# Patient Record
Sex: Female | Born: 2000
Health system: Southern US, Community
[De-identification: ages and names within clinical notes are randomized; demographics above are authoritative.]

## PROBLEM LIST (undated history)

## (undated) ENCOUNTER — Ambulatory Visit

## (undated) ENCOUNTER — Encounter

## (undated) ENCOUNTER — Encounter: Attending: Gastroenterology | Primary: Gastroenterology

## (undated) ENCOUNTER — Telehealth

## (undated) ENCOUNTER — Encounter: Attending: Registered" | Primary: Registered"

## (undated) ENCOUNTER — Ambulatory Visit: Payer: BLUE CROSS/BLUE SHIELD | Attending: Registered" | Primary: Registered"

## (undated) ENCOUNTER — Ambulatory Visit: Payer: MEDICAID

## (undated) ENCOUNTER — Ambulatory Visit: Attending: Surgery | Primary: Surgery

## (undated) ENCOUNTER — Ambulatory Visit: Payer: BLUE CROSS/BLUE SHIELD | Attending: Gastroenterology | Primary: Gastroenterology

## (undated) ENCOUNTER — Ambulatory Visit: Attending: Pharmacist | Primary: Pharmacist

## (undated) DIAGNOSIS — R109 Unspecified abdominal pain: Secondary | ICD-10-CM

## (undated) DIAGNOSIS — K5090 Crohn's disease, unspecified, without complications: Secondary | ICD-10-CM

## (undated) DIAGNOSIS — N83209 Unspecified ovarian cyst, unspecified side: Secondary | ICD-10-CM

## (undated) HISTORY — PX: TIBIA FRACTURE SURGERY: SHX806

---

## 2018-12-18 HISTORY — PX: COLONOSCOPY: SHX174

## 2020-10-13 ENCOUNTER — Other Ambulatory Visit: Payer: Self-pay

## 2020-10-13 ENCOUNTER — Emergency Department (HOSPITAL_BASED_OUTPATIENT_CLINIC_OR_DEPARTMENT_OTHER)
Admission: EM | Admit: 2020-10-13 | Discharge: 2020-10-13 | Disposition: A | Discharge status: Home / Self Care | Payer: Medicaid Other | Attending: Emergency Medicine | Admitting: Emergency Medicine

## 2020-10-13 ENCOUNTER — Encounter (HOSPITAL_BASED_OUTPATIENT_CLINIC_OR_DEPARTMENT_OTHER): Payer: Self-pay | Admitting: Emergency Medicine

## 2020-10-13 DIAGNOSIS — D509 Iron deficiency anemia, unspecified: Secondary | ICD-10-CM | POA: Insufficient documentation

## 2020-10-13 DIAGNOSIS — N7093 Salpingitis and oophoritis, unspecified: Secondary | ICD-10-CM | POA: Diagnosis not present

## 2020-10-13 DIAGNOSIS — R102 Pelvic and perineal pain: Secondary | ICD-10-CM | POA: Diagnosis not present

## 2020-10-13 DIAGNOSIS — R1030 Lower abdominal pain, unspecified: Secondary | ICD-10-CM | POA: Diagnosis present

## 2020-10-13 HISTORY — DX: Unspecified abdominal pain: R10.9

## 2020-10-13 LAB — CBC WITH DIFFERENTIAL/PLATELET
Abs Immature Granulocytes: 0.07 10*3/uL (ref 0.00–0.07)
Basophils Absolute: 0.1 10*3/uL (ref 0.0–0.1)
Basophils Relative: 1 %
Eosinophils Absolute: 0.4 10*3/uL (ref 0.0–0.5)
Eosinophils Relative: 3 %
HCT: 31.6 % — ABNORMAL LOW (ref 36.0–46.0)
Hemoglobin: 9 g/dL — ABNORMAL LOW (ref 12.0–15.0)
Immature Granulocytes: 1 %
Lymphocytes Relative: 15 %
Lymphs Abs: 2 10*3/uL (ref 0.7–4.0)
MCH: 17.1 pg — ABNORMAL LOW (ref 26.0–34.0)
MCHC: 28.5 g/dL — ABNORMAL LOW (ref 30.0–36.0)
MCV: 60 fL — ABNORMAL LOW (ref 80.0–100.0)
Monocytes Absolute: 0.7 10*3/uL (ref 0.1–1.0)
Monocytes Relative: 5 %
Neutro Abs: 9.9 10*3/uL — ABNORMAL HIGH (ref 1.7–7.7)
Neutrophils Relative %: 75 %
Platelets: 864 10*3/uL — ABNORMAL HIGH (ref 150–400)
RBC: 5.27 MIL/uL — ABNORMAL HIGH (ref 3.87–5.11)
RDW: 20.4 % — ABNORMAL HIGH (ref 11.5–15.5)
Smear Review: INCREASED
WBC: 13.2 10*3/uL — ABNORMAL HIGH (ref 4.0–10.5)
nRBC: 0 % (ref 0.0–0.2)

## 2020-10-13 LAB — WET PREP, GENITAL
Sperm: NONE SEEN
Trich, Wet Prep: NONE SEEN
Yeast Wet Prep HPF POC: NONE SEEN

## 2020-10-13 LAB — COMPREHENSIVE METABOLIC PANEL
ALT: 11 U/L (ref 0–44)
AST: 16 U/L (ref 15–41)
Albumin: 3.4 g/dL — ABNORMAL LOW (ref 3.5–5.0)
Alkaline Phosphatase: 65 U/L (ref 38–126)
Anion gap: 11 (ref 5–15)
BUN: 16 mg/dL (ref 6–20)
CO2: 25 mmol/L (ref 22–32)
Calcium: 9.1 mg/dL (ref 8.9–10.3)
Chloride: 100 mmol/L (ref 98–111)
Creatinine, Ser: 0.86 mg/dL (ref 0.44–1.00)
GFR, Estimated: >60 mL/min (ref >60)
Glucose, Bld: 102 mg/dL — ABNORMAL HIGH (ref 70–99)
Potassium: 3.3 mmol/L — ABNORMAL LOW (ref 3.5–5.1)
Sodium: 136 mmol/L (ref 135–145)
Total Bilirubin: 0.2 mg/dL — ABNORMAL LOW (ref 0.3–1.2)
Total Protein: 8.8 g/dL — ABNORMAL HIGH (ref 6.5–8.1)

## 2020-10-13 LAB — URINALYSIS, ROUTINE W REFLEX MICROSCOPIC
Bilirubin Urine: NEGATIVE
Glucose, UA: NEGATIVE mg/dL
Hgb urine dipstick: NEGATIVE
Ketones, ur: NEGATIVE mg/dL
Leukocytes,Ua: NEGATIVE
Nitrite: NEGATIVE
Protein, ur: NEGATIVE mg/dL
Specific Gravity, Urine: >1.03 — ABNORMAL HIGH (ref 1.005–1.030)
pH: 6 (ref 5.0–8.0)

## 2020-10-13 LAB — PREGNANCY, URINE: Preg Test, Ur: NEGATIVE

## 2020-10-13 LAB — LIPASE, BLOOD: Lipase: 31 U/L (ref 11–51)

## 2020-10-13 MED ORDER — ONDANSETRON HCL 4 MG PO TABS: 4.0000 mg | ORAL_TABLET | Freq: Three times a day (TID) | ORAL | 0 refills | Status: DC | PRN

## 2020-10-13 MED ORDER — DOXYCYCLINE HYCLATE 100 MG PO CAPS: 100.0000 mg | ORAL_CAPSULE | Freq: Two times a day (BID) | ORAL | 0 refills | Status: DC

## 2020-10-13 MED ORDER — SODIUM CHLORIDE 0.9 % IV SOLN
1.0000 g | Freq: Once | INTRAVENOUS | Status: AC
  Administered 2020-10-13: 1 g via INTRAVENOUS
  Filled 2020-10-13: qty 10

## 2020-10-13 MED ORDER — FENTANYL CITRATE (PF) 100 MCG/2ML IJ SOLN
50.0000 ug | Freq: Once | INTRAMUSCULAR | Status: AC
  Administered 2020-10-13: 50 ug via INTRAVENOUS
  Filled 2020-10-13: qty 2

## 2020-10-13 MED ORDER — CELECOXIB 200 MG PO CAPS: 200.0000 mg | ORAL_CAPSULE | Freq: Two times a day (BID) | ORAL | 0 refills | Status: DC

## 2020-10-13 MED ORDER — DOXYCYCLINE HYCLATE 100 MG PO TABS
100.0000 mg | ORAL_TABLET | Freq: Once | ORAL | Status: AC
  Administered 2020-10-13: 100 mg via ORAL
  Filled 2020-10-13: qty 1

## 2020-10-13 MED ORDER — ONDANSETRON HCL 4 MG/2ML IJ SOLN
4.0000 mg | Freq: Once | INTRAMUSCULAR | Status: AC
  Administered 2020-10-13: 4 mg via INTRAVENOUS
  Filled 2020-10-13: qty 2

## 2020-10-13 MED ORDER — KETOROLAC TROMETHAMINE 15 MG/ML IJ SOLN
15.0000 mg | Freq: Once | INTRAMUSCULAR | Status: AC
  Administered 2020-10-13: 15 mg via INTRAVENOUS
  Filled 2020-10-13: qty 1

## 2020-10-13 MED FILL — ONDANSETRON HCL 4 MG TABLET: 4 | 3 days supply | Qty: 10 | Fill #0

## 2020-10-13 MED FILL — CELECOXIB 200 MG CAP: 200 | 10 days supply | Qty: 20 | Fill #0

## 2020-10-13 MED FILL — DOXYCYCLINE HYCLATE 100 MG: 100 | 6 days supply | Qty: 13 | Fill #0

## 2020-10-13 NOTE — Discharge Instructions (Signed)
Contact a health care provider if: Medicine does not help your pain. Your pain comes back. You have new symptoms. You have abnormal vaginal discharge or bleeding, including bleeding after menopause. You have a fever or chills. You are constipated. You have blood in your urine or stool. You have foul-smelling urine. You feel weak or light-headed. Get help right away if: You have sudden severe pain. Your pain gets steadily worse. You have severe pain along with fever, nausea, vomiting, or excessive sweating. You lose consciousness.

## 2020-10-13 NOTE — ED Provider Notes (Signed)
[facility] EMERGENCY DEPARTMENT Provider Note   CSN: [id number] Arrival date & time: 10/13/20  3646     History Chief Complaint  Patient presents with  . Abdominal Pain    Rebecca Bell is a 19 y.o. female who presents emergency department with a chief complaint of abdominal pain.  She is new to the area recently moved from [place].  She states that she has about a 2-year history of severe intermittent abdominal pain predominantly in the lower abdomen abdomen.  She has had multiple work-ups with OB/GYN.  She states she has had about 5 CT scans that have been negative.  The patient has had about 1 week of abdominal pain which is worse on the left side but across the entire lower abdomen.  She denies any urinary symptoms but does have pain with defecation.  She states that her stools are soft however that she does not feel that she is completely emptying her bowel.  She states that she does not drink a lot of water and does not have a high-fiber diet or exercise.  She has not been sexually active in multiple months.  She denies a history of pelvic trauma, STI, surgical abortion.  She denies a history of surgeries to her abdomen.  She has had some nausea but denies fevers or chills.  She has no vaginal symptoms.  She has been taking Aleve which does help her pain.  Her sister who is at bedside states that she awoke this morning in tears.  Sister also states that their mother died of renal failure and so they're always a little bit concerned that she may have an issue with her kidneys or urinary system.  HPI     Past Medical History:  Diagnosis Date  . Abdominal pain     There are no problems to display for this patient.   Past Surgical History:  Procedure Laterality Date  . TIBIA FRACTURE SURGERY       OB History   No obstetric history on file.     No family history on file.  Social History   Tobacco Use  . Smoking status: Never Smoker  . Smokeless tobacco: Never  Used  Substance Use Topics  . Alcohol use: Yes    Comment: occ  . Drug use: Not on file    Comment: last time 1 month ago    Home Medications Prior to Admission medications   Not on File    Allergies    Patient has no known allergies.  Review of Systems   Review of Systems Ten systems reviewed and are negative for acute change, except as noted in the HPI.   Physical Exam Updated Vital Signs BP 116/79 (BP Location: Right Arm)   Pulse 89   Temp 98.4 F (36.9 C) (Oral)   Resp 16   Ht 5' 5"  (1.651 m)   Wt 50.8 kg   LMP 09/23/2020   SpO2 100%   BMI 18.64 kg/m   Physical Exam Vitals and nursing note reviewed.  Constitutional:      General: She is not in acute distress.    Appearance: She is well-developed. She is not diaphoretic.  HENT:     Head: Normocephalic and atraumatic.  Eyes:     General: No scleral icterus.    Conjunctiva/sclera: Conjunctivae normal.  Cardiovascular:     Rate and Rhythm: Normal rate and regular rhythm.     Heart sounds: Normal heart sounds. No murmur heard.  No  friction rub. No gallop.   Pulmonary:     Effort: Pulmonary effort is normal. No respiratory distress.     Breath sounds: Normal breath sounds.  Abdominal:     General: Bowel sounds are normal. There is no distension.     Palpations: Abdomen is soft. There is no mass.     Tenderness: There is abdominal tenderness in the right lower quadrant, suprapubic area and left lower quadrant. There is guarding.  Musculoskeletal:     Cervical back: Normal range of motion.     Comments: Pelvic exam: VULVA: normal appearing vulva with no masses, tenderness or lesions, VAGINA: vaginal tenderness, vaginal discharge - white and creamy, CERVIX: normal appearing cervix without discharge or lesions, cervical motion tenderness present, UTERUS: uterus is tender and full, ADNEXA: tenderness bilateral and fullness, exam limited by pain.   Skin:    General: Skin is warm and dry.  Neurological:     Mental  Status: She is alert and oriented to person, place, and time.  Psychiatric:        Behavior: Behavior normal.     ED Results / Procedures / Treatments   Labs (all labs ordered are listed, but only abnormal results are displayed) Labs Reviewed  COMPREHENSIVE METABOLIC PANEL - Abnormal; Notable for the following components:      Result Value   Potassium 3.3 (*)    Glucose, Bld 102 (*)    Total Protein 8.8 (*)    Albumin 3.4 (*)    Total Bilirubin 0.2 (*)    All other components within normal limits  LIPASE, BLOOD  CBC WITH DIFFERENTIAL/PLATELET  URINALYSIS, ROUTINE W REFLEX MICROSCOPIC  PREGNANCY, URINE    EKG None  Radiology No results found.  Procedures Procedures (including critical care time)  Medications Ordered in ED Medications  ketorolac (TORADOL) 15 MG/ML injection 15 mg (15 mg Intravenous Given 10/13/20 0949)    ED Course  I have reviewed the triage vital signs and the nursing notes.  Pertinent labs & imaging results that were available during my care of the patient were reviewed by me and considered in my medical decision making (see chart for details).    MDM Rules/Calculators/A&P                          CC: Pelvic/abdominal pain VS:  Vitals:   10/13/20 0919 10/13/20 0923  BP:  116/79  Pulse:  89  Resp:  16  Temp:  98.4 F (36.9 C)  TempSrc:  Oral  SpO2:  100%  Weight: 50.8 kg   Height: 5' 5"  (1.651 m)     YB:OFBPZWC is gathered by patient and sister at bedside. Previous records obtained and reviewed. DDX:The patient's complaint of pelvic pain involves an extensive number of diagnostic and treatment options, and is a complaint that carries with it a high risk of complications, morbidity, and potential mortality. Given the large differential diagnosis, medical decision making is of high complexity. Differential diagnosis of her lower abdominal considerations include pelvic inflammatory disease, ectopic pregnancy, appendicitis, urinary calculi,  primary dysmenorrhea, septic abortion, ruptured ovarian cyst or tumor, ovarian torsion, tubo-ovarian abscess, degeneration of fibroid, endometriosis, diverticulitis, cystitis.  Labs: I ordered reviewed and interpreted labs which include CBC which is elevated white blood cell count, microcytic anemia with hemoglobin of 9.0, markedly elevated platelet count, abnormal red cell morphology including acanthocytes target cells spherocytes and ovalocytes. Urinalysis without evidence of infection.  CMP shows mild hypokalemia of insignificant  value.  Wet prep shows clue cells and white blood cells present. Imaging: I ordered and reviewed images which included outpatient pelvic ultrasound ordered. I independently visualized and interpreted all imaging. EKG: Consults:Dr. Alvy Bimler I discussed the case with Dr. Simeon Craft such regarding patient's abnormal labs.  She feels that in the setting of her severe pelvic pain and cervical motion tenderness she likely has elevated platelet count as acute phase reactant secondly this may be elevated due to her iron deficiency anemia.  She is offered to see the patient in clinic this coming Friday at 12 PM for follow-up on her lab values.  Patient agrees. MDM: Patient here with severe pelvic pain, given her clinical findings we will treat her for pelvic inflammatory disease.  She was given a gram of IV Rocephin and will be discharged on a week of doxycycline.  Pain improved here in the emergency department.  The patient also has abnormal blood cell findings will have close follow-up with hematology.  Unfortunately our ultrasound tech had an doctor's appointment between the hours of 11 and 3 today here in the emergency department.  I discussed this with the patient and offered her 1 to wait here in the emergency department, to to be transferred to another ED for emergent ultrasound, 3 to return this afternoon between 3 and 5 to have her ultrasound done.  Patient has chosen the third choice  does not wish to stay any longer.  Do not feel that she has high risk for ovarian torsion given her global pain.  She appears otherwise appropriate for discharge at this time. Patient disposition:The patient appears reasonably screened and/or stabilized for discharge and I doubt any other medical condition or other [facility] requiring further screening, evaluation, or treatment in the ED at this time prior to discharge. I have discussed lab and/or imaging findings with the patient and answered all questions/concerns to the best of my ability.I have discussed return precautions and OP follow up.    Final Clinical Impression(s) / ED Diagnoses Final diagnoses:  Pelvic pain  Iron deficiency anemia, unspecified iron deficiency anemia type    Rx / DC Orders ED Discharge Orders    None       Margarita Mail, PA-C 10/13/20 1918    Margette Fast, MD 10/14/20 [phone]

## 2020-10-13 NOTE — ED Notes (Signed)
ED Provider at bedside. 

## 2020-10-13 NOTE — ED Triage Notes (Signed)
LLQ pain and tenderness for a week.  Getting worse.  No vomiting or diarrhea.  Denies vaginal issues.

## 2020-10-14 ENCOUNTER — Encounter (HOSPITAL_BASED_OUTPATIENT_CLINIC_OR_DEPARTMENT_OTHER): Payer: Self-pay | Admitting: *Deleted

## 2020-10-14 ENCOUNTER — Other Ambulatory Visit: Payer: Self-pay

## 2020-10-14 ENCOUNTER — Emergency Department (HOSPITAL_BASED_OUTPATIENT_CLINIC_OR_DEPARTMENT_OTHER)
Admission: RE | Admit: 2020-10-14 | Discharge: 2020-10-14 | Disposition: A | Discharge status: Home / Self Care | Payer: Medicaid Other | Source: Ambulatory Visit | Attending: Emergency Medicine | Admitting: Emergency Medicine

## 2020-10-14 ENCOUNTER — Telehealth: Payer: Self-pay | Admitting: Hematology and Oncology

## 2020-10-14 ENCOUNTER — Inpatient Hospital Stay (HOSPITAL_BASED_OUTPATIENT_CLINIC_OR_DEPARTMENT_OTHER)
Admission: EM | Admit: 2020-10-14 | Discharge: 2020-10-20 | DRG: 759 | Disposition: A | Discharge status: Home / Self Care | Payer: Medicaid Other | Attending: Obstetrics and Gynecology | Admitting: Obstetrics and Gynecology

## 2020-10-14 DIAGNOSIS — Z79899 Other long term (current) drug therapy: Secondary | ICD-10-CM

## 2020-10-14 DIAGNOSIS — D75839 Thrombocytosis, unspecified: Secondary | ICD-10-CM | POA: Diagnosis present

## 2020-10-14 DIAGNOSIS — D509 Iron deficiency anemia, unspecified: Secondary | ICD-10-CM | POA: Diagnosis not present

## 2020-10-14 DIAGNOSIS — Z20822 Contact with and (suspected) exposure to covid-19: Secondary | ICD-10-CM | POA: Diagnosis present

## 2020-10-14 DIAGNOSIS — N7093 Salpingitis and oophoritis, unspecified: Secondary | ICD-10-CM | POA: Diagnosis present

## 2020-10-14 DIAGNOSIS — D649 Anemia, unspecified: Secondary | ICD-10-CM | POA: Diagnosis present

## 2020-10-14 LAB — CBC WITH DIFFERENTIAL/PLATELET
Abs Immature Granulocytes: 0.03 10*3/uL (ref 0.00–0.07)
Basophils Absolute: 0.1 10*3/uL (ref 0.0–0.1)
Basophils Relative: 1 %
Eosinophils Absolute: 0.3 10*3/uL (ref 0.0–0.5)
Eosinophils Relative: 3 %
HCT: 27.5 % — ABNORMAL LOW (ref 36.0–46.0)
Hemoglobin: 7.8 g/dL — ABNORMAL LOW (ref 12.0–15.0)
Immature Granulocytes: 0 %
Lymphocytes Relative: 16 %
Lymphs Abs: 1.4 10*3/uL (ref 0.7–4.0)
MCH: 17.2 pg — ABNORMAL LOW (ref 26.0–34.0)
MCHC: 28.4 g/dL — ABNORMAL LOW (ref 30.0–36.0)
MCV: 60.6 fL — ABNORMAL LOW (ref 80.0–100.0)
Monocytes Absolute: 0.7 10*3/uL (ref 0.1–1.0)
Monocytes Relative: 8 %
Neutro Abs: 6.3 10*3/uL (ref 1.7–7.7)
Neutrophils Relative %: 72 %
Platelets: 814 10*3/uL — ABNORMAL HIGH (ref 150–400)
RBC: 4.54 MIL/uL (ref 3.87–5.11)
RDW: 19.7 % — ABNORMAL HIGH (ref 11.5–15.5)
Smear Review: INCREASED
WBC: 8.7 10*3/uL (ref 4.0–10.5)
nRBC: 0 % (ref 0.0–0.2)

## 2020-10-14 LAB — BASIC METABOLIC PANEL
Anion gap: 10 (ref 5–15)
BUN: 10 mg/dL (ref 6–20)
CO2: 26 mmol/L (ref 22–32)
Calcium: 8.6 mg/dL — ABNORMAL LOW (ref 8.9–10.3)
Chloride: 98 mmol/L (ref 98–111)
Creatinine, Ser: 0.72 mg/dL (ref 0.44–1.00)
GFR, Estimated: >60 mL/min (ref >60)
Glucose, Bld: 92 mg/dL (ref 70–99)
Potassium: 3.3 mmol/L — ABNORMAL LOW (ref 3.5–5.1)
Sodium: 134 mmol/L — ABNORMAL LOW (ref 135–145)

## 2020-10-14 LAB — RESP PANEL BY RT PCR (RSV, FLU A&B, COVID)
Influenza A by PCR: NEGATIVE
Influenza B by PCR: NEGATIVE
Respiratory Syncytial Virus by PCR: NEGATIVE
SARS Coronavirus 2 by RT PCR: NEGATIVE

## 2020-10-14 LAB — GC/CHLAMYDIA PROBE AMP (~~LOC~~) NOT AT ARMC
Chlamydia: NEGATIVE
Comment: NEGATIVE
Comment: NORMAL
Neisseria Gonorrhea: NEGATIVE

## 2020-10-14 MED ORDER — SODIUM CHLORIDE 0.9 % IV SOLN
1.0000 g | Freq: Once | INTRAVENOUS | Status: AC
  Administered 2020-10-14: 1 g via INTRAVENOUS
  Filled 2020-10-14: qty 10

## 2020-10-14 MED ORDER — DOXYCYCLINE HYCLATE 100 MG PO TABS
100.0000 mg | ORAL_TABLET | Freq: Two times a day (BID) | ORAL | Status: DC
  Administered 2020-10-14 – 2020-10-20 (×12): 100 mg via ORAL
  Filled 2020-10-14 (×12): qty 1

## 2020-10-14 MED ORDER — SODIUM CHLORIDE 0.9 % IV SOLN
2.0000 g | Freq: Four times a day (QID) | INTRAVENOUS | Status: DC
  Administered 2020-10-14 – 2020-10-17 (×12): 2 g via INTRAVENOUS
  Filled 2020-10-14 (×16): qty 2

## 2020-10-14 MED ORDER — SODIUM CHLORIDE 0.9 % IV SOLN
100.0000 mg | Freq: Once | INTRAVENOUS | Status: AC
  Administered 2020-10-14: 100 mg via INTRAVENOUS
  Filled 2020-10-14 (×2): qty 100

## 2020-10-14 MED ORDER — OXYCODONE-ACETAMINOPHEN 5-325 MG PO TABS
1.0000 | ORAL_TABLET | ORAL | Status: DC | PRN
  Administered 2020-10-15: 2 via ORAL
  Administered 2020-10-15 – 2020-10-16 (×3): 1 via ORAL
  Administered 2020-10-16 – 2020-10-17 (×2): 2 via ORAL
  Administered 2020-10-17 (×2): 1 via ORAL
  Administered 2020-10-18 – 2020-10-19 (×5): 2 via ORAL
  Administered 2020-10-20: 1 via ORAL
  Administered 2020-10-20: 2 via ORAL
  Filled 2020-10-14 (×2): qty 1
  Filled 2020-10-14: qty 2
  Filled 2020-10-14: qty 1
  Filled 2020-10-14 (×2): qty 2
  Filled 2020-10-14 (×3): qty 1
  Filled 2020-10-14: qty 2
  Filled 2020-10-14: qty 1
  Filled 2020-10-14 (×3): qty 2
  Filled 2020-10-14 (×3): qty 1
  Filled 2020-10-14: qty 2
  Filled 2020-10-14: qty 1

## 2020-10-14 MED ORDER — PRENATAL MULTIVITAMIN CH
1.0000 | ORAL_TABLET | Freq: Every day | ORAL | Status: DC
  Administered 2020-10-15 – 2020-10-17 (×3): 1 via ORAL
  Filled 2020-10-14 (×4): qty 1

## 2020-10-14 MED ORDER — OXYCODONE-ACETAMINOPHEN 5-325 MG PO TABS
1.0000 | ORAL_TABLET | Freq: Once | ORAL | Status: AC
  Administered 2020-10-14: 1 via ORAL
  Filled 2020-10-14: qty 1

## 2020-10-14 MED ORDER — IBUPROFEN 600 MG PO TABS
600.0000 mg | ORAL_TABLET | Freq: Four times a day (QID) | ORAL | Status: DC
  Administered 2020-10-14 – 2020-10-17 (×10): 600 mg via ORAL
  Filled 2020-10-14 (×11): qty 1

## 2020-10-14 NOTE — ED Notes (Signed)
Pt's sister brought meal to visitor, ok per provider

## 2020-10-14 NOTE — ED Notes (Signed)
Ambulated to bathroom with steady gait

## 2020-10-14 NOTE — H&P (Signed)
Rebecca Bell is an 19 y.o. female admitted for the inpatient management of a TOA. Patient reports a 1-week history of left lower quadrant pain. Patient describes the pain as a sharp pain that waxes and wanes. She denies any nausea, emesis, fever or chills. Patient is without any other complaints. Patient reports a long standing history of abdominal pain with work up by [employer] in [place]. Patient reports normal CT scans in the past.     Past Medical History:  Diagnosis Date  . Abdominal pain     Past Surgical History:  Procedure Laterality Date  . TIBIA FRACTURE SURGERY      No family history on file.  Social History:  reports that she has never smoked. She has never used smokeless tobacco. She reports current alcohol use.  Drug: Marijuana.  Allergies: No Known Allergies  Medications Prior to Admission  Medication Sig Dispense Refill Last Dose  . celecoxib (CELEBREX) 200 MG capsule Take 1 capsule (200 mg total) by mouth 2 (two) times daily. 20 capsule 0   . doxycycline (VIBRAMYCIN) 100 MG capsule Take 1 capsule (100 mg total) by mouth 2 (two) times daily. For the next 6 days. Your first dose was given in the ER. Please take your second dose tonight at bedtime with food. 13 capsule 0   . ondansetron (ZOFRAN) 4 MG tablet Take 1 tablet (4 mg total) by mouth every 8 (eight) hours as needed for nausea or vomiting. 10 tablet 0     Review of Systems See pertinent in HPI. All other systems reviewed and negative Blood pressure 113/71, pulse 81, temperature 98 F (36.7 C), temperature source Oral, resp. rate 17, height 5' 5"  (1.651 m), weight 50.8 kg, last menstrual period 09/23/2020, SpO2 100 %. Physical Exam GENERAL: Well-developed, well-nourished female in no acute distress.  HEENT: Normocephalic, atraumatic. Sclerae anicteric.  NECK: Supple. Normal thyroid.  LUNGS: Clear to auscultation bilaterally.  HEART: Regular rate and rhythm. BREASTS: Symmetric in size. No palpable masses or  lymphadenopathy, skin changes, or nipple drainage. ABDOMEN: Soft, diffuse lower abdominal tenderness, nondistended. No rebound, no guarding.  EXTREMITIES: No cyanosis, clubbing, or edema, 2+ distal pulses.  Results for orders placed or performed during the hospital encounter of 10/14/20 (from the past 24 hour(s))  CBC with Differential     Status: Abnormal   Collection Time: 10/14/20  3:01 PM  Result Value Ref Range   WBC 8.7 4.0 - 10.5 K/uL   RBC 4.54 3.87 - 5.11 MIL/uL   Hemoglobin 7.8 (L) 12.0 - 15.0 g/dL   HCT 27.5 (L) 36 - 46 %   MCV 60.6 (L) 80.0 - 100.0 fL   MCH 17.2 (L) 26.0 - 34.0 pg   MCHC 28.4 (L) 30.0 - 36.0 g/dL   RDW 19.7 (H) 11.5 - 15.5 %   Platelets 814 (H) 150 - 400 K/uL   nRBC 0.0 0.0 - 0.2 %   Neutrophils Relative % 72 %   Neutro Abs 6.3 1.7 - 7.7 K/uL   Lymphocytes Relative 16 %   Lymphs Abs 1.4 0.7 - 4.0 K/uL   Monocytes Relative 8 %   Monocytes Absolute 0.7 0.1 - 1.0 K/uL   Eosinophils Relative 3 %   Eosinophils Absolute 0.3 0.0 - 0.5 K/uL   Basophils Relative 1 %   Basophils Absolute 0.1 0.0 - 0.1 K/uL   WBC Morphology MORPHOLOGY UNREMARKABLE    Smear Review PLATELETS APPEAR INCREASED    Immature Granulocytes 0 %   Abs Immature Granulocytes  0.03 0.00 - 0.07 K/uL   Acanthocytes PRESENT    Target Cells PRESENT    Ovalocytes PRESENT   Basic metabolic panel     Status: Abnormal   Collection Time: 10/14/20  3:01 PM  Result Value Ref Range   Sodium 134 (L) 135 - 145 mmol/L   Potassium 3.3 (L) 3.5 - 5.1 mmol/L   Chloride 98 98 - 111 mmol/L   CO2 26 22 - 32 mmol/L   Glucose, Bld 92 70 - 99 mg/dL   BUN 10 6 - 20 mg/dL   Creatinine, Ser 0.72 0.44 - 1.00 mg/dL   Calcium 8.6 (L) 8.9 - 10.3 mg/dL   GFR, Estimated >60 >60 mL/min   Anion gap 10 5 - 15  Resp Panel by RT PCR (RSV, Flu A&B, Covid) - Nasopharyngeal Swab     Status: None   Collection Time: 10/14/20  3:01 PM   Specimen: Nasopharyngeal Swab  Result Value Ref Range   SARS Coronavirus 2 by RT PCR  NEGATIVE NEGATIVE   Influenza A by PCR NEGATIVE NEGATIVE   Influenza B by PCR NEGATIVE NEGATIVE   Respiratory Syncytial Virus by PCR NEGATIVE NEGATIVE    US PELVIC DOPPLER (TORSION R/O OR MASS ARTERIAL FLOW)  Result Date: 10/14/2020 CLINICAL DATA:  Severe pelvic pain for 1 week. Last menstrual period 09/23/2020. EXAM: TRANSABDOMINAL ULTRASOUND OF PELVIS DOPPLER ULTRASOUND OF OVARIES TECHNIQUE: Transabdominal ultrasound examination of the pelvis was performed including evaluation of the uterus, ovaries, adnexal regions, and pelvic cul-de-sac. Color and duplex Doppler ultrasound was utilized to evaluate blood flow to the ovaries. COMPARISON:  None. FINDINGS: Measurements: 6.3 x 2.4 x 3.4 cm = volume: 27 mL. No fibroids or other mass visualized. Endometrium Thickness: 3 mm.  No focal abnormality visualized. Right ovary Measurements: 5.9 x 4.1 x 4.3 cm = volume: 54 mL. A right adnexal mass has multiple thin septations and measures 10.0 x 6.1 x 7.2 cm. Left ovary Measurements: 5.0 x 4.7 x 5.3 cm = volume: 65 mL. The left ovary has a heterogeneous appearance. Pulsed Doppler evaluation demonstrates normal low-resistance arterial and venous waveforms in both ovaries. Other findings: A heterogeneous soft tissue mass superior to the uterus measures 8.0 x 4.8 x 8.6 cm and demonstrates internal blood flow. Small volume free fluid is seen in the pelvis. IMPRESSION: 1. Enlarged and heterogeneous appearing left ovary may reflect infection or torsion. 2. Large cystic mass in the vicinity of the right adnexa may reflect hydrosalpinx, pyosalpinx, or an adnexal cyst with multiple thin septations. 3. Heterogeneous soft tissue mass superior to the uterus is nonspecific and may reflect a tubo-ovarian abscess. These results were called by telephone at the time of interpretation on 10/14/2020 at 2:12 pm to the patient's provider, who verbally acknowledged these results. Electronically Signed   By: Zerita Boers M.D.   On:  10/14/2020 14:12    Assessment/Plan: 19 yo with right TOA - Admit for IV antibiotics - pain management as needed - Consider IR drainage if no improvement in her symptoms  [name] 10/14/2020, 6:53 PM

## 2020-10-14 NOTE — ED Provider Notes (Signed)
[facility] EMERGENCY DEPARTMENT Provider Note   CSN: [id number] Arrival date & time: 10/14/20  1421     History Chief Complaint  Patient presents with  . Abdominal Pain    Rebecca Bell is a 19 y.o. female.  HPI    19 year old female presenting to the emergency department today for evaluation of abdominal pain and abnormal ultrasound.  Patient was seen in the emergency department yesterday for evaluation of abdominal pain.  Has history of chronic diffuse abdominal pain but for the last week pain has been new and different and located to the bilateral lower abdomen.  Pain is waxing and waning in nature.  She currently rates pain 4/10.  She is been taking over-the-counter medications with some mild relief.  She denies any associated nausea, vomiting, diarrhea, urinary or vaginal complaints.  She has had some pain with defecation.  Denies any fevers.  She has had an extensive GI and OB/GYN work-up for her chronic abdominal pain in the past including EGD, colonoscopy and pelvic ultrasound which she states were all normal.   Patient was evaluated yesterday for her symptoms in the emergency department and had an ultrasound ordered that resulted today and was abnormal so she was sent to the ED for further evaluation.  Past Medical History:  Diagnosis Date  . Abdominal pain     Patient Active Problem List   Diagnosis Date Noted  . Tubo-ovarian abscess 10/14/2020    Past Surgical History:  Procedure Laterality Date  . TIBIA FRACTURE SURGERY       OB History   No obstetric history on file.     No family history on file.  Social History   Tobacco Use  . Smoking status: Never Smoker  . Smokeless tobacco: Never Used  Substance Use Topics  . Alcohol use: Yes    Comment: occ  . Drug use: Not on file    Comment: last time 1 month ago    Home Medications Prior to Admission medications   Medication Sig Start Date End Date Taking? Authorizing Provider  celecoxib  (CELEBREX) 200 MG capsule Take 1 capsule (200 mg total) by mouth 2 (two) times daily. 10/13/20   Margarita Mail, PA-C  doxycycline (VIBRAMYCIN) 100 MG capsule Take 1 capsule (100 mg total) by mouth 2 (two) times daily. For the next 6 days. Your first dose was given in the ER. Please take your second dose tonight at bedtime with food. 10/13/20   Harris, Abigail, PA-C  ondansetron (ZOFRAN) 4 MG tablet Take 1 tablet (4 mg total) by mouth every 8 (eight) hours as needed for nausea or vomiting. 10/13/20   Margarita Mail, PA-C    Allergies    Patient has no known allergies.  Review of Systems   Review of Systems  Constitutional: Negative for chills and fever.  HENT: Negative for ear pain and sore throat.   Eyes: Negative for visual disturbance.  Respiratory: Negative for cough and shortness of breath.   Cardiovascular: Negative for chest pain.  Gastrointestinal: Positive for abdominal pain. Negative for constipation, diarrhea, nausea and vomiting.  Genitourinary: Positive for pelvic pain. Negative for dysuria and hematuria.  Musculoskeletal: Negative for back pain.  Skin: Negative for rash.  Neurological: Negative for headaches.  All other systems reviewed and are negative.   Physical Exam Updated Vital Signs BP 125/69   Pulse 98   Temp 98.2 F (36.8 C) (Oral)   Resp 20   Ht 5' 5"  (1.651 m)   Wt 50.8  kg   LMP 09/23/2020   SpO2 100%   BMI 18.64 kg/m   Physical Exam Vitals and nursing note reviewed.  Constitutional:      General: She is not in acute distress.    Appearance: She is well-developed.  HENT:     Head: Normocephalic and atraumatic.  Eyes:     Conjunctiva/sclera: Conjunctivae normal.  Cardiovascular:     Rate and Rhythm: Normal rate and regular rhythm.     Heart sounds: Normal heart sounds. No murmur heard.   Pulmonary:     Effort: Pulmonary effort is normal. No respiratory distress.     Breath sounds: Normal breath sounds. No wheezing, rhonchi or rales.    Abdominal:     General: Bowel sounds are normal.     Palpations: Abdomen is soft.     Tenderness: There is abdominal tenderness in the right lower quadrant, suprapubic area and left lower quadrant. There is guarding. There is no rebound.  Genitourinary:    Comments: Deferred. Pt had pelvic examination yesterday. Musculoskeletal:     Cervical back: Neck supple.  Skin:    General: Skin is warm and dry.  Neurological:     Mental Status: She is alert.     ED Results / Procedures / Treatments   Labs (all labs ordered are listed, but only abnormal results are displayed) Labs Reviewed  RESP PANEL BY RT PCR (RSV, FLU A&B, COVID)  CBC WITH DIFFERENTIAL/PLATELET  BASIC METABOLIC PANEL    EKG None  Radiology US PELVIC DOPPLER (TORSION R/O OR MASS ARTERIAL FLOW)  Result Date: 10/14/2020 CLINICAL DATA:  Severe pelvic pain for 1 week. Last menstrual period 09/23/2020. EXAM: TRANSABDOMINAL ULTRASOUND OF PELVIS DOPPLER ULTRASOUND OF OVARIES TECHNIQUE: Transabdominal ultrasound examination of the pelvis was performed including evaluation of the uterus, ovaries, adnexal regions, and pelvic cul-de-sac. Color and duplex Doppler ultrasound was utilized to evaluate blood flow to the ovaries. COMPARISON:  None. FINDINGS: Measurements: 6.3 x 2.4 x 3.4 cm = volume: 27 mL. No fibroids or other mass visualized. Endometrium Thickness: 3 mm.  No focal abnormality visualized. Right ovary Measurements: 5.9 x 4.1 x 4.3 cm = volume: 54 mL. A right adnexal mass has multiple thin septations and measures 10.0 x 6.1 x 7.2 cm. Left ovary Measurements: 5.0 x 4.7 x 5.3 cm = volume: 65 mL. The left ovary has a heterogeneous appearance. Pulsed Doppler evaluation demonstrates normal low-resistance arterial and venous waveforms in both ovaries. Other findings: A heterogeneous soft tissue mass superior to the uterus measures 8.0 x 4.8 x 8.6 cm and demonstrates internal blood flow. Small volume free fluid is seen in the pelvis.  IMPRESSION: 1. Enlarged and heterogeneous appearing left ovary may reflect infection or torsion. 2. Large cystic mass in the vicinity of the right adnexa may reflect hydrosalpinx, pyosalpinx, or an adnexal cyst with multiple thin septations. 3. Heterogeneous soft tissue mass superior to the uterus is nonspecific and may reflect a tubo-ovarian abscess. These results were called by telephone at the time of interpretation on 10/14/2020 at 2:12 pm to the patient's provider, who verbally acknowledged these results. Electronically Signed   By: Zerita Boers M.D.   On: 10/14/2020 14:12    Procedures Procedures (including critical care time)  Medications Ordered in ED Medications  cefTRIAXone (ROCEPHIN) 1 g in sodium chloride 0.9 % 100 mL IVPB (has no administration in time range)  doxycycline (VIBRAMYCIN) 100 mg in sodium chloride 0.9 % 250 mL IVPB (has no administration in time range)  oxyCODONE-acetaminophen (PERCOCET/ROXICET) 5-325 MG per tablet 1 tablet (has no administration in time range)    ED Course  I have reviewed the triage vital signs and the nursing notes.  Pertinent labs & imaging results that were available during my care of the patient were reviewed by me and considered in my medical decision making (see chart for details).    MDM Rules/Calculators/A&P                          19 year old female presenting for evaluation of 1 week history of waxing and waning pelvic pain.  No other associated GI/GU complaints except for pain with defecation.  Pelvic ultrasound yesterday revealed cervical motion tenderness and bilateral adnexal tenderness.  She was given ceftriaxone in the ED and started on a course of doxycycline to treat PID.  Pelvic US report shows:  1. Enlarged and heterogeneous appearing left ovary may reflect infection or torsion. 2. Large cystic mass in the vicinity of the right adnexa may reflect hydrosalpinx, pyosalpinx, or an adnexal cyst with multiple thin septations. 3.  Heterogeneous soft tissue mass superior to the uterus is nonspecific and may reflect a tubo-ovarian abscess.  Reviewed labs from yesterday which shows   2:49 PM CONSULT with Dr. Roselie Awkward with OB-GYN. He recommends IV abx and he will admit the patient.   Pt given ceftriaxone and doxycycline in the ED. Also given analgesics. Discussed plan to admission and she is in agreement. Will be transferred to [facility].    Final Clinical Impression(s) / ED Diagnoses Final diagnoses:  Tubo-ovarian abscess    Rx / DC Orders ED Discharge Orders    None       Rodney Booze, PA-C 10/14/20 [place], [place], DO 10/18/20 2300

## 2020-10-14 NOTE — Telephone Encounter (Signed)
A new hem appt has been scheduled for Ms. Thang to see Dr. Alvy Bimler on 10/29 at 12pm. Pt aware to arrive 30 minutes early.

## 2020-10-14 NOTE — ED Triage Notes (Signed)
She was seen here yesterday for LLQ pain for a week. We scheduled her for an out pt [place]. She had the [place] this am that is abnormal and required her to be readmitted to the ED.

## 2020-10-15 ENCOUNTER — Inpatient Hospital Stay: Payer: Self-pay | Admitting: Hematology and Oncology

## 2020-10-15 MED ORDER — POLYETHYLENE GLYCOL 3350 17 G PO PACK
17.0000 g | PACK | Freq: Once | ORAL | Status: AC
  Administered 2020-10-15: 17 g via ORAL
  Filled 2020-10-15: qty 1

## 2020-10-15 NOTE — Progress Notes (Signed)
Subjective:low abdominal pain, feels she needs to have BM but is uncomfortable Patient reports good appetite, no nausea  Objective: I have reviewed patient's vital signs, medications, labs and radiology results. Blood pressure 110/70, pulse 80, temperature 98 F (36.7 C), temperature source Oral, resp. rate 17, height 5' 5"  (1.651 m), weight 50.8 kg, last menstrual period 09/23/2020, SpO2 100 %.  General: alert, cooperative and no distress Resp: normal effort GI: soft, non-tender; bowel sounds normal; no masses,  no organomegaly Extremities: extremities normal, atraumatic, no cyanosis or edema   Assessment/Plan: Treated for likely TOA, discussed that IR may be consulted if she does not improve after 24-48 hr on IV ABX Miralax for constipation   LOS: 1 day    Emeterio Reeve 10/15/2020, 11:27 AM

## 2020-10-16 MED ORDER — DIPHENHYDRAMINE HCL 25 MG PO CAPS
25.0000 mg | ORAL_CAPSULE | Freq: Four times a day (QID) | ORAL | Status: DC | PRN
  Administered 2020-10-16 – 2020-10-20 (×10): 25 mg via ORAL
  Filled 2020-10-16 (×10): qty 1

## 2020-10-17 ENCOUNTER — Inpatient Hospital Stay (HOSPITAL_COMMUNITY): Payer: Medicaid Other

## 2020-10-17 ENCOUNTER — Encounter (HOSPITAL_COMMUNITY): Payer: Self-pay | Admitting: Obstetrics and Gynecology

## 2020-10-17 DIAGNOSIS — D649 Anemia, unspecified: Secondary | ICD-10-CM | POA: Diagnosis present

## 2020-10-17 DIAGNOSIS — D75839 Thrombocytosis, unspecified: Secondary | ICD-10-CM | POA: Diagnosis present

## 2020-10-17 DIAGNOSIS — R1900 Intra-abdominal and pelvic swelling, mass and lump, unspecified site: Secondary | ICD-10-CM

## 2020-10-17 LAB — CBC
HCT: 26.6 % — ABNORMAL LOW (ref 36.0–46.0)
HCT: 31.4 % — ABNORMAL LOW (ref 36.0–46.0)
Hemoglobin: 7.2 g/dL — ABNORMAL LOW (ref 12.0–15.0)
Hemoglobin: 8.7 g/dL — ABNORMAL LOW (ref 12.0–15.0)
MCH: 16.7 pg — ABNORMAL LOW (ref 26.0–34.0)
MCH: 16.9 pg — ABNORMAL LOW (ref 26.0–34.0)
MCHC: 27.1 g/dL — ABNORMAL LOW (ref 30.0–36.0)
MCHC: 27.7 g/dL — ABNORMAL LOW (ref 30.0–36.0)
MCV: 61.6 fL — ABNORMAL LOW (ref 80.0–100.0)
MCV: 60.9 fL — ABNORMAL LOW (ref 80.0–100.0)
Platelets: 758 10*3/uL — ABNORMAL HIGH (ref 150–400)
Platelets: 919 10*3/uL (ref 150–400)
RBC: 4.32 MIL/uL (ref 3.87–5.11)
RBC: 5.16 MIL/uL — ABNORMAL HIGH (ref 3.87–5.11)
RDW: 19.4 % — ABNORMAL HIGH (ref 11.5–15.5)
RDW: 20.1 % — ABNORMAL HIGH (ref 11.5–15.5)
WBC: 9.3 10*3/uL (ref 4.0–10.5)
WBC: 10.2 10*3/uL (ref 4.0–10.5)
nRBC: 0 % (ref 0.0–0.2)
nRBC: 0 % (ref 0.0–0.2)

## 2020-10-17 MED ORDER — POLYSACCHARIDE IRON COMPLEX 150 MG PO CAPS
150.0000 mg | ORAL_CAPSULE | Freq: Every day | ORAL | Status: DC
  Administered 2020-10-17: 150 mg via ORAL
  Filled 2020-10-17 (×2): qty 1

## 2020-10-17 MED ORDER — POTASSIUM CHLORIDE CRYS ER 20 MEQ PO TBCR
20.0000 meq | EXTENDED_RELEASE_TABLET | Freq: Two times a day (BID) | ORAL | Status: AC
  Administered 2020-10-17 – 2020-10-19 (×6): 20 meq via ORAL
  Filled 2020-10-17 (×6): qty 1

## 2020-10-17 MED ORDER — ENOXAPARIN SODIUM 40 MG/0.4ML ~~LOC~~ SOLN
40.0000 mg | SUBCUTANEOUS | Status: DC
  Administered 2020-10-17: 40 mg via SUBCUTANEOUS
  Filled 2020-10-17: qty 0.4

## 2020-10-17 MED ORDER — POLYETHYLENE GLYCOL 3350 17 G PO PACK
17.0000 g | PACK | Freq: Every day | ORAL | Status: DC
  Filled 2020-10-17: qty 1

## 2020-10-17 MED ORDER — METRONIDAZOLE 500 MG PO TABS
500.0000 mg | ORAL_TABLET | Freq: Two times a day (BID) | ORAL | Status: DC
  Administered 2020-10-17 – 2020-10-20 (×6): 500 mg via ORAL
  Filled 2020-10-17 (×6): qty 1

## 2020-10-17 MED ORDER — IBUPROFEN 600 MG PO TABS
600.0000 mg | ORAL_TABLET | Freq: Four times a day (QID) | ORAL | Status: DC | PRN
  Filled 2020-10-17: qty 1

## 2020-10-17 MED ORDER — GADOBUTROL 1 MMOL/ML IV SOLN
5.0000 mL | Freq: Once | INTRAVENOUS | Status: AC | PRN
  Administered 2020-10-17: 5 mL via INTRAVENOUS

## 2020-10-17 NOTE — Progress Notes (Signed)
Name: Samella Lucchetti Date: 10/17/2020 MRN: [id number]  DOB: [birth year]  Primary Care Physician: Patient, No Pcp Per  Referring Physician: No ref. provider found  HISTORY OF PRESENT ILLNESS: pt continues to have lower abd pain, tolerated PO, denies subjective fever/chills.   PAST MEDICAL HISTORY: Past Medical History:  Diagnosis Date  . Abdominal pain     PAST SURGICAL HISTORY:  Past Surgical History:  Procedure Laterality Date  . TIBIA FRACTURE SURGERY      FAMILY HISTORY: No family history on file.  SOCIAL HISTORY:  reports that she has never smoked. She has never used smokeless tobacco. She reports current alcohol use.  Drug: Marijuana.  ALLERGIES:  has No Known Allergies.  MEDICATIONS:  Current Facility-Administered Medications  Medication Dose Route Frequency Provider Last Rate Last Admin  . cefOXitin (MEFOXIN) 2 g in sodium chloride 0.9 % 100 mL IVPB  2 g Intravenous Q6H Constant, Peggy, MD   Stopped at 10/17/20 0044  . diphenhydrAMINE (BENADRYL) capsule 25 mg  25 mg Oral Q6H PRN Griffin Basil, MD   25 mg at 10/17/20 0153  . doxycycline (VIBRA-TABS) tablet 100 mg  100 mg Oral Q12H Constant, Peggy, MD   100 mg at 10/16/20 2231  . ibuprofen (ADVIL) tablet 600 mg  600 mg Oral Q6H Constant, Peggy, MD   600 mg at 10/17/20 0153  . oxyCODONE-acetaminophen (PERCOCET/ROXICET) 5-325 MG per tablet 1-2 tablet  1-2 tablet Oral Q3H PRN Constant, Peggy, MD   2 tablet at 10/16/20 2232  . prenatal multivitamin tablet 1 tablet  1 tablet Oral Q1200 Constant, Peggy, MD   1 tablet at 10/16/20 1214     PHYSICAL EXAMINATION: BP 100/62 (BP Location: Right Arm)   Pulse 92   Temp 97.7 F (36.5 C) (Oral)   Resp 18   Ht 5' 5"  (1.651 m)   Wt 50.8 kg   LMP 09/23/2020   SpO2 100%   BMI 18.64 kg/m    System Exam: General appearance: alert and cooperative Head: Normocephalic, without obvious abnormality, atraumatic GI: soft, non-tender; bowel sounds normal; no masses,  no  organomegaly Extremities: extremities normal, atraumatic, no cyanosis or edema  LABORATORY DATA: Results for orders placed or performed during the hospital encounter of 10/14/20 (from the past 72 hour(s))  CBC with Differential     Status: Abnormal   Collection Time: 10/14/20  3:01 PM  Result Value Ref Range   WBC 8.7 4.0 - 10.5 K/uL   RBC 4.54 3.87 - 5.11 MIL/uL   Hemoglobin 7.8 (L) 12.0 - 15.0 g/dL    Comment: Reticulocyte Hemoglobin testing may be clinically indicated, consider ordering this additional test [id number]    HCT 27.5 (L) 36 - 46 %   MCV 60.6 (L) 80.0 - 100.0 fL   MCH 17.2 (L) 26.0 - 34.0 pg   MCHC 28.4 (L) 30.0 - 36.0 g/dL   RDW 19.7 (H) 11.5 - 15.5 %   Platelets 814 (H) 150 - 400 K/uL    Comment: REPEATED TO VERIFY   nRBC 0.0 0.0 - 0.2 %   Neutrophils Relative % 72 %   Neutro Abs 6.3 1.7 - 7.7 K/uL   Lymphocytes Relative 16 %   Lymphs Abs 1.4 0.7 - 4.0 K/uL   Monocytes Relative 8 %   Monocytes Absolute 0.7 0.1 - 1.0 K/uL   Eosinophils Relative 3 %   Eosinophils Absolute 0.3 0.0 - 0.5 K/uL   Basophils Relative 1 %   Basophils Absolute 0.1 0.0 - 0.1 K/uL  WBC Morphology MORPHOLOGY UNREMARKABLE    Smear Review PLATELETS APPEAR INCREASED     Comment: PLATELET COUNT CONFIRMED BY SMEAR   Immature Granulocytes 0 %   Abs Immature Granulocytes 0.03 0.00 - 0.07 K/uL   Acanthocytes PRESENT    Target Cells PRESENT    Ovalocytes PRESENT     Comment: Performed at [facility], [address] [place], [place] [address]  Basic metabolic panel     Status: Abnormal   Collection Time: 10/14/20  3:01 PM  Result Value Ref Range   Sodium 134 (L) 135 - 145 mmol/L   Potassium 3.3 (L) 3.5 - 5.1 mmol/L   Chloride 98 98 - 111 mmol/L   CO2 26 22 - 32 mmol/L   Glucose, Bld 92 70 - 99 mg/dL    Comment: Glucose reference range applies only to samples taken after fasting for at least 8 hours.   BUN 10 6 - 20 mg/dL   Creatinine, Ser 0.72 0.44 - 1.00 mg/dL   Calcium  8.6 (L) 8.9 - 10.3 mg/dL   GFR, Estimated >60 >60 mL/min    Comment: (NOTE) Calculated using the CKD-EPI Creatinine Equation (2021)    Anion gap 10 5 - 15    Comment: Performed at [facility], [address] [place], [place] [address]  Resp Panel by RT PCR (RSV, Flu A&B, Covid) - Nasopharyngeal Swab     Status: None   Collection Time: 10/14/20  3:01 PM   Specimen: Nasopharyngeal Swab  Result Value Ref Range   SARS Coronavirus 2 by RT PCR NEGATIVE NEGATIVE    Comment: (NOTE) SARS-CoV-2 target nucleic acids are NOT DETECTED.  The SARS-CoV-2 RNA is generally detectable in upper respiratoy specimens during the acute phase of infection. The lowest concentration of SARS-CoV-2 viral copies this assay can detect is 131 copies/mL. A negative result does not preclude SARS-Cov-2 infection and should not be used as the sole basis for treatment or other patient management decisions. A negative result may occur with  improper specimen collection/handling, submission of specimen other than nasopharyngeal swab, presence of viral mutation(s) within the areas targeted by this assay, and inadequate number of viral copies (<131 copies/mL). A negative result must be combined with clinical observations, patient history, and epidemiological information. The expected result is Negative.  Fact Sheet for Patients:  PinkCheek.be  Fact Sheet for Healthcare Providers:  GravelBags.it  This test is no t yet approved or cleared by the [place] FDA and  has been authorized for detection and/or diagnosis of SARS-CoV-2 by FDA under an Emergency Use Authorization (EUA). This EUA will remain  in effect (meaning this test can be used) for the duration of the COVID-19 declaration under Section 564(b)(1) of the Act, 21 U.S.C. section 360bbb-3(b)(1), unless the authorization is terminated or revoked sooner.     Influenza A by PCR NEGATIVE  NEGATIVE   Influenza B by PCR NEGATIVE NEGATIVE    Comment: (NOTE) The Xpert Xpress SARS-CoV-2/FLU/RSV assay is intended as an aid in  the diagnosis of influenza from Nasopharyngeal swab specimens and  should not be used as a sole basis for treatment. Nasal washings and  aspirates are unacceptable for Xpert Xpress SARS-CoV-2/FLU/RSV  testing.  Fact Sheet for Patients: PinkCheek.be  Fact Sheet for Healthcare Providers: GravelBags.it  This test is not yet approved or cleared by the [place] FDA and  has been authorized for detection and/or diagnosis of SARS-CoV-2 by  FDA under an Emergency Use  Authorization (EUA). This EUA will remain  in effect (meaning this test can be used) for the duration of the  Covid-19 declaration under Section 564(b)(1) of the Act, 21  U.S.C. section 360bbb-3(b)(1), unless the authorization is  terminated or revoked.    Respiratory Syncytial Virus by PCR NEGATIVE NEGATIVE    Comment: (NOTE) Fact Sheet for Patients: PinkCheek.be  Fact Sheet for Healthcare Providers: GravelBags.it  This test is not yet approved or cleared by the [place] FDA and  has been authorized for detection and/or diagnosis of SARS-CoV-2 by  FDA under an Emergency Use Authorization (EUA). This EUA will remain  in effect (meaning this test can be used) for the duration of the  COVID-19 declaration under Section 564(b)(1) of the Act, 21 U.S.C.  section 360bbb-3(b)(1), unless the authorization is terminated or  revoked. Performed at [facility], [address] [place], [place] [address]      RADIOGRAPHIC STUDIES: US PELVIC DOPPLER (TORSION R/O OR MASS ARTERIAL FLOW)  Result Date: 10/14/2020 CLINICAL DATA:  Severe pelvic pain for 1 week. Last menstrual period 09/23/2020. EXAM: TRANSABDOMINAL ULTRASOUND OF PELVIS DOPPLER ULTRASOUND OF OVARIES  TECHNIQUE: Transabdominal ultrasound examination of the pelvis was performed including evaluation of the uterus, ovaries, adnexal regions, and pelvic cul-de-sac. Color and duplex Doppler ultrasound was utilized to evaluate blood flow to the ovaries. COMPARISON:  None. FINDINGS: Measurements: 6.3 x 2.4 x 3.4 cm = volume: 27 mL. No fibroids or other mass visualized. Endometrium Thickness: 3 mm.  No focal abnormality visualized. Right ovary Measurements: 5.9 x 4.1 x 4.3 cm = volume: 54 mL. A right adnexal mass has multiple thin septations and measures 10.0 x 6.1 x 7.2 cm. Left ovary Measurements: 5.0 x 4.7 x 5.3 cm = volume: 65 mL. The left ovary has a heterogeneous appearance. Pulsed Doppler evaluation demonstrates normal low-resistance arterial and venous waveforms in both ovaries. Other findings: A heterogeneous soft tissue mass superior to the uterus measures 8.0 x 4.8 x 8.6 cm and demonstrates internal blood flow. Small volume free fluid is seen in the pelvis. IMPRESSION: 1. Enlarged and heterogeneous appearing left ovary may reflect infection or torsion. 2. Large cystic mass in the vicinity of the right adnexa may reflect hydrosalpinx, pyosalpinx, or an adnexal cyst with multiple thin septations. 3. Heterogeneous soft tissue mass superior to the uterus is nonspecific and may reflect a tubo-ovarian abscess. These results were called by telephone at the time of interpretation on 10/14/2020 at 2:12 pm to the patient's provider, who verbally acknowledged these results. Electronically Signed   By: Zerita Boers M.D.   On: 10/14/2020 14:12     ASSESSMENT:  18yo with TOA apprxo 8-9 cm   PLAN: Continue IV abx, follow CBC, may re image tomorrow and possibly discuss IR drain placement.  LOS: 2 day

## 2020-10-17 NOTE — Progress Notes (Signed)
CRITICAL VALUE ALERT  Critical Value:  Platelet 919  Date & Time Notied: 10/17/2020 @ 0180  Provider Notified: Ilda Basset  Orders Received/Actions taken: orders placed

## 2020-10-17 NOTE — Progress Notes (Signed)
GYN Note D/w radiology and difficult to interpret exactly what's going; the radiologist on call is actually who read the initial u/s. He recommended either repeat u/s or a pelvic MRI.   Will start daily iron, flagyl 500 bid, lovenox 40 qday and d/c cefoxitin. Pelvic mri ordered for today or tomorrow. Her cbc came back better on it's own. Should be fine to go tomorrow pending still doing fine on just po abx and after mri is done  Can also d/w hematology tomorrow re: her very elevated plt count  Durene Romans MD Attending Center for [place] ([place]) [place] Phone: [phone] (M-F, 0800-1700) & [phone] (Off hours, weekends, holidays) Time: 1810

## 2020-10-17 NOTE — Progress Notes (Addendum)
Gynecology Progress Note  Admission Date: 10/14/2020 Current Date: 10/17/2020 10:25 AM  Rebecca Bell is a 19 y.o. G0 HD#4 admitted for abdominal pain, presumed TOA on imaging   History complicated by: Patient Active Problem List   Diagnosis Date Noted  . Tubo-ovarian abscess 10/14/2020  . TOA (tubo-ovarian abscess) 10/14/2020    ROS and patient/family/surgical history, located on admission H&P note dated 10/14/2020, have been reviewed, and there are no changes except as noted below Yesterday/Overnight Events:  none  Subjective:  Pain feels better this morning; achy all the time but sharp pains come and go.  No s/s of anemia, VB. +BM, ambulation, voiding. No nausea, vomiting, fevers, chills.   Objective:    Current Vital Signs 24h Vital Sign Ranges  T (!) 97.5 F (36.4 C) Temp  Avg: 97.8 F (36.6 C)  Min: 97.5 F (36.4 C)  Max: 98.3 F (36.8 C)  BP (!) 102/59 BP  Min: 96/52  Max: 102/59  HR 72 Pulse  Avg: 84  Min: 72  Max: 92  RR 16 Resp  Avg: 16.7  Min: 16  Max: 18  SaO2 100 % Room Air SpO2  Avg: 100 %  Min: 100 %  Max: 100 %       24 Hour I/O Current Shift I/O  Time Ins Outs 10/30 0701 - 10/31 0700 In: 1300 [P.O.:900] Out: -  No intake/output data recorded.    Physical exam: General appearance: alert, cooperative and appears stated age Abdomen: +BS, soft, ND. Minimally ttp in suprapubic area, no peritoneal s/s Lungs: clear to auscultation bilaterally Heart: S1, S2 normal, no murmur, rub or gallop, regular rate and rhythm Extremities: no c/c/e Skin: warm and dry Psych: appropriate Neurologic: Grossly normal  Medications Current Facility-Administered Medications  Medication Dose Route Frequency Provider Last Rate Last Admin  . cefOXitin (MEFOXIN) 2 g in sodium chloride 0.9 % 100 mL IVPB  2 g Intravenous Q6H Constant, Peggy, MD   Stopped at 10/17/20 0600  . diphenhydrAMINE (BENADRYL) capsule 25 mg  25 mg Oral Q6H PRN Griffin Basil, MD   25 mg at 10/17/20 0850   . doxycycline (VIBRA-TABS) tablet 100 mg  100 mg Oral Q12H Constant, Peggy, MD   100 mg at 10/17/20 0846  . ibuprofen (ADVIL) tablet 600 mg  600 mg Oral Q6H Constant, Peggy, MD   600 mg at 10/17/20 0846  . oxyCODONE-acetaminophen (PERCOCET/ROXICET) 5-325 MG per tablet 1-2 tablet  1-2 tablet Oral Q3H PRN Constant, Peggy, MD   1 tablet at 10/17/20 0850  . potassium chloride SA (KLOR-CON) CR tablet 20 mEq  20 mEq Oral BID Aletha Halim, MD      . prenatal multivitamin tablet 1 tablet  1 tablet Oral Q1200 Constant, Peggy, MD   1 tablet at 10/16/20 1214      Labs  Recent Labs  Lab 10/13/20 0943 10/14/20 1501 10/17/20 0532  WBC 13.2* 8.7 9.3  HGB 9.0* 7.8* 7.2*  HCT 31.6* 27.5* 26.6*  PLT 864* 814* 758*    Recent Labs  Lab 10/13/20 0943 10/14/20 1501  NA 136 134*  K 3.3* 3.3*  CL 100 98  CO2 25 26  BUN 16 10  CREATININE 0.86 0.72  CALCIUM 9.1 8.6*  PROT 8.8*  --   BILITOT 0.2*  --   ALKPHOS 65  --   ALT 11  --   AST 16  --   GLUCOSE 102* 92    Radiology No new imaging.   Narrative &  Impression  CLINICAL DATA:  Severe pelvic pain for 1 week. Last menstrual period 09/23/2020.  EXAM: TRANSABDOMINAL ULTRASOUND OF PELVIS  DOPPLER ULTRASOUND OF OVARIES  TECHNIQUE: Transabdominal ultrasound examination of the pelvis was performed including evaluation of the uterus, ovaries, adnexal regions, and pelvic cul-de-sac.  Color and duplex Doppler ultrasound was utilized to evaluate blood flow to the ovaries.  COMPARISON:  None.  FINDINGS: Measurements: 6.3 x 2.4 x 3.4 cm = volume: 27 mL. No fibroids or other mass visualized.  Endometrium  Thickness: 3 mm.  No focal abnormality visualized.  Right ovary  Measurements: 5.9 x 4.1 x 4.3 cm = volume: 54 mL. A right adnexal mass has multiple thin septations and measures 10.0 x 6.1 x 7.2 cm.  Left ovary  Measurements: 5.0 x 4.7 x 5.3 cm = volume: 65 mL. The left ovary has a heterogeneous  appearance.  Pulsed Doppler evaluation demonstrates normal low-resistance arterial and venous waveforms in both ovaries.  Other findings: A heterogeneous soft tissue mass superior to the uterus measures 8.0 x 4.8 x 8.6 cm and demonstrates internal blood flow. Small volume free fluid is seen in the pelvis.  IMPRESSION: 1. Enlarged and heterogeneous appearing left ovary may reflect infection or torsion.  2. Large cystic mass in the vicinity of the right adnexa may reflect hydrosalpinx, pyosalpinx, or an adnexal cyst with multiple thin septations.  3. Heterogeneous soft tissue mass superior to the uterus is nonspecific and may reflect a tubo-ovarian abscess.  These results were called by telephone at the time of interpretation on 10/14/2020 at 2:12 pm to the patient's provider, who verbally acknowledged these results.   Electronically Signed   By: Zerita Boers M.D.   On: 10/14/2020 14:12       Assessment & Plan:  Pt stable *GYN: continue cefoxitin and doxy. See below. I told her that I'm not sure if she has a TOA or potentially a hemorrhagic cyst. If H/H is stable, then may just need IV iron and may reimage now vs as an outpatient. If H/H is down more, then she may need repeat imaging (CT vs MRI) vs going to the OR to see exactly what these two masses are.  -negative GC/CT, wet prep, UPT *Anemia: H/H keeps dropping. No s/s of anemia. Rpt ordered for 1300 today.  *Pain: controlled with PO PRNs *FEN/GI: SLIV, regular diet *PPx: SCDs *Dispo: potentially tomorrow  Code Status: Full Code  Total time taking care of the patient was 25 minutes, with greater than 50% of the time spent in face to face interaction with the patient.  Durene Romans MD Attending Center for [place] ([facility] G[facility] Consult Phone: [phone] (M-F, 0800-1700) & [phone] (Off hours, weekends, holidays)

## 2020-10-18 ENCOUNTER — Inpatient Hospital Stay (HOSPITAL_COMMUNITY): Payer: Medicaid Other

## 2020-10-18 DIAGNOSIS — D75839 Thrombocytosis, unspecified: Secondary | ICD-10-CM

## 2020-10-18 DIAGNOSIS — D649 Anemia, unspecified: Secondary | ICD-10-CM

## 2020-10-18 LAB — CBC WITH DIFFERENTIAL/PLATELET
Abs Immature Granulocytes: 0.05 10*3/uL (ref 0.00–0.07)
Basophils Absolute: 0 10*3/uL (ref 0.0–0.1)
Basophils Relative: 0 %
Eosinophils Absolute: 0.5 10*3/uL (ref 0.0–0.5)
Eosinophils Relative: 6 %
HCT: 26.3 % — ABNORMAL LOW (ref 36.0–46.0)
Hemoglobin: 7.3 g/dL — ABNORMAL LOW (ref 12.0–15.0)
Immature Granulocytes: 1 %
Lymphocytes Relative: 15 %
Lymphs Abs: 1.2 10*3/uL (ref 0.7–4.0)
MCH: 16.7 pg — ABNORMAL LOW (ref 26.0–34.0)
MCHC: 27.8 g/dL — ABNORMAL LOW (ref 30.0–36.0)
MCV: 60.3 fL — ABNORMAL LOW (ref 80.0–100.0)
Monocytes Absolute: 0.5 10*3/uL (ref 0.1–1.0)
Monocytes Relative: 7 %
Neutro Abs: 5.8 10*3/uL (ref 1.7–7.7)
Neutrophils Relative %: 71 %
Platelets: 797 10*3/uL — ABNORMAL HIGH (ref 150–400)
RBC: 4.36 MIL/uL (ref 3.87–5.11)
RDW: 19.5 % — ABNORMAL HIGH (ref 11.5–15.5)
WBC: 8.2 10*3/uL (ref 4.0–10.5)
nRBC: 0 % (ref 0.0–0.2)

## 2020-10-18 LAB — PROTIME-INR
INR: 1.1 (ref 0.8–1.2)
Prothrombin Time: 13.7 seconds (ref 11.4–15.2)

## 2020-10-18 LAB — TECHNOLOGIST SMEAR REVIEW

## 2020-10-18 LAB — ABO/RH: ABO/RH(D): O POS

## 2020-10-18 MED ORDER — IOHEXOL 300 MG/ML  SOLN
100.0000 mL | Freq: Once | INTRAMUSCULAR | Status: AC | PRN
  Administered 2020-10-18: 100 mL via INTRAVENOUS

## 2020-10-18 MED ORDER — POLYETHYLENE GLYCOL 3350 17 G PO PACK
17.0000 g | PACK | Freq: Every day | ORAL | Status: DC
  Administered 2020-10-19 – 2020-10-20 (×2): 17 g via ORAL
  Filled 2020-10-18 (×2): qty 1

## 2020-10-18 MED ORDER — LACTATED RINGERS IV SOLN: INTRAVENOUS | Status: DC

## 2020-10-18 MED ORDER — POLYSACCHARIDE IRON COMPLEX 150 MG PO CAPS
150.0000 mg | ORAL_CAPSULE | Freq: Every day | ORAL | Status: DC
  Administered 2020-10-19 – 2020-10-20 (×2): 150 mg via ORAL
  Filled 2020-10-18 (×2): qty 1

## 2020-10-18 NOTE — Progress Notes (Addendum)
Gynecology Progress Note  Admission Date: 10/14/2020 Current Date: 10/18/2020 8:24 AM  Rebecca Bell is a 19 y.o. G0 HD#5 admitted for abdominal pain, presumed TOA on imaging   History complicated by: Patient Active Problem List   Diagnosis Date Noted  . Thrombocytosis 10/17/2020  . Anemia 10/17/2020  . Tubo-ovarian abscess 10/14/2020    ROS and patient/family/surgical history, located on admission H&P note dated 10/14/2020, have been reviewed, and there are no changes except as noted below Yesterday/Overnight Events:  Pt got her MRI last night  Subjective:  Patient states pain (sharp) started again worse last night around the time of the MRI; s/s are stable. No nausea. Hasn't had any PO today. No fevers, chills  Objective:    Current Vital Signs 24h Vital Sign Ranges  T 98.4 F (36.9 C) Temp  Avg: 98.4 F (36.9 C)  Min: 98 F (36.7 C)  Max: 98.9 F (37.2 C)  BP (!) 99/58 BP  Min: 99/58  Max: 109/71  HR 91 Pulse  Avg: 82.8  Min: 71  Max: 97  RR 17 Resp  Avg: 18  Min: 17  Max: 19  SaO2 100 % Room Air SpO2  Avg: 100 %  Min: 100 %  Max: 100 %       24 Hour I/O Current Shift I/O  Time Ins Outs 10/31 0701 - 11/01 0700 In: 1100 [P.O.:900] Out: -  No intake/output data recorded.    Physical exam: General appearance: appears mildly uncomfortable Abdomen: soft, moderately ttp in the suprapubic area.  Lungs: clear to auscultation bilaterally Heart: S1, S2 normal, no murmur, rub or gallop, regular rate and rhythm Extremities: no c/c/e Skin: warm and dry Psych: appropriate Neurologic: Grossly normal  Medications Current Facility-Administered Medications  Medication Dose Route Frequency Provider Last Rate Last Admin  . diphenhydrAMINE (BENADRYL) capsule 25 mg  25 mg Oral Q6H PRN Griffin Basil, MD   25 mg at 10/18/20 0816  . doxycycline (VIBRA-TABS) tablet 100 mg  100 mg Oral Q12H Constant, Peggy, MD   100 mg at 10/18/20 0811  . enoxaparin (LOVENOX) injection 40 mg  40  mg Subcutaneous Q24H Aletha Halim, MD   40 mg at 10/17/20 2015  . ibuprofen (ADVIL) tablet 600 mg  600 mg Oral Q6H PRN Aletha Halim, MD      . Derrill Memo ON 10/19/2020] iron polysaccharides (NIFEREX) capsule 150 mg  150 mg Oral Daily Aletha Halim, MD      . lactated ringers infusion   Intravenous Continuous Aletha Halim, MD 100 mL/hr at 10/18/20 0820 New Bag at 10/18/20 0820  . metroNIDAZOLE (FLAGYL) tablet 500 mg  500 mg Oral Q12H Aletha Halim, MD   500 mg at 10/18/20 0811  . oxyCODONE-acetaminophen (PERCOCET/ROXICET) 5-325 MG per tablet 1-2 tablet  1-2 tablet Oral Q3H PRN Constant, Peggy, MD   2 tablet at 10/18/20 0811  . [START ON 10/19/2020] polyethylene glycol (MIRALAX / GLYCOLAX) packet 17 g  17 g Oral Daily Dezarae Mcclaran, Eduard Clos, MD      . potassium chloride SA (KLOR-CON) CR tablet 20 mEq  20 mEq Oral BID Aletha Halim, MD   20 mEq at 10/18/20 0811      Labs  Recent Labs  Lab 10/14/20 1501 10/17/20 0532 10/17/20 1700  WBC 8.7 9.3 10.2  HGB 7.8* 7.2* 8.7*  HCT 27.5* 26.6* 31.4*  PLT 814* 758* 919*    Recent Labs  Lab 10/13/20 0943 10/14/20 1501  NA 136 134*  K 3.3* 3.3*  CL  100 98  CO2 25 26  BUN 16 10  CREATININE 0.86 0.72  CALCIUM 9.1 8.6*  PROT 8.8*  --   BILITOT 0.2*  --   ALKPHOS 65  --   ALT 11  --   AST 16  --   GLUCOSE 102* 92    Radiology Narrative & Impression  CLINICAL DATA:  Pelvic mass on ultrasound  EXAM: MRI PELVIS WITHOUT AND WITH CONTRAST  TECHNIQUE: Multiplanar multisequence MR imaging of the pelvis was performed both before and after administration of intravenous contrast.  CONTRAST:  37m GADAVIST GADOBUTROL 1 MMOL/ML IV SOLN  COMPARISON:  Pelvic ultrasound dated 10/06/2020  FINDINGS: Urinary Tract:  Bladder is within normal limits.  Bowel: Long segment wall thickening involving the distal/terminal ileum (series 5/images 10 and 13), nonspecific.  Vascular/Lymphatic: No evidence of aneurysm.  No suspicious  pelvic lymphadenopathy.  Reproductive:  Uterus is within normal limits.  Right ovary is within normal limits.  3.8 x 3.8 x 2.9 cm peripherally enhancing left ovarian mass (series 3/image 16). No significant intrinsic T1 hyperintensity on precontrast imaging. As such, this appearance favors a tubo-ovarian abscess over a hemorrhagic corpus luteum.  Other: Moderate pelvic ascites, partially loculated with mass effect in the dependent pelvis.  Musculoskeletal: No focal osseous lesions.  IMPRESSION: 3.8 cm left ovarian mass, favoring a small tubo-ovarian abscess.  Long segment wall thickening involving the distal/terminal ileum, nonspecific and likely reactive given the left adnexal inflammatory process favored above.  Associated moderate pelvic ascites, partially loculated with mass-effect in the dependent pelvis.   Electronically Signed   By: SJulian HyM.D.   On: 10/18/2020 07:27   Narrative & Impression  CLINICAL DATA:  Severe pelvic pain for 1 week. Last menstrual period 09/23/2020.  EXAM: TRANSABDOMINAL ULTRASOUND OF PELVIS  DOPPLER ULTRASOUND OF OVARIES  TECHNIQUE: Transabdominal ultrasound examination of the pelvis was performed including evaluation of the uterus, ovaries, adnexal regions, and pelvic cul-de-sac.  Color and duplex Doppler ultrasound was utilized to evaluate blood flow to the ovaries.  COMPARISON:  None.  FINDINGS: Measurements: 6.3 x 2.4 x 3.4 cm = volume: 27 mL. No fibroids or other mass visualized.  Endometrium  Thickness: 3 mm.  No focal abnormality visualized.  Right ovary  Measurements: 5.9 x 4.1 x 4.3 cm = volume: 54 mL. A right adnexal mass has multiple thin septations and measures 10.0 x 6.1 x 7.2 cm.  Left ovary  Measurements: 5.0 x 4.7 x 5.3 cm = volume: 65 mL. The left ovary has a heterogeneous appearance.  Pulsed Doppler evaluation demonstrates normal low-resistance arterial and venous  waveforms in both ovaries.  Other findings: A heterogeneous soft tissue mass superior to the uterus measures 8.0 x 4.8 x 8.6 cm and demonstrates internal blood flow. Small volume free fluid is seen in the pelvis.  IMPRESSION: 1. Enlarged and heterogeneous appearing left ovary may reflect infection or torsion.  2. Large cystic mass in the vicinity of the right adnexa may reflect hydrosalpinx, pyosalpinx, or an adnexal cyst with multiple thin septations.  3. Heterogeneous soft tissue mass superior to the uterus is nonspecific and may reflect a tubo-ovarian abscess.  These results were called by telephone at the time of interpretation on 10/14/2020 at 2:12 pm to the patient's provider, who verbally acknowledged these results.   Electronically Signed   By: TZerita BoersM.D.   On: 10/14/2020 14:12    Assessment & Plan:  Pt stable *GYN: pt more uncomfortable today. Rpt cbc with diff ordered. ?  if adnexal mass ruptured in the interim from the u/s given moderate ascites now. May want to consider IR consult for drainage, especially if WBC increases; also may want to switch back to IV abx if WBC increases. T&S ordered. Pt is on ppx lovenox with last dose at 2015 last night and next at [place].  -s/p 4 days of cefoxitin  -negative GC/CT, wet prep, UPT *Anemia: no s/s of anemia *Plts: f/u cbc and talk to heme if still very elevated.  *Pain: controlled with PO PRNs *FEN/GI: started MIVF, pt made NPO.  *PPx: SCDs *Dispo: with improved s/s.   Code Status: Full Code  Total time taking care of the patient was 25 minutes, with greater than 50% of the time spent in face to face interaction with the patient.  Durene Romans MD [facility]ending [facility] [place] [facility] GYN Consult Phone: [phone] (M-F, 0800-1700) & [phone] (Off hours, weekends, holidays)

## 2020-10-18 NOTE — Consult Note (Signed)
Chief Complaint: Patient was seen in consultation today for intra-pelvic fluid collections/aspiration and drainage.  Referring Physician(s): Constant, Peggy (OB/GYN)  Supervising Physician: Sandi Mariscal  Patient Status: [facility] - In-pt  History of Present Illness: Rebecca Bell is a 19 y.o. female with an unremarkable past medical history who has been admitted to [facility] since 10/13/2020 for management of abdominal pain secondary to presumed TOA. She was started on IV antibiotics. Pain continued, so additional imaging was obtained which revealed progression of TOA, along with additional intra-pelvic fluid collection.  US pelvic doppler 10/14/2020: 1. Enlarged and heterogeneous appearing left ovary may reflect infection or torsion. 2. Large cystic mass in the vicinity of the right adnexa may reflect hydrosalpinx, pyosalpinx, or an adnexal cyst with multiple thin septations. 3. Heterogeneous soft tissue mass superior to the uterus is nonspecific and may reflect a tubo-ovarian abscess.  MR pelvis 10/17/2020: 1. 3.8 cm left ovarian mass, favoring a small tubo-ovarian abscess. 2. Long segment wall thickening involving the distal/terminal ileum, nonspecific and likely reactive given the left adnexal inflammatory process favored above. 3. Associated moderate pelvic ascites, partially loculated with mass-effect in the dependent pelvis.  CT pelvis 10/18/2020: 1. 3.7 cm irregular enhancing lesion in the left ovary, favoring a tubo-ovarian abscess on recent MRI. 2. Long segment wall thickening involving the distal/terminal ileum, abnormal. This appearance may be reactive given the left adnexal process, but infectious/inflammatory ileitis is not excluded. At a minimum, GI follow-up after resolution of the pelvic inflammatory process is suggested. 3. Small to moderate pelvic ascites, mildly masslike. Associated fluid/ascites along the small bowel mesentery.  IR consulted by Dr. Elly Modena for possible  image-guided intra-pelvic aspiration with possible drain placement. Patient awake and alert sitting in bed eating a jolly rancher. Complains of abdominal pain, rated 6/10 at this time. Denies N/V associated with abdominal pain. Denies fever, chills, chest pain, dyspnea, or headache.   Past Medical History:  Diagnosis Date  . Abdominal pain     Past Surgical History:  Procedure Laterality Date  . TIBIA FRACTURE SURGERY      Allergies: Patient has no known allergies.  Medications: Prior to Admission medications   Medication Sig Start Date End Date Taking? Authorizing Provider  celecoxib (CELEBREX) 200 MG capsule Take 1 capsule (200 mg total) by mouth 2 (two) times daily. Patient not taking: Reported on 10/14/2020 10/13/20   Margarita Mail, PA-C  doxycycline (VIBRAMYCIN) 100 MG capsule Take 1 capsule (100 mg total) by mouth 2 (two) times daily. For the next 6 days. Your first dose was given in the ER. Please take your second dose tonight at bedtime with food. Patient not taking: Reported on 10/14/2020 10/13/20   Margarita Mail, PA-C  ondansetron (ZOFRAN) 4 MG tablet Take 1 tablet (4 mg total) by mouth every 8 (eight) hours as needed for nausea or vomiting. Patient not taking: Reported on 10/14/2020 10/13/20   Margarita Mail, PA-C     No family history on file.  Social History   Socioeconomic History  . Marital status: Single    Spouse name: Not on file  . Number of children: Not on file  . Years of education: Not on file  . Highest education level: Not on file  Occupational History  . Not on file  Tobacco Use  . Smoking status: Never Smoker  . Smokeless tobacco: Never Used  Substance and Sexual Activity  . Alcohol use: Yes    Comment: occ  . Drug use: Not on file    Comment:  last time 1 month ago  . Sexual activity: Not on file  Other Topics Concern  . Not on file  Social History Narrative  . Not on file   Social Determinants of Health   Financial Resource  Strain:   . Difficulty of Paying Living Expenses: Not on file  Food Insecurity:   . Worried About Charity fundraiser in the Last Year: Not on file  . Ran Out of Food in the Last Year: Not on file  Transportation Needs:   . Lack of Transportation (Medical): Not on file  . Lack of Transportation (Non-Medical): Not on file  Physical Activity:   . Days of Exercise per Week: Not on file  . Minutes of Exercise per Session: Not on file  Stress:   . Feeling of Stress : Not on file  Social Connections:   . Frequency of Communication with Friends and Family: Not on file  . Frequency of Social Gatherings with Friends and Family: Not on file  . Attends Religious Services: Not on file  . Active Member of Clubs or Organizations: Not on file  . Attends Archivist Meetings: Not on file  . Marital Status: Not on file     Review of Systems: A 12 point ROS discussed and pertinent positives are indicated in the HPI above.  All other systems are negative.  Review of Systems  Constitutional: Negative for chills and fever.  Respiratory: Negative for shortness of breath and wheezing.   Cardiovascular: Negative for chest pain and palpitations.  Gastrointestinal: Positive for abdominal pain. Negative for nausea and vomiting.  Neurological: Negative for headaches.  Psychiatric/Behavioral: Negative for behavioral problems and confusion.    Vital Signs: BP (!) 99/58 (BP Location: Right Arm)   Pulse 91   Temp 98.4 F (36.9 C) (Oral)   Resp 17   Ht 5' 5"  (1.651 m)   Wt 111 lb 15.9 oz (50.8 kg)   LMP 09/23/2020   SpO2 100%   BMI 18.64 kg/m   Physical Exam Vitals and nursing note reviewed.  Constitutional:      General: She is not in acute distress.    Appearance: Normal appearance.  Cardiovascular:     Rate and Rhythm: Normal rate and regular rhythm.     Heart sounds: Normal heart sounds. No murmur heard.   Pulmonary:     Effort: Pulmonary effort is normal. No respiratory  distress.     Breath sounds: Normal breath sounds. No wheezing.  Skin:    General: Skin is warm and dry.  Neurological:     Mental Status: She is alert and oriented to person, place, and time.      MD Evaluation Airway: WNL Heart: WNL Abdomen: WNL Chest/ Lungs: WNL ASA  Classification: 2 Mallampati/Airway Score: One   Imaging: CT PELVIS W CONTRAST  Result Date: 10/18/2020 CLINICAL DATA:  Tubo-ovarian abscess EXAM: CT PELVIS WITH CONTRAST TECHNIQUE: Multidetector CT imaging of the pelvis was performed using the standard protocol following the bolus administration of intravenous contrast. CONTRAST:  158m OMNIPAQUE IOHEXOL 300 MG/ML  SOLN COMPARISON:  MR pelvis dated 10/17/2020. Ultrasound pelvis dated 10/14/2020. FINDINGS: Urinary Tract:  Bladder is within normal limits. Bowel: Long segment wall thickening involving the distal/terminal ileum (series 3/image 15), underdistended but abnormal. Vascular/Lymphatic: No evidence of aneurysm. No suspicious pelvic lymphadenopathy. Reproductive:  Uterus is within normal limits. Right ovary is likely within normal limits (series 3/image 20). 3.1 x 3.7 cm irregular enhancing lesion in the left ovary (  series 3/image 20; sagittal image 92). Other: Small to moderate pelvic ascites, mildly masslike. Associated fluid/ascites along the small bowel mesentery (series 3/images 11 and 14). Musculoskeletal: Visualized osseous structures are within normal limits. IMPRESSION: 3.7 cm irregular enhancing lesion in the left ovary, favoring a tubo-ovarian abscess on recent MRI. Long segment wall thickening involving the distal/terminal ileum, abnormal. This appearance may be reactive given the left adnexal process, but infectious/inflammatory ileitis is not excluded. At a minimum, GI follow-up after resolution of the pelvic inflammatory process is suggested. Small to moderate pelvic ascites, mildly masslike. Associated fluid/ascites along the small bowel mesentery.  Electronically Signed   By: Julian Hy M.D.   On: 10/18/2020 13:35   MR PELVIS W WO CONTRAST  Result Date: 10/18/2020 CLINICAL DATA:  Pelvic mass on ultrasound EXAM: MRI PELVIS WITHOUT AND WITH CONTRAST TECHNIQUE: Multiplanar multisequence MR imaging of the pelvis was performed both before and after administration of intravenous contrast. CONTRAST:  47m GADAVIST GADOBUTROL 1 MMOL/ML IV SOLN COMPARISON:  Pelvic ultrasound dated 10/06/2020 FINDINGS: Urinary Tract:  Bladder is within normal limits. Bowel: Long segment wall thickening involving the distal/terminal ileum (series 5/images 10 and 13), nonspecific. Vascular/Lymphatic: No evidence of aneurysm. No suspicious pelvic lymphadenopathy. Reproductive:  Uterus is within normal limits. Right ovary is within normal limits. 3.8 x 3.8 x 2.9 cm peripherally enhancing left ovarian mass (series 3/image 16). No significant intrinsic T1 hyperintensity on precontrast imaging. As such, this appearance favors a tubo-ovarian abscess over a hemorrhagic corpus luteum. Other: Moderate pelvic ascites, partially loculated with mass effect in the dependent pelvis. Musculoskeletal: No focal osseous lesions. IMPRESSION: 3.8 cm left ovarian mass, favoring a small tubo-ovarian abscess. Long segment wall thickening involving the distal/terminal ileum, nonspecific and likely reactive given the left adnexal inflammatory process favored above. Associated moderate pelvic ascites, partially loculated with mass-effect in the dependent pelvis. Electronically Signed   By: SJulian HyM.D.   On: 10/18/2020 07:27   [place]PELVIC DOPPLER (TORSION R/O OR MASS ARTERIAL FLOW)  Result Date: 10/14/2020 CLINICAL DATA:  Severe pelvic pain for 1 week. Last menstrual period 09/23/2020. EXAM: TRANSABDOMINAL ULTRASOUND OF PELVIS DOPPLER ULTRASOUND OF OVARIES TECHNIQUE: Transabdominal ultrasound examination of the pelvis was performed including evaluation of the uterus, ovaries, adnexal  regions, and pelvic cul-de-sac. Color and duplex Doppler ultrasound was utilized to evaluate blood flow to the ovaries. COMPARISON:  None. FINDINGS: Measurements: 6.3 x 2.4 x 3.4 cm = volume: 27 mL. No fibroids or other mass visualized. Endometrium Thickness: 3 mm.  No focal abnormality visualized. Right ovary Measurements: 5.9 x 4.1 x 4.3 cm = volume: 54 mL. A right adnexal mass has multiple thin septations and measures 10.0 x 6.1 x 7.2 cm. Left ovary Measurements: 5.0 x 4.7 x 5.3 cm = volume: 65 mL. The left ovary has a heterogeneous appearance. Pulsed Doppler evaluation demonstrates normal low-resistance arterial and venous waveforms in both ovaries. Other findings: A heterogeneous soft tissue mass superior to the uterus measures 8.0 x 4.8 x 8.6 cm and demonstrates internal blood flow. Small volume free fluid is seen in the pelvis. IMPRESSION: 1. Enlarged and heterogeneous appearing left ovary may reflect infection or torsion. 2. Large cystic mass in the vicinity of the right adnexa may reflect hydrosalpinx, pyosalpinx, or an adnexal cyst with multiple thin septations. 3. Heterogeneous soft tissue mass superior to the uterus is nonspecific and may reflect a tubo-ovarian abscess. These results were called by telephone at the time of interpretation on 10/14/2020 at 2:12 pm to the  patient's provider, who verbally acknowledged these results. Electronically Signed   By: Zerita Boers M.D.   On: 10/14/2020 14:12    Labs:  CBC: Recent Labs    10/14/20 1501 10/17/20 0532 10/17/20 1700 10/18/20 0817  WBC 8.7 9.3 10.2 8.2  HGB 7.8* 7.2* 8.7* 7.3*  HCT 27.5* 26.6* 31.4* 26.3*  PLT 814* 758* 919* 797*    COAGS: No results for input(s): INR, APTT in the last 8760 hours.  BMP: Recent Labs    10/13/20 0943 10/14/20 1501  NA 136 134*  K 3.3* 3.3*  CL 100 98  CO2 25 26  GLUCOSE 102* 92  BUN 16 10  CALCIUM 9.1 8.6*  CREATININE 0.86 0.72  GFRNONAA >60 >60    LIVER FUNCTION TESTS: Recent Labs     10/13/20 0943  BILITOT 0.2*  AST 16  ALT 11  ALKPHOS 65  PROT 8.8*  ALBUMIN 3.4*     Assessment and Plan:  Abdominal pain secondary to presumed TOA in setting of intra-pelvic fluid collection. Plan for image-guided intra-pelvic fluid collection aspiration(s) with possible drain placement(s) tentatively for tomorrow in IR pending IR scheduling. Patient will be NPO at midnight. Afebrile. She does not take blood thinners. INR pending.  Risks and benefits discussed with the patient including bleeding, infection, damage to adjacent structures, bowel perforation/fistula connection, and sepsis. All of the patient's questions were answered, patient is agreeable to proceed. Consent signed and in chart.   Thank you for this interesting consult.  I greatly enjoyed meeting [facility] and look forward to participating in their care.  A copy of this report was sent to the requesting provider on this date.  Electronically Signed: Earley Abide, PA-C 10/18/2020, 3:11 PM   I spent a total of 40 Minutes in face to face in clinical consultation, greater than 50% of which was counseling/coordinating care for intra-pelvic fluid collections/aspiration and drainage.

## 2020-10-19 ENCOUNTER — Inpatient Hospital Stay (HOSPITAL_COMMUNITY): Payer: Medicaid Other

## 2020-10-19 LAB — GRAM STAIN

## 2020-10-19 LAB — PATHOLOGIST SMEAR REVIEW: Path Review: 11012021

## 2020-10-19 LAB — PREPARE RBC (CROSSMATCH)

## 2020-10-19 MED ORDER — SODIUM CHLORIDE 0.9% IV SOLUTION: Freq: Once | INTRAVENOUS | Status: DC

## 2020-10-19 MED ORDER — DIPHENHYDRAMINE HCL 25 MG PO CAPS: 25.0000 mg | ORAL_CAPSULE | Freq: Once | ORAL | Status: DC

## 2020-10-19 MED ORDER — HYDROMORPHONE HCL 1 MG/ML IJ SOLN
1.0000 mg | INTRAMUSCULAR | Status: DC | PRN
  Administered 2020-10-19 (×2): 1 mg via INTRAVENOUS
  Filled 2020-10-19 (×2): qty 1

## 2020-10-19 MED ORDER — FENTANYL CITRATE (PF) 100 MCG/2ML IJ SOLN
INTRAMUSCULAR | Status: AC
  Filled 2020-10-19: qty 4

## 2020-10-19 MED ORDER — MIDAZOLAM HCL 2 MG/2ML IJ SOLN
INTRAMUSCULAR | Status: AC
  Filled 2020-10-19: qty 4

## 2020-10-19 MED ORDER — ACETAMINOPHEN 325 MG PO TABS: 650.0000 mg | ORAL_TABLET | Freq: Once | ORAL | Status: DC

## 2020-10-19 MED ORDER — MIDAZOLAM HCL 2 MG/2ML IJ SOLN
INTRAMUSCULAR | Status: AC | PRN
  Administered 2020-10-19 (×2): 1 mg via INTRAVENOUS

## 2020-10-19 MED ORDER — FENTANYL CITRATE (PF) 100 MCG/2ML IJ SOLN
INTRAMUSCULAR | Status: AC | PRN
Start: 2020-10-19 — End: 2020-10-19
  Administered 2020-10-19: 50 ug via INTRAVENOUS
  Administered 2020-10-19: 25 ug via INTRAVENOUS

## 2020-10-19 NOTE — Procedures (Signed)
Interventional Radiology Procedure Note  Procedure: CT guided aspiration of pelvic fluid  Complications: None  Estimated Blood Loss: < 10 mL  Findings: 5 Fr Yueh catheter advanced from left transgluteal approach posteriorly into pelvic fluid with return of clear, yellowish fluid. Sample sent for culture. Able to aspirate 280 mL of fluid with no significant fluid remaining by CT. There is no safe posterior or anterior percutaneous window to level of left ovarian/tubal fluid collection.  Rebecca Bell. Kathlene Cote, M.D Pager:  [phone]

## 2020-10-19 NOTE — Progress Notes (Signed)
Patient ID: Rebecca Bell, female   DOB: 08-10-2001, 19 y.o.   MRN: [id number] Subjective: Patient reports persistent lower abdominal pain this morning, unchanged from yesterday, rating it 7/10. Pain is localized in suprapubic region  Objective: I have reviewed patient's vital signs, medications, labs and radiology results. Blood pressure 110/70, pulse 80, temperature 98 F (36.7 C), temperature source Oral, resp. rate 17, height 5' 5"  (1.651 m), weight 50.8 kg, last menstrual period 09/23/2020, SpO2 100 %.  General: alert, cooperative and no distress Resp: clear to auscultation bilaterally and normal effort GI: soft, suprapubic tenderness with light palpation Extremities: extremities normal, atraumatic, no cyanosis or edema   Assessment/Plan: Patient scheduled for IR drainage of TOA and pelvic fluid Pain management with dilaudid until no longer NPO Continue current antibiotic regimen for TOA   LOS: 5 days    Melchor Kirchgessner 10/19/2020, 9:30 AM

## 2020-10-19 NOTE — Progress Notes (Signed)
Pt off unit to IR.

## 2020-10-20 ENCOUNTER — Other Ambulatory Visit (HOSPITAL_COMMUNITY): Payer: Self-pay | Admitting: Obstetrics and Gynecology

## 2020-10-20 LAB — BPAM RBC
Blood Product Expiration Date: 202111192359
Blood Product Expiration Date: 202112012359
ISSUE DATE / TIME: 202111021533
ISSUE DATE / TIME: 202111022017
Unit Type and Rh: 5100
Unit Type and Rh: 5100

## 2020-10-20 LAB — TYPE AND SCREEN
ABO/RH(D): O POS
Antibody Screen: NEGATIVE
Unit division: 0
Unit division: 0

## 2020-10-20 LAB — CBC
HCT: 31.3 % — ABNORMAL LOW (ref 36.0–46.0)
Hemoglobin: 9.4 g/dL — ABNORMAL LOW (ref 12.0–15.0)
MCH: 19.7 pg — ABNORMAL LOW (ref 26.0–34.0)
MCHC: 30 g/dL (ref 30.0–36.0)
MCV: 65.8 fL — ABNORMAL LOW (ref 80.0–100.0)
Platelets: 678 10*3/uL — ABNORMAL HIGH (ref 150–400)
RBC: 4.76 MIL/uL (ref 3.87–5.11)
RDW: 24.2 % — ABNORMAL HIGH (ref 11.5–15.5)
WBC: 13 10*3/uL — ABNORMAL HIGH (ref 4.0–10.5)
nRBC: 0 % (ref 0.0–0.2)

## 2020-10-20 MED ORDER — DOXYCYCLINE HYCLATE 100 MG PO TABS
100.0000 mg | ORAL_TABLET | Freq: Two times a day (BID) | ORAL | 0 refills | Status: DC
Start: 2020-10-20 — End: 2020-11-03

## 2020-10-20 MED ORDER — OXYCODONE-ACETAMINOPHEN 5-325 MG PO TABS: 1.0000 | ORAL_TABLET | ORAL | 0 refills | Status: DC | PRN

## 2020-10-20 MED ORDER — FERROUS SULFATE 325 (65 FE) MG PO TABS: 325.0000 mg | ORAL_TABLET | Freq: Two times a day (BID) | ORAL | 1 refills | Status: DC

## 2020-10-20 MED ORDER — METRONIDAZOLE 500 MG PO TABS
500.0000 mg | ORAL_TABLET | Freq: Two times a day (BID) | ORAL | 0 refills | Status: DC
Start: 2020-10-20 — End: 2020-11-03

## 2020-10-20 MED ORDER — IBUPROFEN 600 MG PO TABS: 600.0000 mg | ORAL_TABLET | Freq: Four times a day (QID) | ORAL | 3 refills | Status: DC | PRN

## 2020-10-20 MED FILL — DOXYCYCLINE HYCLATE 100 MG: 100 | 12 days supply | Qty: 24 | Fill #0

## 2020-10-20 MED FILL — OXYCODONE-APAP 5-325MG: 5-325 | 4 days supply | Qty: 20 | Fill #0

## 2020-10-20 MED FILL — metroNIDAZOLE 500 MG TABS: 500 | 12 days supply | Qty: 24 | Fill #0

## 2020-10-20 MED FILL — IBUPROFEN 600 MG TABLET: 600 | 8 days supply | Qty: 30 | Fill #0

## 2020-10-20 MED FILL — FERROUS SULFATE 325 MG TAB: 325 (65 FE) | 30 days supply | Qty: 60 | Fill #0

## 2020-10-20 NOTE — Progress Notes (Signed)
Patient discharged to home with instructions and prescribed medications coming from [facility]. Instructions was given to patient's sister.

## 2020-10-20 NOTE — Discharge Summary (Signed)
Physician Discharge Summary  Patient ID: Rebecca Bell MRN: [id number] DOB/AGE: 01-20-2001 19 y.o.  Admit date: 10/14/2020 Discharge date: 10/20/2020  Admission Diagnoses: TOA  Discharge Diagnoses:  Active Problems:   Tubo-ovarian abscess   Thrombocytosis   Anemia   Discharged Condition: good  Hospital Course: Patient admitted for the treatment of a TOA. She received IV antibiotics and was later transitioned to oral antibiotics. She underwent drainage of pelvic abscess under CT guidance. She received a blood transfusion to correct anemia. Patient's pain improved following percutaneous drainage. Patient remained afebrile throughout her admission. She was found stable for discharge with plans to follow up in 2 weeks. Discharge instructions were reviewed with the patient  Consults: Interventional radiology   Discharge Exam: Blood pressure 115/69, pulse 93, temperature 98 F (36.7 C), resp. rate 17, height 5' 5"  (1.651 m), weight 50.8 kg, last menstrual period 09/23/2020, SpO2 100 %. GENERAL: Well-developed, well-nourished female in no acute distress.  LUNGS: Clear to auscultation bilaterally.  HEART: Regular rate and rhythm. ABDOMEN: Soft, Tenderness to deep palpation over suprapubic region, nondistended. No rebound, no guarding. EXTREMITIES: No cyanosis, clubbing, or edema, 2+ distal pulses.   Disposition: Home   Allergies as of 10/20/2020   No Known Allergies     Medication List    STOP taking these medications   celecoxib 200 MG capsule Commonly known as: CeleBREX   doxycycline 100 MG capsule Commonly known as: VIBRAMYCIN Replaced by: doxycycline 100 MG tablet   ondansetron 4 MG tablet Commonly known as: Zofran     TAKE these medications   doxycycline 100 MG tablet Commonly known as: VIBRA-TABS Take 1 tablet (100 mg total) by mouth every 12 (twelve) hours. Replaces: doxycycline 100 MG capsule   ferrous sulfate 325 (65 FE) MG tablet Commonly known as:  FerrouSul Take 1 tablet (325 mg total) by mouth 2 (two) times daily.   ibuprofen 600 MG tablet Commonly known as: ADVIL Take 1 tablet (600 mg total) by mouth every 6 (six) hours as needed for cramping.   metroNIDAZOLE 500 MG tablet Commonly known as: FLAGYL Take 1 tablet (500 mg total) by mouth every 12 (twelve) hours.   oxyCODONE-acetaminophen 5-325 MG tablet Commonly known as: PERCOCET/ROXICET Take 1 tablet by mouth every 4 (four) hours as needed (moderate to severe pain (when tolerating fluids)).       Follow-up Information    [place]. Schedule an appointment as soon as possible for a visit.   Contact information: [place] [address] [facility] [place] [facility] Follow up.   Specialty: Obstetrics and Gynecology Why: An appointment will be scheduled for you to be seen in the office in 2 weeks Contact information: [place] [id number] [phone]              Signed: Mora Bellman 10/20/2020, 9:14 AM

## 2020-10-20 NOTE — Discharge Instructions (Signed)
Pelvic Inflammatory Disease  Pelvic inflammatory disease (PID) is caused by an infection in some or all of the female reproductive organs. The infection can be in the uterus, ovaries, fallopian tubes, or the surrounding tissues in the pelvis. PID can cause abdominal or pelvic pain that comes on suddenly (acute pelvic pain). PID is a serious infection because it can lead to lasting (chronic) pelvic pain or the inability to have children (infertility). What are the causes? This condition is most often caused by bacteria that is spread during sexual contact. It can also be caused by a bacterial infection of the vagina (bacterial vaginosis) that is not spread by sexual contact. This condition occurs when the infection is not treated and the bacteria travel upward from the vagina or cervix into the reproductive organs. Bacteria may also be introduced into the reproductive organs following:  The birth of a baby.  A miscarriage.  An abortion.  Major pelvic surgery.  The insertion of an intrauterine device (IUD).  A sexual assault. What increases the risk? You are more likely to develop this condition if you:  Are younger than 21 years of age.  Are sexually active at a young age.  Have a history of STI (sexually transmitted infection) or PID.  Do not regularly use barrier contraception methods, such as condoms.  Have multiple sexual partners.  Have sex with someone who has symptoms of an STI.  Use a vaginal douche.  Have recently had an IUD inserted. What are the signs or symptoms? Symptoms of this condition include:  Abdominal or pelvic pain.  Fever.  Chills.  Abnormal vaginal discharge.  Abnormal uterine bleeding.  Unusual pain shortly after the end of a menstrual period.  Painful urination.  Pain with sex.  Nausea and vomiting. How is this diagnosed? This condition is diagnosed based on a pelvic exam and medical history. A pelvic exam can reveal signs of  infection, inflammation, and discharge in the vagina and the surrounding tissues. It can also help to identify painful areas. You may also have tests, such as:  Lab tests, including a pregnancy test, blood tests, and a urine test.  Culture tests of the vagina and cervix to check for an STI.  Ultrasound.  A laparoscopic procedure to look inside the pelvis.  Examination of vaginal discharge under a microscope. How is this treated? This condition may be treated with:  Antibiotic medicines taken by mouth (orally). For more severe cases, antibiotics may be given through an IV at the hospital.  Surgery. This is rare. Surgery may be needed if other treatments do not help.  Efforts to stop the spread of the infection. Sexual partners may need to be treated if the infection is caused by an STI. It may take weeks until you are completely well. If you are diagnosed with PID, you should also be checked for HIV (human immunodeficiency virus). Your health care provider may test you for infection again 3 months after treatment. You should not have unprotected sex. Follow these instructions at home:  Take over-the-counter and prescription medicines only as told by your health care provider.  If you were prescribed an antibiotic medicine, take it as told by your health care provider. Do not stop using the antibiotic even if you start to feel better.  Do not have sex until treatment is completed or as told by your health care provider. If PID is confirmed, your recent sexual partners will need treatment, especially if you had unprotected sex.  Keep all  follow-up visits as told by your health care provider. This is important. Contact a health care provider if:  You have increased or abnormal vaginal discharge.  Your pain does not improve.  You vomit.  You have a fever.  You cannot tolerate your medicines.  Your partner has an STI.  You have pain when you urinate. Get help right away  if:  You have increased abdominal or pelvic pain.  You have chills.  Your symptoms are not better in 72 hours with treatment. Summary  Pelvic inflammatory disease (PID) is caused by an infection in some or all of the female reproductive organs.  PID is a serious infection because it can lead to lasting (chronic) pelvic pain or the inability to have children (infertility).  This infection is usually treated with antibiotic medicines.  Do not have sex until treatment is completed or as told by your health care provider. This information is not intended to replace advice given to you by your health care provider. Make sure you discuss any questions you have with your health care provider. Document Revised: 08/22/2018 Document Reviewed: 08/27/2018 Elsevier Patient Education  2020 [place].  Percutaneous Abscess Drain Percutaneous abscess drain is removal of a collection of infected fluid inside the body (abscess). This is done by placing a thin needle under the skin and moving it into the abscess. A small tube (catheter) is inserted during the procedure and left in place for a few days to continue to drain the abscess. Tell a health care provider about:  Any allergies you have.  All medicines you are taking, including vitamins, herbs, eye drops, creams, and over-the-counter medicines.  Any problems you or family members have had with anesthetic medicines.  Any blood disorders you have.  Any surgeries you have had.  Any medical conditions you have.  Whether you are pregnant or may be pregnant.  Any history of tobacco use or smoking. What are the risks? Generally, this is a safe procedure. However, problems may occur, including:  Infection.  Bleeding.  Allergic reaction to medicines or materials used.  Damage to other structures or organs.  Blockage of the catheter, requiring placement of a new catheter.  A need to repeat the procedure.  Failure of the procedure  to drain the abscess completely, requiring an open surgical procedure to drain the abscess. An open procedure is done through a larger incision. What happens before the procedure?  Medicines  Ask your health care provider about: ? Changing or stopping your regular medicines. This is especially important if you are taking diabetes medicines or blood thinners. ? Taking medicines such as aspirin and ibuprofen. These medicines can thin your blood. Do not take these medicines before your procedure if your health care provider instructs you not to. Staying hydrated Follow instructions from your health care provider about hydration, which may include:  Up to 2 hours before the procedure - you may continue to drink clear liquids, such as water, clear fruit juice, black coffee, and plain tea. Eating and drinking restrictions Follow instructions from your health care provider about eating and drinking, which may include:  8 hours before the procedure - stop eating heavy meals or foods such as meat, fried foods, or fatty foods.  6 hours before the procedure - stop eating light meals or foods, such as toast or cereal.  6 hours before the procedure - stop drinking milk or drinks that contain milk.  2 hours before the procedure - stop drinking clear liquids.  General instructions  Plan to have someone take you home from the hospital or clinic.  If you will be going home right after the procedure, plan to have someone with you for 24 hours.  You may have blood tests or urine tests.  You may get a tetanus shot.  You may have imaging tests, such as an ultrasound, to check how large or deep your abscess is. What happens during the procedure?  To lower your risk of infection: ? Your health care team will wash or sanitize their hands. ? The skin around the abscess will be washed with soap. ? Hair may be removed from the surgical site.  An IV tube will be inserted into one of your veins.  You  will be given medicine to numb the area (local anesthetic) where the catheter will be placed. Placement of the catheter varies depending on where your abscess is located.  You may be given medicine to help you relax (sedative) or medicine to make you fall asleep (general anesthetic).  A small incision will be made in your skin.  A needle will be inserted under your skin and moved into the abscess. Images from ultrasound, X-ray, or a CT scan will be used to help guide the needle to the abscess.  A catheter will be inserted into your incision and moved underneath your skin until it reaches the abscess. Images will be used to help guide the catheter to the abscess.  After the catheter is in place, the needle will be removed. The catheter will be connected to a bag outside of your body. The catheter will stay in place until the fluid has stopped draining and the infection is gone. The procedure may vary among health care providers and hospitals. What happens after the procedure?  Your blood pressure, heart rate, breathing rate, and blood oxygen level will be monitored until the medicines you were given have worn off.  You may have some pain or nausea. Medicines will be available to help you. Summary  An abscess is a collection of infected fluid inside the body.  During this procedure, images from ultrasound, X-rays, or a CT scan are used to help guide the needle and catheter to the abscess.  A catheter will be left in place to continue to drain the abscess after the procedure. This information is not intended to replace advice given to you by your health care provider. Make sure you discuss any questions you have with your health care provider. Document Revised: 11/16/2017 Document Reviewed: 10/26/2016 Elsevier Patient Education  2020 [employer].

## 2020-10-24 LAB — CULTURE, BODY FLUID W GRAM STAIN -BOTTLE: Culture: NO GROWTH

## 2020-11-01 ENCOUNTER — Emergency Department (HOSPITAL_BASED_OUTPATIENT_CLINIC_OR_DEPARTMENT_OTHER)
Admission: EM | Admit: 2020-11-01 | Discharge: 2020-11-01 | Disposition: A | Discharge status: Home / Self Care | Payer: Medicaid Other | Attending: Emergency Medicine | Admitting: Emergency Medicine

## 2020-11-01 ENCOUNTER — Other Ambulatory Visit: Payer: Self-pay

## 2020-11-01 ENCOUNTER — Encounter (HOSPITAL_BASED_OUTPATIENT_CLINIC_OR_DEPARTMENT_OTHER): Payer: Self-pay | Admitting: Emergency Medicine

## 2020-11-01 DIAGNOSIS — R109 Unspecified abdominal pain: Secondary | ICD-10-CM | POA: Insufficient documentation

## 2020-11-01 DIAGNOSIS — Z5321 Procedure and treatment not carried out due to patient leaving prior to being seen by health care provider: Secondary | ICD-10-CM | POA: Insufficient documentation

## 2020-11-01 HISTORY — DX: Unspecified ovarian cyst, unspecified side: N83.209

## 2020-11-01 NOTE — ED Notes (Signed)
Pt told registration that she was going to see her dr tom and she left

## 2020-11-01 NOTE — ED Triage Notes (Addendum)
States was seen here before for cysts on her ovaries and she is till having pain has appointment tomorrow but states in to much pain

## 2020-11-03 ENCOUNTER — Other Ambulatory Visit: Payer: Self-pay

## 2020-11-03 ENCOUNTER — Other Ambulatory Visit: Payer: Self-pay | Admitting: Obstetrics and Gynecology

## 2020-11-03 ENCOUNTER — Ambulatory Visit (INDEPENDENT_AMBULATORY_CARE_PROVIDER_SITE_OTHER): Payer: Self-pay | Admitting: Obstetrics and Gynecology

## 2020-11-03 ENCOUNTER — Ambulatory Visit: Payer: Self-pay | Admitting: Obstetrics and Gynecology

## 2020-11-03 ENCOUNTER — Encounter: Payer: Self-pay | Admitting: Obstetrics and Gynecology

## 2020-11-03 VITALS — BP 122/73 | HR 122 | Ht 65.0 in | Wt 111.0 lb

## 2020-11-03 DIAGNOSIS — N7093 Salpingitis and oophoritis, unspecified: Secondary | ICD-10-CM

## 2020-11-03 MED ORDER — METRONIDAZOLE 500 MG PO TABS
500.0000 mg | ORAL_TABLET | Freq: Two times a day (BID) | ORAL | 0 refills | Status: DC
Start: 2020-11-03 — End: 2020-11-19

## 2020-11-03 MED ORDER — DOXYCYCLINE HYCLATE 100 MG PO TABS: 100.0000 mg | ORAL_TABLET | Freq: Two times a day (BID) | ORAL | 0 refills | Status: DC

## 2020-11-03 MED FILL — DOXYCYCLINE HYCLATE 100 MG: 100 | 12 days supply | Qty: 24 | Fill #0

## 2020-11-03 MED FILL — METRONIDAZOLE 500 MG TABS: 500 | 12 days supply | Qty: 24 | Fill #0

## 2020-11-03 NOTE — Progress Notes (Signed)
Feels like theres another cyst there or the one that couldn't be reached in the ED had busted. States she is in a lot of pain.

## 2020-11-03 NOTE — Progress Notes (Addendum)
  CC: Tuboovarian abscess, pelvic pain Subjective:    Patient ID: Rebecca Bell, female    DOB: 2001-10-23, 19 y.o.   MRN: [id number]  HPI  19 yo G0 seen as follow up visit for tubo-ovarian abscess 10/28-11/3.  Pt received IV antibiotics and had CT guided drainage.  Pt was discharged with po doxycycline and flagyl.  Pt notes completion of antibiotics.  She continues to have left sided pain at about the same level as she had in the hospital.  Pt denies nausea/vomiting, fever/chills.  She has had no constipation or diarrhea.       Review of Systems  Constitutional: Negative.   HENT: Negative.   Respiratory: Negative.   Cardiovascular: Negative.   Gastrointestinal: Negative for abdominal distention, constipation, diarrhea and nausea.  Genitourinary: Positive for pelvic pain. Negative for dysuria and vaginal bleeding.  Musculoskeletal: Negative.   Neurological: Negative.        Objective:   Physical Exam Constitutional:      General: She is not in acute distress.    Appearance: She is well-developed and normal weight.  HENT:     Head: Normocephalic and atraumatic.  Cardiovascular:     Rate and Rhythm: Normal rate and regular rhythm.     Heart sounds: Normal heart sounds.  Pulmonary:     Effort: Pulmonary effort is normal.     Breath sounds: Normal breath sounds.  Abdominal:     General: Abdomen is flat.     Tenderness: There is abdominal tenderness in the suprapubic area.     Comments: Mild right lower quadrant pain, no left lower quadrant pain   Skin:    General: Skin is warm.  Neurological:     Mental Status: She is alert.    Vitals:   11/03/20 1559  BP: 122/73  Pulse: (!) 122         Assessment & Plan:   1. Tubo-ovarian abscess Moderate decision making Pt continues to have pain after CT guided drainage and antibiotics. Continue antibiotics for now.  Pelvic u/s in 2-3 weeks. If there still appears to be an abscess, consider laparoscopy for further drainage or  removal of TOA. F/u in 3 weeks. - CBC - US PELVIC COMPLETE WITH TRANSVAGINAL; Future - doxycycline (VIBRA-TABS) 100 MG tablet; Take 1 tablet (100 mg total) by mouth every 12 (twelve) hours.  Dispense: 24 tablet; Refill: 0 - metroNIDAZOLE (FLAGYL) 500 MG tablet; Take 1 tablet (500 mg total) by mouth every 12 (twelve) hours.  Dispense: 24 tablet; Refill: 0    Griffin Basil, MD Faculty Attending, [facility]

## 2020-11-03 NOTE — Patient Instructions (Signed)
Pelvic Inflammatory Disease  Pelvic inflammatory disease (PID) is an infection in some or all of the female reproductive organs. PID can be in the womb (uterus), ovaries, fallopian tubes, or nearby tissues that are inside the lower belly area (pelvis). PID can lead to problems if it is not treated. What are the causes?  Germs (bacteria) that are spread during sex. This is the most common cause.  Germs in the vagina that are not spread during sex.  Germs that travel up from the vagina or cervix to the reproductive organs after: ? The birth of a baby. ? A miscarriage. ? An abortion. ? Pelvic surgery. ? Insertion of an intrauterine device (IUD). ? A sexual assault. What increases the risk?  Being younger than 21 years old.  Having sex at a young age.  Having a history of STI (sexually transmitted infection) or PID.  Not using barrier birth control, such as condoms.  Having a lot of sex partners.  Having sex with someone who has symptoms of an STI.  Using a douche.  Having an IUD put in place. What are the signs or symptoms?  Pain in the belly area.  Fever.  Chills.  Discharge from the vagina that is not normal.  Bleeding from the womb that is not normal.  Pain soon after the end of a menstrual period.  Pain when you pee (urinate).  Pain with sex.  Feeling sick to your stomach (nauseous) or throwing up (vomiting). How is this treated?  Antibiotic medicines. In very bad cases, these may be given through an IV tube.  Surgery. This is rare.  Efforts to stop the spread of the infection. Sex partners may need to be treated. It may take weeks until you feel all better. Your doctor may test you for infection again after you finish treatment. You should also be checked for HIV (human immunodeficiency virus). Follow these instructions at home:  Take over-the-counter and prescription medicines only as told by your doctor.  If you were prescribed an antibiotic  medicine, take it as told by your doctor. Do not stop taking it even if you start to feel better.  Do not have sex until treatment is done or as told by your doctor.  Tell your sex partner if you have PID. Your partner may need to be treated.  Keep all follow-up visits as told by your doctor. This is important. Contact a doctor if:  You have more fluid or fluid that is not normal coming from your vagina.  Your pain does not improve.  You throw up.  You have a fever.  You cannot take your medicines.  Your partner has an STI.  You have pain when you pee. Get help right away if:  You have more pain in the belly area.  You have chills.  You are not better in 72 hours with treatment. Summary  Pelvic inflammatory disease (PID) is caused by an infection in some or all of the female reproductive organs.  PID is a serious infection.  This infection is most often treated with antibiotics.  Do not have sex until treatment is done or as told by your doctor. This information is not intended to replace advice given to you by your health care provider. Make sure you discuss any questions you have with your health care provider. Document Revised: 08/22/2018 Document Reviewed: 08/28/2018 Elsevier Patient Education  [place].

## 2020-11-04 ENCOUNTER — Encounter: Payer: Self-pay | Admitting: Hematology and Oncology

## 2020-11-04 ENCOUNTER — Inpatient Hospital Stay: Payer: Medicaid Other

## 2020-11-04 ENCOUNTER — Telehealth: Payer: Self-pay | Admitting: Hematology and Oncology

## 2020-11-04 ENCOUNTER — Inpatient Hospital Stay: Payer: Medicaid Other | Attending: Hematology and Oncology | Admitting: Hematology and Oncology

## 2020-11-04 ENCOUNTER — Other Ambulatory Visit: Payer: Self-pay

## 2020-11-04 DIAGNOSIS — D75839 Thrombocytosis, unspecified: Secondary | ICD-10-CM | POA: Diagnosis not present

## 2020-11-04 DIAGNOSIS — Z79899 Other long term (current) drug therapy: Secondary | ICD-10-CM | POA: Insufficient documentation

## 2020-11-04 DIAGNOSIS — D509 Iron deficiency anemia, unspecified: Secondary | ICD-10-CM | POA: Diagnosis present

## 2020-11-04 DIAGNOSIS — D569 Thalassemia, unspecified: Secondary | ICD-10-CM

## 2020-11-04 DIAGNOSIS — F17210 Nicotine dependence, cigarettes, uncomplicated: Secondary | ICD-10-CM | POA: Diagnosis not present

## 2020-11-04 DIAGNOSIS — N7093 Salpingitis and oophoritis, unspecified: Secondary | ICD-10-CM

## 2020-11-04 LAB — CBC
Hematocrit: 38.7 % (ref 34.0–46.6)
Hemoglobin: 11.6 g/dL (ref 11.1–15.9)
MCH: 19.6 pg — ABNORMAL LOW (ref 26.6–33.0)
MCHC: 30 g/dL — ABNORMAL LOW (ref 31.5–35.7)
MCV: 66 fL — ABNORMAL LOW (ref 79–97)
Platelets: 748 10*3/uL — ABNORMAL HIGH (ref 150–450)
RBC: 5.91 x10E6/uL — ABNORMAL HIGH (ref 3.77–5.28)
RDW: 25.7 % — ABNORMAL HIGH (ref 11.7–15.4)
WBC: 12.7 10*3/uL — ABNORMAL HIGH (ref 3.4–10.8)

## 2020-11-04 LAB — CBC WITH DIFFERENTIAL/PLATELET
Abs Immature Granulocytes: 0.04 10*3/uL (ref 0.00–0.07)
Basophils Absolute: 0.1 10*3/uL (ref 0.0–0.1)
Basophils Relative: 0 %
Eosinophils Absolute: 0.4 10*3/uL (ref 0.0–0.5)
Eosinophils Relative: 4 %
HCT: 35.4 % — ABNORMAL LOW (ref 36.0–46.0)
Hemoglobin: 10.5 g/dL — ABNORMAL LOW (ref 12.0–15.0)
Immature Granulocytes: 0 %
Lymphocytes Relative: 9 %
Lymphs Abs: 1.1 10*3/uL (ref 0.7–4.0)
MCH: 19.3 pg — ABNORMAL LOW (ref 26.0–34.0)
MCHC: 29.7 g/dL — ABNORMAL LOW (ref 30.0–36.0)
MCV: 65.2 fL — ABNORMAL LOW (ref 80.0–100.0)
Monocytes Absolute: 1.1 10*3/uL — ABNORMAL HIGH (ref 0.1–1.0)
Monocytes Relative: 8 %
Neutro Abs: 9.9 10*3/uL — ABNORMAL HIGH (ref 1.7–7.7)
Neutrophils Relative %: 79 %
Platelets: 734 10*3/uL — ABNORMAL HIGH (ref 150–400)
RBC: 5.43 MIL/uL — ABNORMAL HIGH (ref 3.87–5.11)
RDW: 27.3 % — ABNORMAL HIGH (ref 11.5–15.5)
WBC: 12.5 10*3/uL — ABNORMAL HIGH (ref 4.0–10.5)
nRBC: 0 % (ref 0.0–0.2)

## 2020-11-04 LAB — SEDIMENTATION RATE: Sed Rate: 46 mm/hr — ABNORMAL HIGH (ref 0–22)

## 2020-11-04 NOTE — Assessment & Plan Note (Signed)
She has significant microcytosis despite improvement of hemoglobin I suspect she might have thalassemia She has a family history of anemia I discussed with the patient's importance of getting this evaluated with hemoglobin electrophoresis and she is in agreement I will set up appointment in 2 weeks to discuss test results with her

## 2020-11-04 NOTE — Telephone Encounter (Signed)
Scheduled appts per 11/18 sch msg. Gave pt a print out of AVS.

## 2020-11-04 NOTE — Assessment & Plan Note (Signed)
I would defer management of tubo-ovarian abscess to her gynecologist I recommend the patient to stay on antibiotics as directed

## 2020-11-04 NOTE — Progress Notes (Signed)
[facility] CONSULT NOTE  Patient Care Team: Patient, No Pcp Per as PCP - General (General Practice)  CHIEF COMPLAINTS/PURPOSE OF CONSULTATION:  Microcytic anemia with thrombocytosis, for further management  HISTORY OF PRESENTING ILLNESS:  Rebecca Bell 19 y.o. female is here because of recent diagnosis of severe anemia She was supposed to be seen several weeks ago but was admitted to the hospital after presentation with tubo-ovarian abscess Her anemia was so severe when she was hospitalized to the point that she reduce require blood transfusion support Since discharge from the hospital, she was placed on antibiotic therapy and oral iron supplement  She was found to have abnormal CBC from recent blood draw The patient told me she has been anemic all her life There is strong family history of anemia in her mother Review of her recent CBC showed her hemoglobin ranged from 7.2-11.6 Her platelet count was high between 678,000-919,000 She has chronic heavy menstruation.  She usually have 5 days of menstrual bleeding with a cycle of every 28 days  She denies recent chest pain on exertion, shortness of breath on minimal exertion, pre-syncopal episodes, or palpitations. She has fatigue, occasional dizziness and lightheadedness She had not noticed any recent bleeding such as epistaxis, hematuria or hematochezia The patient takes intermittent NSAID for pelvic pain. She is not on antiplatelets agents.  She had no prior history or diagnosis of cancer. Her age appropriate screening programs are up-to-date. She has pica with excessive craving for ice. She eats a variety of diet.  The patient was prescribed oral iron supplements and she takes twice daily before or with meals She has significant pain since discharge from the hospital with further plan for evaluation She is contemplating of stopping her antibiotics because it was not helpful  MEDICAL HISTORY:  Past Medical History:   Diagnosis Date  . Abdominal pain   . Ovarian cyst     SURGICAL HISTORY: Past Surgical History:  Procedure Laterality Date  . TIBIA FRACTURE SURGERY      SOCIAL HISTORY: Social History   Socioeconomic History  . Marital status: Single    Spouse name: Not on file  . Number of children: Not on file  . Years of education: Not on file  . Highest education level: Not on file  Occupational History  . Occupation: Scientist, water quality  Tobacco Use  . Smoking status: Current Every Day Smoker  . Smokeless tobacco: Never Used  Substance and Sexual Activity  . Alcohol use: Yes    Comment: occ  . Drug use: Not on file    Comment: last time 1 month ago  . Sexual activity: Not on file  Other Topics Concern  . Not on file  Social History Narrative  . Not on file   Social Determinants of Health   Financial Resource Strain:   . Difficulty of Paying Living Expenses: Not on file  Food Insecurity: No Food Insecurity  . Worried About Charity fundraiser in the Last Year: Never true  . Ran Out of Food in the Last Year: Never true  Transportation Needs: No Transportation Needs  . Lack of Transportation (Medical): No  . Lack of Transportation (Non-Medical): No  Physical Activity:   . Days of Exercise per Week: Not on file  . Minutes of Exercise per Session: Not on file  Stress:   . Feeling of Stress : Not on file  Social Connections:   . Frequency of Communication with Friends and Family: Not on file  .  Frequency of Social Gatherings with Friends and Family: Not on file  . Attends Religious Services: Not on file  . Active Member of Clubs or Organizations: Not on file  . Attends Archivist Meetings: Not on file  . Marital Status: Not on file  Intimate Partner Violence: Not At Risk  . Fear of Current or Ex-Partner: No  . Emotionally Abused: No  . Physically Abused: No  . Sexually Abused: No    FAMILY HISTORY: History reviewed. No pertinent family history.  ALLERGIES:  has No  Known Allergies.  MEDICATIONS:  Current Outpatient Medications  Medication Sig Dispense Refill  . doxycycline (VIBRA-TABS) 100 MG tablet Take 1 tablet (100 mg total) by mouth every 12 (twelve) hours. 24 tablet 0  . ferrous sulfate (FERROUSUL) 325 (65 FE) MG tablet Take 1 tablet (325 mg total) by mouth 2 (two) times daily. 60 tablet 1  . ibuprofen (ADVIL) 600 MG tablet Take 1 tablet (600 mg total) by mouth every 6 (six) hours as needed for cramping. 30 tablet 3  . metroNIDAZOLE (FLAGYL) 500 MG tablet Take 1 tablet (500 mg total) by mouth every 12 (twelve) hours. 24 tablet 0  . oxyCODONE-acetaminophen (PERCOCET/ROXICET) 5-325 MG tablet Take 1 tablet by mouth every 4 (four) hours as needed (moderate to severe pain (when tolerating fluids)). 20 tablet 0   No current facility-administered medications for this visit.    REVIEW OF SYSTEMS:   Constitutional: Denies fevers, chills or abnormal night sweats Eyes: Denies blurriness of vision, double vision or watery eyes Ears, nose, mouth, throat, and face: Denies mucositis or sore throat Respiratory: Denies cough, dyspnea or wheezes Cardiovascular: Denies palpitation, chest discomfort or lower extremity swelling Gastrointestinal:  Denies nausea, heartburn or change in bowel habits Skin: Denies abnormal skin rashes Lymphatics: Denies new lymphadenopathy or easy bruising Neurological:Denies numbness, tingling or new weaknesses Behavioral/Psych: Mood is stable, no new changes  All other systems were reviewed with the patient and are negative.  PHYSICAL EXAMINATION: ECOG PERFORMANCE STATUS: 1 - Symptomatic but completely ambulatory  Vitals:   11/04/20 1320  BP: 103/67  Pulse: (!) 124  Resp: 18  Temp: 99 F (37.2 C)  SpO2: 97%   Filed Weights   11/04/20 1320  Weight: 112 lb 3.2 oz (50.9 kg)    GENERAL:alert, no distress and comfortable SKIN: skin color, texture, turgor are normal, no rashes or significant lesions EYES: normal,  conjunctiva are pink and non-injected, sclera clear OROPHARYNX:no exudate, no erythema and lips, buccal mucosa, and tongue normal  NECK: supple, thyroid normal size, non-tender, without nodularity LYMPH:  no palpable lymphadenopathy in the cervical, axillary or inguinal LUNGS: clear to auscultation and percussion with normal breathing effort HEART: regular rate & rhythm and no murmurs and no lower extremity edema ABDOMEN:abdomen soft, non-tender and normal bowel sounds Musculoskeletal:no cyanosis of digits and no clubbing  PSYCH: alert & oriented x 3 with fluent speech NEURO: no focal motor/sensory deficits  RADIOGRAPHIC STUDIES: I have personally reviewed the radiological images as listed and agreed with the findings in the report. CT PELVIS W CONTRAST  Result Date: 10/18/2020 CLINICAL DATA:  Tubo-ovarian abscess EXAM: CT PELVIS WITH CONTRAST TECHNIQUE: Multidetector CT imaging of the pelvis was performed using the standard protocol following the bolus administration of intravenous contrast. CONTRAST:  122m OMNIPAQUE IOHEXOL 300 MG/ML  SOLN COMPARISON:  MR pelvis dated 10/17/2020. Ultrasound pelvis dated 10/14/2020. FINDINGS: Urinary Tract:  Bladder is within normal limits. Bowel: Long segment wall thickening involving the distal/terminal ileum (series  3/image 15), underdistended but abnormal. Vascular/Lymphatic: No evidence of aneurysm. No suspicious pelvic lymphadenopathy. Reproductive:  Uterus is within normal limits. Right ovary is likely within normal limits (series 3/image 20). 3.1 x 3.7 cm irregular enhancing lesion in the left ovary (series 3/image 20; sagittal image 92). Other: Small to moderate pelvic ascites, mildly masslike. Associated fluid/ascites along the small bowel mesentery (series 3/images 11 and 14). Musculoskeletal: Visualized osseous structures are within normal limits. IMPRESSION: 3.7 cm irregular enhancing lesion in the left ovary, favoring a tubo-ovarian abscess on recent  MRI. Long segment wall thickening involving the distal/terminal ileum, abnormal. This appearance may be reactive given the left adnexal process, but infectious/inflammatory ileitis is not excluded. At a minimum, GI follow-up after resolution of the pelvic inflammatory process is suggested. Small to moderate pelvic ascites, mildly masslike. Associated fluid/ascites along the small bowel mesentery. Electronically Signed   By: Julian Hy M.D.   On: 10/18/2020 13:35   MR PELVIS W WO CONTRAST  Result Date: 10/18/2020 CLINICAL DATA:  Pelvic mass on ultrasound EXAM: MRI PELVIS WITHOUT AND WITH CONTRAST TECHNIQUE: Multiplanar multisequence MR imaging of the pelvis was performed both before and after administration of intravenous contrast. CONTRAST:  5m GADAVIST GADOBUTROL 1 MMOL/ML IV SOLN COMPARISON:  Pelvic ultrasound dated 10/06/2020 FINDINGS: Urinary Tract:  Bladder is within normal limits. Bowel: Long segment wall thickening involving the distal/terminal ileum (series 5/images 10 and 13), nonspecific. Vascular/Lymphatic: No evidence of aneurysm. No suspicious pelvic lymphadenopathy. Reproductive:  Uterus is within normal limits. Right ovary is within normal limits. 3.8 x 3.8 x 2.9 cm peripherally enhancing left ovarian mass (series 3/image 16). No significant intrinsic T1 hyperintensity on precontrast imaging. As such, this appearance favors a tubo-ovarian abscess over a hemorrhagic corpus luteum. Other: Moderate pelvic ascites, partially loculated with mass effect in the dependent pelvis. Musculoskeletal: No focal osseous lesions. IMPRESSION: 3.8 cm left ovarian mass, favoring a small tubo-ovarian abscess. Long segment wall thickening involving the distal/terminal ileum, nonspecific and likely reactive given the left adnexal inflammatory process favored above. Associated moderate pelvic ascites, partially loculated with mass-effect in the dependent pelvis. Electronically Signed   By: SJulian HyM.D.    On: 10/18/2020 07:27   CT ASPIRATION  Result Date: 10/19/2020 CLINICAL DATA:  Pelvic fluid and possible left tubo-ovarian abscess. EXAM: CT GUIDED ASPIRATION OF PELVIC FLUID COLLECTION ANESTHESIA/SEDATION: 2.0 mg IV Versed 75 mcg IV Fentanyl Total Moderate Sedation Time:  19 minutes The patient's level of consciousness and physiologic status were continuously monitored during the procedure by Radiology nursing. PROCEDURE: The procedure, risks, benefits, and alternatives were explained to the patient. Questions regarding the procedure were encouraged and answered. The patient understands and consents to the procedure. A time out was performed prior to initiating the procedure. CT of the pelvis was performed in a prone position. The left gluteal region was prepped with chlorhexidine in a sterile fashion, and a sterile drape was applied covering the operative field. A sterile gown and sterile gloves were used for the procedure. Local anesthesia was provided with 1% Lidocaine. From a posterior left transgluteal approach, a 5 [place] centesis catheter was advanced over a 19 gauge needle under CT guidance to the level of a posterior pelvic fluid collection. After confirming catheter position, aspiration was performed initially with a syringe for a sample which was sent for fluid cultures followed by vacuum bottle evacuation. The catheter was removed and additional CT performed. COMPLICATIONS: None FINDINGS: Aspiration at the level of the posterior pelvic fluid collection  located in the cul-de-sac yielded clear, yellow fluid. A sample was sent for culture analysis. A total of 280 mL of fluid was able to be removed resulting in near complete decompression of the fluid collection by CT. There is no safe anterior or posterior percutaneous window to the level of the left ovary. IMPRESSION: CT-guided aspiration of pelvic fluid collection. A sample of clear, yellow fluid was sent for culture analysis. A total of 280 mL  of fluid was able to be removed resulting in near complete decompression of the fluid collection by CT. Electronically Signed   By: Aletta Edouard M.D.   On: 10/19/2020 13:40   US PELVIC DOPPLER (TORSION R/O OR MASS ARTERIAL FLOW)  Result Date: 10/14/2020 CLINICAL DATA:  Severe pelvic pain for 1 week. Last menstrual period 09/23/2020. EXAM: TRANSABDOMINAL ULTRASOUND OF PELVIS DOPPLER ULTRASOUND OF OVARIES TECHNIQUE: Transabdominal ultrasound examination of the pelvis was performed including evaluation of the uterus, ovaries, adnexal regions, and pelvic cul-de-sac. Color and duplex Doppler ultrasound was utilized to evaluate blood flow to the ovaries. COMPARISON:  None. FINDINGS: Measurements: 6.3 x 2.4 x 3.4 cm = volume: 27 mL. No fibroids or other mass visualized. Endometrium Thickness: 3 mm.  No focal abnormality visualized. Right ovary Measurements: 5.9 x 4.1 x 4.3 cm = volume: 54 mL. A right adnexal mass has multiple thin septations and measures 10.0 x 6.1 x 7.2 cm. Left ovary Measurements: 5.0 x 4.7 x 5.3 cm = volume: 65 mL. The left ovary has a heterogeneous appearance. Pulsed Doppler evaluation demonstrates normal low-resistance arterial and venous waveforms in both ovaries. Other findings: A heterogeneous soft tissue mass superior to the uterus measures 8.0 x 4.8 x 8.6 cm and demonstrates internal blood flow. Small volume free fluid is seen in the pelvis. IMPRESSION: 1. Enlarged and heterogeneous appearing left ovary may reflect infection or torsion. 2. Large cystic mass in the vicinity of the right adnexa may reflect hydrosalpinx, pyosalpinx, or an adnexal cyst with multiple thin septations. 3. Heterogeneous soft tissue mass superior to the uterus is nonspecific and may reflect a tubo-ovarian abscess. These results were called by telephone at the time of interpretation on 10/14/2020 at 2:12 pm to the patient's provider, who verbally acknowledged these results. Electronically Signed   By: Zerita Boers  M.D.   On: 10/14/2020 14:12    ASSESSMENT & PLAN:  Tubo-ovarian abscess I would defer management of tubo-ovarian abscess to her gynecologist I recommend the patient to stay on antibiotics as directed  Iron deficiency anemia She is known to have iron deficiency anemia She has been taking oral iron supplement for long time I will check iron studies to see if she needs more aggressive iron supplement intravenously  Thrombocytosis The thrombocytosis is likely reactive in nature, related to recent infection or iron deficiency Observe for now  Thalassemia, unspecified She has significant microcytosis despite improvement of hemoglobin I suspect she might have thalassemia She has a family history of anemia I discussed with the patient's importance of getting this evaluated with hemoglobin electrophoresis and she is in agreement I will set up appointment in 2 weeks to discuss test results with her  Orders Placed This Encounter  Procedures  . CBC with Differential/Platelet    Standing Status:   Standing    Number of Occurrences:   22    Standing Expiration Date:   11/04/2021  . Iron and TIBC    Standing Status:   Future    Standing Expiration Date:   11/04/2021  .  Reticulocytes ([place])    Standing Status:   Future    Standing Expiration Date:   11/04/2021  . Sedimentation rate    Standing Status:   Future    Standing Expiration Date:   11/04/2021  . Ferritin    Standing Status:   Future    Standing Expiration Date:   11/04/2021  . Hgb Fractionation [place]    All questions were answered. The patient knows to call the clinic with any problems, questions or concerns.  The total time spent in the appointment was 55 minutes encounter with patients including review of chart and various tests results, discussions about plan of care and coordination of care plan  Heath Lark, MD 11/18/20211:41 PM       Heath Lark, MD 11/04/20 1:41 PM

## 2020-11-04 NOTE — Assessment & Plan Note (Signed)
The thrombocytosis is likely reactive in nature, related to recent infection or iron deficiency Observe for now

## 2020-11-04 NOTE — Assessment & Plan Note (Signed)
She is known to have iron deficiency anemia She has been taking oral iron supplement for long time I will check iron studies to see if she needs more aggressive iron supplement intravenously

## 2020-11-05 LAB — FERRITIN: Ferritin: 203 ng/mL (ref 11–307)

## 2020-11-05 LAB — IRON AND TIBC
Iron: 11 ug/dL — ABNORMAL LOW (ref 41–142)
Saturation Ratios: 7 % — ABNORMAL LOW (ref 21–57)
TIBC: 159 ug/dL — ABNORMAL LOW (ref 236–444)
UIBC: 147 ug/dL (ref 120–384)

## 2020-11-08 ENCOUNTER — Other Ambulatory Visit: Payer: Self-pay

## 2020-11-08 ENCOUNTER — Ambulatory Visit (HOSPITAL_COMMUNITY)
Admission: RE | Admit: 2020-11-08 | Discharge: 2020-11-08 | Disposition: A | Discharge status: Home / Self Care | Payer: Medicaid Other | Source: Ambulatory Visit | Attending: Obstetrics and Gynecology | Admitting: Obstetrics and Gynecology

## 2020-11-08 DIAGNOSIS — N7093 Salpingitis and oophoritis, unspecified: Secondary | ICD-10-CM | POA: Insufficient documentation

## 2020-11-08 LAB — HGB FRACTIONATION CASCADE
Hgb A2: 2.6 % (ref 1.8–3.2)
Hgb A: 97.4 % (ref 96.4–98.8)
Hgb F: 0 % (ref 0.0–2.0)
Hgb S: 0 %

## 2020-11-10 ENCOUNTER — Encounter (HOSPITAL_COMMUNITY): Payer: Self-pay | Admitting: Obstetrics and Gynecology

## 2020-11-10 ENCOUNTER — Observation Stay (HOSPITAL_COMMUNITY): Payer: Medicaid Other

## 2020-11-10 ENCOUNTER — Inpatient Hospital Stay (HOSPITAL_COMMUNITY)
Admission: AD | Admit: 2020-11-10 | Discharge: 2020-11-19 | DRG: 757 | Disposition: A | Discharge status: Home / Self Care | Payer: Medicaid Other | Source: Ambulatory Visit | Attending: Obstetrics & Gynecology | Admitting: Obstetrics & Gynecology

## 2020-11-10 ENCOUNTER — Other Ambulatory Visit: Payer: Self-pay

## 2020-11-10 ENCOUNTER — Ambulatory Visit (INDEPENDENT_AMBULATORY_CARE_PROVIDER_SITE_OTHER): Payer: Self-pay | Admitting: Obstetrics and Gynecology

## 2020-11-10 ENCOUNTER — Encounter: Payer: Self-pay | Admitting: Obstetrics and Gynecology

## 2020-11-10 VITALS — BP 101/68 | HR 104 | Wt 113.5 lb

## 2020-11-10 DIAGNOSIS — N83291 Other ovarian cyst, right side: Secondary | ICD-10-CM | POA: Diagnosis present

## 2020-11-10 DIAGNOSIS — K50013 Crohn's disease of small intestine with fistula: Secondary | ICD-10-CM

## 2020-11-10 DIAGNOSIS — Z79899 Other long term (current) drug therapy: Secondary | ICD-10-CM

## 2020-11-10 DIAGNOSIS — R188 Other ascites: Secondary | ICD-10-CM | POA: Diagnosis present

## 2020-11-10 DIAGNOSIS — R630 Anorexia: Secondary | ICD-10-CM | POA: Diagnosis present

## 2020-11-10 DIAGNOSIS — R700 Elevated erythrocyte sedimentation rate: Secondary | ICD-10-CM | POA: Diagnosis present

## 2020-11-10 DIAGNOSIS — N83209 Unspecified ovarian cyst, unspecified side: Secondary | ICD-10-CM | POA: Diagnosis present

## 2020-11-10 DIAGNOSIS — Z20822 Contact with and (suspected) exposure to covid-19: Secondary | ICD-10-CM | POA: Diagnosis present

## 2020-11-10 DIAGNOSIS — K651 Peritoneal abscess: Secondary | ICD-10-CM | POA: Diagnosis present

## 2020-11-10 DIAGNOSIS — L0291 Cutaneous abscess, unspecified: Secondary | ICD-10-CM

## 2020-11-10 DIAGNOSIS — R102 Pelvic and perineal pain: Secondary | ICD-10-CM

## 2020-11-10 DIAGNOSIS — F17200 Nicotine dependence, unspecified, uncomplicated: Secondary | ICD-10-CM | POA: Diagnosis present

## 2020-11-10 DIAGNOSIS — R1030 Lower abdominal pain, unspecified: Secondary | ICD-10-CM

## 2020-11-10 DIAGNOSIS — R110 Nausea: Secondary | ICD-10-CM

## 2020-11-10 DIAGNOSIS — N7093 Salpingitis and oophoritis, unspecified: Principal | ICD-10-CM

## 2020-11-10 LAB — CBC WITH DIFFERENTIAL/PLATELET
Abs Immature Granulocytes: 0 10*3/uL (ref 0.00–0.07)
Basophils Absolute: 0 10*3/uL (ref 0.0–0.1)
Basophils Relative: 0 %
Eosinophils Absolute: 0.6 10*3/uL — ABNORMAL HIGH (ref 0.0–0.5)
Eosinophils Relative: 5 %
HCT: 34.2 % — ABNORMAL LOW (ref 36.0–46.0)
Hemoglobin: 10 g/dL — ABNORMAL LOW (ref 12.0–15.0)
Lymphocytes Relative: 12 %
Lymphs Abs: 1.5 10*3/uL (ref 0.7–4.0)
MCH: 19.4 pg — ABNORMAL LOW (ref 26.0–34.0)
MCHC: 29.2 g/dL — ABNORMAL LOW (ref 30.0–36.0)
MCV: 66.3 fL — ABNORMAL LOW (ref 80.0–100.0)
Monocytes Absolute: 0.6 10*3/uL (ref 0.1–1.0)
Monocytes Relative: 5 %
Neutro Abs: 9.6 10*3/uL — ABNORMAL HIGH (ref 1.7–7.7)
Neutrophils Relative %: 78 %
Platelets: 699 10*3/uL — ABNORMAL HIGH (ref 150–400)
RBC: 5.16 MIL/uL — ABNORMAL HIGH (ref 3.87–5.11)
RDW: 26.8 % — ABNORMAL HIGH (ref 11.5–15.5)
WBC: 12.3 10*3/uL — ABNORMAL HIGH (ref 4.0–10.5)
nRBC: 0 % (ref 0.0–0.2)
nRBC: 0 /100 WBC

## 2020-11-10 LAB — PROTIME-INR
INR: 1.2 (ref 0.8–1.2)
Prothrombin Time: 14.6 seconds (ref 11.4–15.2)

## 2020-11-10 LAB — RAPID HIV SCREEN (HIV 1/2 AB+AG)
HIV 1/2 Antibodies: NONREACTIVE
HIV-1 P24 Antigen - HIV24: NONREACTIVE
Interpretation (HIV Ag Ab): NONREACTIVE

## 2020-11-10 LAB — APTT: aPTT: 38 seconds — ABNORMAL HIGH (ref 24–36)

## 2020-11-10 LAB — HEPATITIS C ANTIBODY: HCV Ab: NONREACTIVE

## 2020-11-10 LAB — RESP PANEL BY RT-PCR (FLU A&B, COVID) ARPGX2
Influenza A by PCR: NEGATIVE
Influenza B by PCR: NEGATIVE
SARS Coronavirus 2 by RT PCR: NEGATIVE

## 2020-11-10 LAB — TYPE AND SCREEN
ABO/RH(D): O POS
Antibody Screen: NEGATIVE

## 2020-11-10 LAB — HEPATITIS B SURFACE ANTIGEN: Hepatitis B Surface Ag: NONREACTIVE

## 2020-11-10 LAB — HCG, QUANTITATIVE, PREGNANCY: hCG, Beta Chain, Quant, S: <1 m[IU]/mL (ref <5)

## 2020-11-10 LAB — FIBRINOGEN: Fibrinogen: 544 mg/dL — ABNORMAL HIGH (ref 210–475)

## 2020-11-10 MED ORDER — SODIUM CHLORIDE 0.9 % IV SOLN: 250.0000 mL | INTRAVENOUS | Status: DC | PRN

## 2020-11-10 MED ORDER — ONDANSETRON HCL 4 MG/2ML IJ SOLN
4.0000 mg | Freq: Four times a day (QID) | INTRAMUSCULAR | Status: DC | PRN
  Administered 2020-11-14 – 2020-11-16 (×3): 4 mg via INTRAVENOUS
  Filled 2020-11-10 (×3): qty 2

## 2020-11-10 MED ORDER — DOXYCYCLINE HYCLATE 100 MG PO TABS
100.0000 mg | ORAL_TABLET | Freq: Two times a day (BID) | ORAL | Status: DC
  Administered 2020-11-10 – 2020-11-11 (×2): 100 mg via ORAL
  Filled 2020-11-10 (×3): qty 1

## 2020-11-10 MED ORDER — DOCUSATE SODIUM 100 MG PO CAPS
100.0000 mg | ORAL_CAPSULE | Freq: Two times a day (BID) | ORAL | Status: DC
  Administered 2020-11-10 – 2020-11-18 (×15): 100 mg via ORAL
  Filled 2020-11-10 (×17): qty 1

## 2020-11-10 MED ORDER — ACETAMINOPHEN 325 MG PO TABS
650.0000 mg | ORAL_TABLET | ORAL | Status: DC | PRN
  Administered 2020-11-10 – 2020-11-12 (×3): 650 mg via ORAL
  Filled 2020-11-10 (×3): qty 2

## 2020-11-10 MED ORDER — SODIUM CHLORIDE 0.9% FLUSH
3.0000 mL | Freq: Two times a day (BID) | INTRAVENOUS | Status: DC
  Administered 2020-11-10 – 2020-11-18 (×12): 3 mL via INTRAVENOUS

## 2020-11-10 MED ORDER — POLYETHYLENE GLYCOL 3350 17 G PO PACK: 17.0000 g | PACK | Freq: Every day | ORAL | Status: DC | PRN

## 2020-11-10 MED ORDER — MORPHINE SULFATE (PF) 2 MG/ML IV SOLN
2.0000 mg | INTRAVENOUS | Status: DC | PRN
  Administered 2020-11-10 – 2020-11-17 (×21): 2 mg via INTRAVENOUS
  Filled 2020-11-10 (×22): qty 1

## 2020-11-10 MED ORDER — ONDANSETRON HCL 4 MG PO TABS: 4.0000 mg | ORAL_TABLET | Freq: Four times a day (QID) | ORAL | Status: DC | PRN

## 2020-11-10 MED ORDER — SODIUM CHLORIDE 0.9 % IV SOLN
2.0000 g | Freq: Four times a day (QID) | INTRAVENOUS | Status: DC
  Administered 2020-11-10 – 2020-11-11 (×4): 2 g via INTRAVENOUS
  Filled 2020-11-10 (×6): qty 2

## 2020-11-10 MED ORDER — SODIUM CHLORIDE 0.9% FLUSH: 3.0000 mL | INTRAVENOUS | Status: DC | PRN

## 2020-11-10 MED ORDER — OXYCODONE HCL 5 MG PO TABS
5.0000 mg | ORAL_TABLET | ORAL | Status: DC | PRN
  Administered 2020-11-10 – 2020-11-13 (×7): 5 mg via ORAL
  Filled 2020-11-10 (×7): qty 1

## 2020-11-10 MED ORDER — IOHEXOL 300 MG/ML  SOLN
100.0000 mL | Freq: Once | INTRAMUSCULAR | Status: AC | PRN
  Administered 2020-11-10: 100 mL via INTRAVENOUS

## 2020-11-10 MED ORDER — SIMETHICONE 80 MG PO CHEW: 80.0000 mg | CHEWABLE_TABLET | Freq: Four times a day (QID) | ORAL | Status: DC | PRN

## 2020-11-10 NOTE — H&P (Signed)
OB/GYN History and Physical  Rebecca Bell is a 19 y.o. G0P0000 presenting for admission for ongoing pelvic pain in the setting of enlarging TOA/pelvic fluid collection. Reports ongoing pain in lower abdomen that is worse than when she left hospital. Denies fever/chills, nausea/vomiting. Does report poor appetite and loose stools. No other complaints.   She was admitted 10/28-11/3 for same and underwent drainage by IR on 11/2 (280 mLs) with near resolution of collection. No drain left in place, no organisms grew out. At follow up visit, she was put back on doxy/flagyl and ordered for f/u US as she had ongoing pain. Seen today for urgent visit due to results.       Past Medical History:  Diagnosis Date  . Abdominal pain   . Ovarian cyst     Past Surgical History:  Procedure Laterality Date  . TIBIA FRACTURE SURGERY      OB History  Gravida Para Term Preterm AB Living  0 0 0 0 0 0  SAB TAB Ectopic Multiple Live Births  0 0 0 0 0    Social History   Socioeconomic History  . Marital status: Single    Spouse name: Not on file  . Number of children: Not on file  . Years of education: Not on file  . Highest education level: Not on file  Occupational History  . Occupation: Scientist, water quality  Tobacco Use  . Smoking status: Current Every Day Smoker  . Smokeless tobacco: Never Used  Substance and Sexual Activity  . Alcohol use: Yes    Comment: occ  . Drug use: Not on file    Comment: last time 1 month ago  . Sexual activity: Not on file  Other Topics Concern  . Not on file  Social History Narrative  . Not on file   Social Determinants of Health   Financial Resource Strain:   . Difficulty of Paying Living Expenses: Not on file  Food Insecurity: No Food Insecurity  . Worried About Charity fundraiser in the Last Year: Never true  . Ran Out of Food in the Last Year: Never true  Transportation Needs: No Transportation Needs  . Lack of Transportation (Medical): No  . Lack of  Transportation (Non-Medical): No  Physical Activity:   . Days of Exercise per Week: Not on file  . Minutes of Exercise per Session: Not on file  Stress:   . Feeling of Stress : Not on file  Social Connections:   . Frequency of Communication with Friends and Family: Not on file  . Frequency of Social Gatherings with Friends and Family: Not on file  . Attends Religious Services: Not on file  . Active Member of Clubs or Organizations: Not on file  . Attends Archivist Meetings: Not on file  . Marital Status: Not on file    No family history on file.  Medications Prior to Admission  Medication Sig Dispense Refill Last Dose  . doxycycline (VIBRA-TABS) 100 MG tablet Take 1 tablet (100 mg total) by mouth every 12 (twelve) hours. 24 tablet 0   . ferrous sulfate (FERROUSUL) 325 (65 FE) MG tablet Take 1 tablet (325 mg total) by mouth 2 (two) times daily. 60 tablet 1   . ibuprofen (ADVIL) 600 MG tablet Take 1 tablet (600 mg total) by mouth every 6 (six) hours as needed for cramping. 30 tablet 3   . metroNIDAZOLE (FLAGYL) 500 MG tablet Take 1 tablet (500 mg total) by mouth every 12 (twelve)  hours. 24 tablet 0   . oxyCODONE-acetaminophen (PERCOCET/ROXICET) 5-325 MG tablet Take 1 tablet by mouth every 4 (four) hours as needed (moderate to severe pain (when tolerating fluids)). 20 tablet 0     No Known Allergies  Review of Systems: Negative except for what is mentioned in HPI.     Physical Exam: BP 112/64 (BP Location: Right Arm)   Pulse 86   Temp 97.9 F (36.6 C) (Oral)   Resp 17   Ht 5' 5"  (1.651 m)   Wt 51.4 kg   LMP 11/06/2020   SpO2 100%   BMI 18.86 kg/m  CONSTITUTIONAL: Well-developed, well-nourished female in no acute distress.  HENT:  Normocephalic, atraumatic, External right and left ear normal. Oropharynx is clear and moist EYES: Conjunctivae and EOM are normal. Pupils are equal, round, and reactive to light. No scleral icterus.  NECK: Normal range of motion,  supple, no masses SKIN: Skin is warm and dry. No rash noted. Not diaphoretic. No erythema. No pallor. [place]: Alert and oriented to person, place, and time. Normal reflexes, muscle tone coordination. No cranial nerve deficit noted. PSYCHIATRIC: Normal mood and affect. Normal behavior. Normal judgment and thought content. CARDIOVASCULAR: Normal heart rate noted RESPIRATORY: Effort normal, no problems with respiration noted ABDOMEN: Soft, moderately tender in lower quadrants, nondistended PELVIC: Deferred MUSCULOSKELETAL: Normal range of motion. No edema and no tenderness. 2+ distal pulses.   Pertinent Labs/Studies:   No results found for this or any previous visit (from the past 72 hour(s)).  CT PELVIS W CONTRAST  Result Date: 10/18/2020 CLINICAL DATA:  Tubo-ovarian abscess EXAM: CT PELVIS WITH CONTRAST TECHNIQUE: Multidetector CT imaging of the pelvis was performed using the standard protocol following the bolus administration of intravenous contrast. CONTRAST:  163m OMNIPAQUE IOHEXOL 300 MG/ML  SOLN COMPARISON:  MR pelvis dated 10/17/2020. Ultrasound pelvis dated 10/14/2020. FINDINGS: Urinary Tract:  Bladder is within normal limits. Bowel: Long segment wall thickening involving the distal/terminal ileum (series 3/image 15), underdistended but abnormal. Vascular/Lymphatic: No evidence of aneurysm. No suspicious pelvic lymphadenopathy. Reproductive:  Uterus is within normal limits. Right ovary is likely within normal limits (series 3/image 20). 3.1 x 3.7 cm irregular enhancing lesion in the left ovary (series 3/image 20; sagittal image 92). Other: Small to moderate pelvic ascites, mildly masslike. Associated fluid/ascites along the small bowel mesentery (series 3/images 11 and 14). Musculoskeletal: Visualized osseous structures are within normal limits. IMPRESSION: 3.7 cm irregular enhancing lesion in the left ovary, favoring a tubo-ovarian abscess on recent MRI. Long segment wall thickening  involving the distal/terminal ileum, abnormal. This appearance may be reactive given the left adnexal process, but infectious/inflammatory ileitis is not excluded. At a minimum, GI follow-up after resolution of the pelvic inflammatory process is suggested. Small to moderate pelvic ascites, mildly masslike. Associated fluid/ascites along the small bowel mesentery. Electronically Signed   By: SJulian HyM.D.   On: 10/18/2020 13:35   MR PELVIS W WO CONTRAST  Result Date: 10/18/2020 CLINICAL DATA:  Pelvic mass on ultrasound EXAM: MRI PELVIS WITHOUT AND WITH CONTRAST TECHNIQUE: Multiplanar multisequence MR imaging of the pelvis was performed both before and after administration of intravenous contrast. CONTRAST:  538mGADAVIST GADOBUTROL 1 MMOL/ML IV SOLN COMPARISON:  Pelvic ultrasound dated 10/06/2020 FINDINGS: Urinary Tract:  Bladder is within normal limits. Bowel: Long segment wall thickening involving the distal/terminal ileum (series 5/images 10 and 13), nonspecific. Vascular/Lymphatic: No evidence of aneurysm. No suspicious pelvic lymphadenopathy. Reproductive:  Uterus is within normal limits. Right ovary is within normal  limits. 3.8 x 3.8 x 2.9 cm peripherally enhancing left ovarian mass (series 3/image 16). No significant intrinsic T1 hyperintensity on precontrast imaging. As such, this appearance favors a tubo-ovarian abscess over a hemorrhagic corpus luteum. Other: Moderate pelvic ascites, partially loculated with mass effect in the dependent pelvis. Musculoskeletal: No focal osseous lesions. IMPRESSION: 3.8 cm left ovarian mass, favoring a small tubo-ovarian abscess. Long segment wall thickening involving the distal/terminal ileum, nonspecific and likely reactive given the left adnexal inflammatory process favored above. Associated moderate pelvic ascites, partially loculated with mass-effect in the dependent pelvis. Electronically Signed   By: Julian Hy M.D.   On: 10/18/2020 07:27   CT  ASPIRATION  Result Date: 10/19/2020 CLINICAL DATA:  Pelvic fluid and possible left tubo-ovarian abscess. EXAM: CT GUIDED ASPIRATION OF PELVIC FLUID COLLECTION ANESTHESIA/SEDATION: 2.0 mg IV Versed 75 mcg IV Fentanyl Total Moderate Sedation Time:  19 minutes The patient's level of consciousness and physiologic status were continuously monitored during the procedure by Radiology nursing. PROCEDURE: The procedure, risks, benefits, and alternatives were explained to the patient. Questions regarding the procedure were encouraged and answered. The patient understands and consents to the procedure. A time out was performed prior to initiating the procedure. CT of the pelvis was performed in a prone position. The left gluteal region was prepped with chlorhexidine in a sterile fashion, and a sterile drape was applied covering the operative field. A sterile gown and sterile gloves were used for the procedure. Local anesthesia was provided with 1% Lidocaine. From a posterior left transgluteal approach, a 5 [place] Yueh centesis catheter was advanced over a 19 gauge needle under CT guidance to the level of a posterior pelvic fluid collection. After confirming catheter position, aspiration was performed initially with a syringe for a sample which was sent for fluid cultures followed by vacuum bottle evacuation. The catheter was removed and additional CT performed. COMPLICATIONS: None FINDINGS: Aspiration at the level of the posterior pelvic fluid collection located in the cul-de-sac yielded clear, yellow fluid. A sample was sent for culture analysis. A total of 280 mL of fluid was able to be removed resulting in near complete decompression of the fluid collection by CT. There is no safe anterior or posterior percutaneous window to the level of the left ovary. IMPRESSION: CT-guided aspiration of pelvic fluid collection. A sample of clear, yellow fluid was sent for culture analysis. A total of 280 mL of fluid was able to be  removed resulting in near complete decompression of the fluid collection by CT. Electronically Signed   By: Aletta Edouard M.D.   On: 10/19/2020 13:40   US PELVIC DOPPLER (TORSION R/O OR MASS ARTERIAL FLOW)  Result Date: 10/14/2020 CLINICAL DATA:  Severe pelvic pain for 1 week. Last menstrual period 09/23/2020. EXAM: TRANSABDOMINAL ULTRASOUND OF PELVIS DOPPLER ULTRASOUND OF OVARIES TECHNIQUE: Transabdominal ultrasound examination of the pelvis was performed including evaluation of the uterus, ovaries, adnexal regions, and pelvic cul-de-sac. Color and duplex Doppler ultrasound was utilized to evaluate blood flow to the ovaries. COMPARISON:  None. FINDINGS: Measurements: 6.3 x 2.4 x 3.4 cm = volume: 27 mL. No fibroids or other mass visualized. Endometrium Thickness: 3 mm.  No focal abnormality visualized. Right ovary Measurements: 5.9 x 4.1 x 4.3 cm = volume: 54 mL. A right adnexal mass has multiple thin septations and measures 10.0 x 6.1 x 7.2 cm. Left ovary Measurements: 5.0 x 4.7 x 5.3 cm = volume: 65 mL. The left ovary has a heterogeneous appearance. Pulsed Doppler evaluation demonstrates  normal low-resistance arterial and venous waveforms in both ovaries. Other findings: A heterogeneous soft tissue mass superior to the uterus measures 8.0 x 4.8 x 8.6 cm and demonstrates internal blood flow. Small volume free fluid is seen in the pelvis. IMPRESSION: 1. Enlarged and heterogeneous appearing left ovary may reflect infection or torsion. 2. Large cystic mass in the vicinity of the right adnexa may reflect hydrosalpinx, pyosalpinx, or an adnexal cyst with multiple thin septations. 3. Heterogeneous soft tissue mass superior to the uterus is nonspecific and may reflect a tubo-ovarian abscess. These results were called by telephone at the time of interpretation on 10/14/2020 at 2:12 pm to the patient's provider, who verbally acknowledged these results. Electronically Signed   By: Zerita Boers M.D.   On: 10/14/2020  14:12   US PELVIC COMPLETE WITH TRANSVAGINAL  Result Date: 11/08/2020 CLINICAL DATA:  Recent tubo-ovarian abscess post drainage on 10/19/2020 and antibiotic therapy, continued pain; LMP 10/30/2020 EXAM: TRANSABDOMINAL AND TRANSVAGINAL ULTRASOUND OF PELVIS TECHNIQUE: Both transabdominal and transvaginal ultrasound examinations of the pelvis were performed. Transabdominal technique was performed for global imaging of the pelvis including uterus, ovaries, adnexal regions, and pelvic cul-de-sac. It was necessary to proceed with endovaginal exam following the transabdominal exam to visualize the uterus and ovaries. COMPARISON:  10/14/2020 Correlation: CT pelvis 10/18/2020, 10/19/2020 FINDINGS: Uterus Measurements: 6.6 x 2.6 x 2.9 cm = volume: 26 mL. Anteverted. Normal morphology without mass Endometrium Thickness: 5 mm.  No endometrial fluid or focal abnormality Right ovary Not definitely visualized, see below Left ovary Measurements: 3.9 x 3.0 x 4.0 cm = volume: 24.1 mL. Questionably visualized, potentially containing a small hemorrhagic corpus luteum 2.6 cm diameter. Other findings Large complex multiloculated fluid collection identified within the pelvis, in excess of 14 cm diameter, especially in cul-de-sac but extending LEFT and RIGHT in the pelvis. Scattered septations, wall irregularity, debris, and minimal nodularity seen. Finding is consistent with residual or recurrent abscess. It is difficult to distinguish RIGHT ovary and presence or absence of dilated fallopian tubes due to the large complex nature of the collection. IMPRESSION: Unremarkable uterus and endometrial complex. Large complex multiloculated fluid collection in the pelvis in excess of 14 cm diameter containing multiple thin septations, debris, and irregularity. It is uncertain whether this represents a complicated collection such as a tubo-ovarian abscess or complicated pelvic ascites. Aspirate from prior drainage procedure was negative for  organisms on gram stain and demonstrated no growth on culture. May consider further imaging such as by MR with and without contrast to further characterize, or may consider aspiration. Discussed with Dr. Elonda Husky on 11/08/2020 at 1530 hours. Electronically Signed   By: Lavonia Dana M.D.   On: 11/08/2020 17:51        Assessment and Plan :Rebecca Bell is a 19 y.o. G0P0000 admitted for pelvic pain in the setting of enlarging pelvic fluid collection/TOA. Previously drained. Given significant enlargement in short interval, recommended for admission for IV antibiotics and drainage. Have spoken with IR who will attempt to drain tomorrow if appropriate. Patient verbalizes understanding of and is in agreement with plan.   CT pelvis NPO after midnight Cefoxitin/doxy ordered Coags/STI testing   For OB/GYN issues, please call the [facility] [place] Monday - Friday, 8 am - 5 pm: [phone] All other times: [phone]    K. Arvilla Meres, M.D. Attending [place], [facility] for [employer], [place]

## 2020-11-10 NOTE — Plan of Care (Signed)
  Problem: Health Behavior/Discharge Planning: Goal: Ability to manage health-related needs will improve Outcome: Progressing   Problem: Clinical Measurements: Goal: Ability to maintain clinical measurements within normal limits will improve Outcome: Progressing Goal: Will remain free from infection Outcome: Progressing   

## 2020-11-10 NOTE — Progress Notes (Signed)
Obstetrics and Gynecology Visit Return Patient Evaluation  Appointment Date: 11/10/2020  Primary Care Provider: Patient, No Pcp Per  OBGYN Clinic: [facility]  Chief Complaint: add on visit  History of Present Illness:  Rebecca Bell is a 19 y.o. seen by Dr. Elgie Congo on 11/17 for post hospital follow up after 10/28-11/3 admission for TOA/pelvic fluid collection. She had inpatient 11/2 IR drainage of large amount of fluid (273m) with near resolution of the fluid collection; no drain was left in place and no organisms grew out and only WBC and neutrophils seen.   On her 11/17 post hospital f/u visit, Dr. BElgie Congoput her back on doxy and flagyl and ordered a f/u pelvic u/s given her continued left sided pain. U/s done on 11/22 and he added her for a visit today with me given the u/s findings (see below).  Interval History: She states that the pain was better for about a week after leaving the hospital but it has gradually been getting worse. No fevers, chills, nausea, vomiting. She states she is taking the doxy and flagyl w/o issue.   Review of Systems:  as noted in the History of Present Illness.  Patient Active Problem List   Diagnosis Date Noted  . Thalassemia, unspecified 11/04/2020  . Iron deficiency anemia 11/04/2020  . Thrombocytosis 10/17/2020  . Anemia 10/17/2020  . Tubo-ovarian abscess 10/14/2020   [facility]  [facility]. Current Outpatient Medications  Medication Sig Dispense Refill  . doxycycline (VIBRA-TABS) 100 MG tablet Take 1 tablet (100 mg total) by mouth every 12 (twelve) hours. 24 tablet 0  . ferrous sulfate (FERROUSUL) 325 (65 FE) MG tablet Take 1 tablet (325 mg total) by mouth 2 (two) times daily. 60 tablet 1  . ibuprofen (ADVIL) 600 MG tablet Take 1 tablet (600 mg total) by mouth every 6 (six) hours as needed for cramping. 30 tablet 3  . metroNIDAZOLE (FLAGYL) 500 MG tablet Take  1 tablet (500 mg total) by mouth every 12 (twelve) hours. 24 tablet 0   No current facility-administered medications for this visit.   Allergies: has No Known Allergies.  Physical Exam:  BP 101/68   Pulse (!) 104   Wt 113 lb 8 oz (51.5 kg)   LMP 11/06/2020   BMI 18.89 kg/m  Body mass index is 18.89 kg/m. General appearance: Well nourished, well developed female in no acute distress.  Abdomen: non distended. Mild to moderately ttp in lower belly. No peritoneal s/s Neuro/Psych:  Normal mood and affect.    Radiology: Narrative & Impression  CLINICAL DATA:  Recent tubo-ovarian abscess post drainage on 10/19/2020 and antibiotic therapy, continued pain; LMP 10/30/2020  EXAM: TRANSABDOMINAL AND TRANSVAGINAL ULTRASOUND OF PELVIS  TECHNIQUE: Both transabdominal and transvaginal ultrasound examinations of the pelvis were performed. Transabdominal technique was performed for global imaging of the pelvis including uterus, ovaries, adnexal regions, and pelvic cul-de-sac. It was necessary to proceed with endovaginal exam following the transabdominal exam to visualize the uterus and ovaries.  COMPARISON:  10/14/2020  Correlation: CT pelvis 10/18/2020, 10/19/2020  FINDINGS: Uterus  Measurements: 6.6 x 2.6 x 2.9 cm = volume: 26 mL. Anteverted. Normal morphology without mass  Endometrium  Thickness: 5 mm.  No endometrial fluid or focal abnormality  Right ovary  Not definitely visualized, see below  Left ovary  Measurements: 3.9 x 3.0 x 4.0 cm = volume: 24.1 mL. Questionably visualized, potentially containing a small hemorrhagic corpus luteum 2.6 cm diameter.  Other findings  Large complex multiloculated fluid collection identified within the pelvis, in excess of 14 cm diameter, especially in cul-de-sac but extending LEFT and RIGHT in the pelvis. Scattered septations, wall irregularity, debris, and minimal nodularity seen. Finding is consistent with residual  or recurrent abscess. It is difficult to distinguish RIGHT ovary and presence or absence of dilated fallopian tubes due to the large complex nature of the collection.  IMPRESSION: Unremarkable uterus and endometrial complex.  Large complex multiloculated fluid collection in the pelvis in excess of 14 cm diameter containing multiple thin septations, debris, and irregularity.  It is uncertain whether this represents a complicated collection such as a tubo-ovarian abscess or complicated pelvic ascites.  Aspirate from prior drainage procedure was negative for organisms on gram stain and demonstrated no growth on culture.  May consider further imaging such as by MR with and without contrast to further characterize, or may consider aspiration.  Discussed with Dr. Elonda Husky on 11/08/2020 at 1530 hours.   Electronically Signed   By: Lavonia Dana M.D.   On: 11/08/2020 17:51   Assessment: pt with continued s/s  Plan: Based on her vitals, exam and history, I don't believe she has a TOA but some sort of reaccumulation of fluid. I told her I feel she needs re-evaluation by IR for consideration for another IR drainage and potentially leaving in a drain. She hasn't had anything to eat or drink today. We will try and call outpatient IR and see about anything today and if she is able to get it done today, I feel she can stay on the PO abx and follow up as an outpatient with [place] next week. If not, then I feel she needs admission to the hospital to have it done there and possibly started on IV abx until it can be done.     Update  IR outpatient called and unable to do today. I called IR inpatient Dr. Pascal Lux and they will try and do tomorrow if feasible. He recommends: CT ID pelvis only, coags, no lovenox, npo after MN and to call the IR doc on Thursday  Hospital called and beds available. D/w Dr. Tonita Phoenix MD Attending [facility])

## 2020-11-11 ENCOUNTER — Encounter (HOSPITAL_COMMUNITY): Payer: Self-pay | Admitting: Obstetrics and Gynecology

## 2020-11-11 DIAGNOSIS — R188 Other ascites: Secondary | ICD-10-CM | POA: Diagnosis present

## 2020-11-11 DIAGNOSIS — N83291 Other ovarian cyst, right side: Secondary | ICD-10-CM | POA: Diagnosis present

## 2020-11-11 DIAGNOSIS — N7093 Salpingitis and oophoritis, unspecified: Secondary | ICD-10-CM | POA: Diagnosis present

## 2020-11-11 DIAGNOSIS — K50013 Crohn's disease of small intestine with fistula: Secondary | ICD-10-CM | POA: Diagnosis present

## 2020-11-11 DIAGNOSIS — R700 Elevated erythrocyte sedimentation rate: Secondary | ICD-10-CM | POA: Diagnosis present

## 2020-11-11 DIAGNOSIS — F17200 Nicotine dependence, unspecified, uncomplicated: Secondary | ICD-10-CM | POA: Diagnosis present

## 2020-11-11 DIAGNOSIS — Z20822 Contact with and (suspected) exposure to covid-19: Secondary | ICD-10-CM | POA: Diagnosis present

## 2020-11-11 DIAGNOSIS — K651 Peritoneal abscess: Secondary | ICD-10-CM | POA: Diagnosis present

## 2020-11-11 DIAGNOSIS — R630 Anorexia: Secondary | ICD-10-CM | POA: Diagnosis present

## 2020-11-11 DIAGNOSIS — Z79899 Other long term (current) drug therapy: Secondary | ICD-10-CM | POA: Diagnosis not present

## 2020-11-11 DIAGNOSIS — N83209 Unspecified ovarian cyst, unspecified side: Secondary | ICD-10-CM | POA: Diagnosis present

## 2020-11-11 LAB — C-REACTIVE PROTEIN: CRP: 6.9 mg/dL — ABNORMAL HIGH (ref <1.0)

## 2020-11-11 MED ORDER — CIPROFLOXACIN IN D5W 400 MG/200ML IV SOLN
400.0000 mg | Freq: Two times a day (BID) | INTRAVENOUS | Status: DC
  Administered 2020-11-11 – 2020-11-16 (×10): 400 mg via INTRAVENOUS
  Filled 2020-11-11 (×12): qty 200

## 2020-11-11 MED ORDER — METRONIDAZOLE 500 MG PO TABS
500.0000 mg | ORAL_TABLET | Freq: Three times a day (TID) | ORAL | Status: DC
  Administered 2020-11-11 – 2020-11-16 (×15): 500 mg via ORAL
  Filled 2020-11-11 (×15): qty 1

## 2020-11-11 NOTE — Consult Note (Signed)
Referring Provider: OBGYN Primary Care Physician:  Patient, No Pcp Per Primary Gastroenterologist: Althia Forts  Reason for Consultation: Possible Crohn's disease  HPI: Rebecca Bell is a 19 y.o. female admitted to the hospital for further management of tubo-ovarian abscess.  She was admitted to the hospital few weeks ago for tubo-ovarian abscess.  Underwent CT-guided pelvic abscess drain placement on October 19, 2020.  Patient did well for around 1 week after discharge but then again started having abdominal pain.  Pelvic ultrasound on November 08, 2020 showed large complex multiloculated fluid collection in the pelvis of around 14 cm in the diameter.  Was seen by OB/GYN in the office yesterday.  She was subsequently admitted to the hospital.  CT pelvis with contrast yesterday showed complex fistulous network in the pelvis leading to abscess.  Finding concerning for Crohn's disease.  Also showed enlarging pelvic fluid collection.  GI is consulted for further evaluation.  Patient seen and examined at bedside. According to her, she has been having abdominal pain and pelvic pain for more than 1 year now. Had a negative work-up including normal colonoscopy in November 2020 in [place]. She has been having intermittent diarrhea for last 1 year. Denies any blood in the stool or black stool. Denies any vomiting. Complaining of nausea. Past Medical History:  Diagnosis Date  . Abdominal pain   . Ovarian cyst     Past Surgical History:  Procedure Laterality Date  . TIBIA FRACTURE SURGERY      Prior to Admission medications   Medication Sig Start Date End Date Taking? Authorizing Provider  doxycycline (VIBRA-TABS) 100 MG tablet Take 1 tablet (100 mg total) by mouth every 12 (twelve) hours. 11/03/20  Yes Griffin Basil, MD  ferrous sulfate (FERROUSUL) 325 (65 FE) MG tablet Take 1 tablet (325 mg total) by mouth 2 (two) times daily. 10/20/20  Yes Constant, Peggy, MD  ibuprofen (ADVIL) 600 MG tablet Take 1  tablet (600 mg total) by mouth every 6 (six) hours as needed for cramping. 10/20/20  Yes Constant, Peggy, MD  metroNIDAZOLE (FLAGYL) 500 MG tablet Take 1 tablet (500 mg total) by mouth every 12 (twelve) hours. 11/03/20  Yes Griffin Basil, MD  oxyCODONE-acetaminophen (PERCOCET/ROXICET) 5-325 MG tablet Take 1 tablet by mouth every 4 (four) hours as needed (moderate to severe pain (when tolerating fluids)). 10/20/20  Yes Constant, Peggy, MD    Scheduled Meds: . docusate sodium  100 mg Oral BID  . metroNIDAZOLE  500 mg Oral Q8H  . sodium chloride flush  3 mL Intravenous Q12H   Continuous Infusions: . sodium chloride    . ciprofloxacin     PRN Meds:.sodium chloride, acetaminophen, morphine injection, ondansetron **OR** ondansetron (ZOFRAN) IV, oxyCODONE, polyethylene glycol, simethicone, sodium chloride flush  Allergies as of 11/10/2020  . (No Known Allergies)    History reviewed. No pertinent family history.  Social History   Socioeconomic History  . Marital status: Single    Spouse name: Not on file  . Number of children: Not on file  . Years of education: Not on file  . Highest education level: Not on file  Occupational History  . Occupation: Scientist, water quality  Tobacco Use  . Smoking status: Current Every Day Smoker  . Smokeless tobacco: Never Used  Substance and Sexual Activity  . Alcohol use: Yes    Comment: occ  . Drug use: Not on file    Comment: last time 1 month ago  . Sexual activity: Not on file  Other Topics Concern  .  Not on file  Social History Narrative  . Not on file   Social Determinants of Health   Financial Resource Strain:   . Difficulty of Paying Living Expenses: Not on file  Food Insecurity: No Food Insecurity  . Worried About Charity fundraiser in the Last Year: Never true  . Ran Out of Food in the Last Year: Never true  Transportation Needs: No Transportation Needs  . Lack of Transportation (Medical): No  . Lack of Transportation (Non-Medical): No   Physical Activity:   . Days of Exercise per Week: Not on file  . Minutes of Exercise per Session: Not on file  Stress:   . Feeling of Stress : Not on file  Social Connections:   . Frequency of Communication with Friends and Family: Not on file  . Frequency of Social Gatherings with Friends and Family: Not on file  . Attends Religious Services: Not on file  . Active Member of Clubs or Organizations: Not on file  . Attends Archivist Meetings: Not on file  . Marital Status: Not on file  Intimate Partner Violence: Not At Risk  . Fear of Current or Ex-Partner: No  . Emotionally Abused: No  . Physically Abused: No  . Sexually Abused: No    Review of Systems: 12 point review of system is done which is negative except as mentioned in HPI  Physical Exam: Vital signs: Vitals:   11/11/20 0350 11/11/20 1138  BP: 113/71 99/77  Pulse: (!) 102 98  Resp: 16 18  Temp: 99.2 F (37.3 C)   SpO2: 100% 100%   Last BM Date: 11/10/20 Physical Exam Vitals and nursing note reviewed.  Constitutional:      General: She is not in acute distress.    Appearance: Normal appearance.  HENT:     Head: Normocephalic and atraumatic.     Nose: Nose normal. No congestion.     Mouth/Throat:     Mouth: Mucous membranes are moist.     Pharynx: No oropharyngeal exudate.  Eyes:     Extraocular Movements: Extraocular movements intact.  Cardiovascular:     Rate and Rhythm: Normal rate and regular rhythm.     Heart sounds: No murmur heard.   Pulmonary:     Effort: Pulmonary effort is normal. No respiratory distress.     Breath sounds: Normal breath sounds.  Abdominal:     General: Abdomen is flat. Bowel sounds are normal. There is no distension.     Tenderness: There is no abdominal tenderness. There is no guarding.  Musculoskeletal:        General: Normal range of motion.     Cervical back: Normal range of motion.     Right lower leg: No edema.     Left lower leg: No edema.  Skin:     General: Skin is warm.     Coloration: Skin is not jaundiced.  Neurological:     Mental Status: She is alert and oriented to person, place, and time.  Psychiatric:        Mood and Affect: Mood normal.        Thought Content: Thought content normal.        Judgment: Judgment normal.     GI:  Lab Results: Recent Labs    11/10/20 1453  WBC 12.3*  HGB 10.0*  HCT 34.2*  PLT 699*   BMET No results for input(s): NA, K, CL, CO2, GLUCOSE, BUN, CREATININE, CALCIUM in the last 72  hours. LFT No results for input(s): PROT, ALBUMIN, AST, ALT, ALKPHOS, BILITOT, BILIDIR, IBILI in the last 72 hours. PT/INR Recent Labs    11/10/20 1453  LABPROT 14.6  INR 1.2     Studies/Results: CT PELVIS W CONTRAST  Result Date: 11/10/2020 CLINICAL DATA:  Tubo-ovarian abscess EXAM: CT PELVIS WITH CONTRAST TECHNIQUE: Multidetector CT imaging of the pelvis was performed using the standard protocol following the bolus administration of intravenous contrast. CONTRAST:  132m OMNIPAQUE IOHEXOL 300 MG/ML  SOLN COMPARISON:  10/18/2020 FINDINGS: Urinary Tract: Urinary bladder is under distended limiting assessment. Less distended than on the prior exam. No distal ureteral dilation. Bowel: Diffuse mural stratification and irregular thickening of small bowel loops in the pelvis. Complex fistulous network involving the distal ileum with tract extending from the distal ileum into the LEFT adnexa where there is a peripherally enhancing fluid containing structure that is similar to the prior study. This area measures approximately 3.2 x 2.4 cm (image 19 of series 5. Displacing bowel loops in the pelvis is a 12 x 6.8 cm area of fluid density with peripheral septation that has enlarged since the prior study where it measured 10 x 6 cm. Pseudo sacculation along the distal ileum is present with transmural fistulae and overt fistulization to LEFT adnexa and with presumed Fischel ization also to the sigmoid colon, LEFT adnexal  component best seen on image 67 of series 7. 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 Fistulization of LEFT adnexa can be followed on images 94-103 as well into the LEFT adnexa. Extensive stranding about the pelvis with similar appearance to prior imaging. No free air to exhibited in the lower abdomen/upper pelvis. Potential fistulization as well to the appendix which is at the same level of potentials fistulization to the sigmoid colon. Upstream bowel loops are dilated with similar pattern to previous imaging. Vascular/Lymphatic: Patent pelvic vasculature grossly. Venous structures not well assessed. No pelvic lymphadenopathy. Reproductive: Uterus displaced from fluid collection in the RIGHT pelvis. Other: Mesenteric stranding in the low abdomen. This finding is similar to the previous imaging study. Musculoskeletal: Subtle sclerosis in the subchondral region of the bilateral femoral heads raising the question of early avascular necrosis. IMPRESSION: 1. Complex fistulous network in the pelvis leading to abscess in the LEFT adnexa in the setting of presumed inflammatory bowel disease, most suggestive of Crohn's disease. Correlate with patient history for repeated episodes of abdominal pain. 2. Enlarging more simple appearing fluid in the pelvis in the RIGHT pelvis displacing structures from RIGHT to LEFT likely a peritoneal inclusion cyst in the setting of repeated inflammation based on the appearance of small bowel loops. This could also represent a hydrosalpinx. Correlate with fluid sampling which was performed on October 19, 2020 possibility of interval super infection is considered though the appearance while enlarged is otherwise similar to previous imaging. The 3. Upstream bowel loops are dilated likely related to partial obstruction but unchanged from previous exam. 4. Subtle sclerosis in the subchondral region of the bilateral femoral heads raising the question  of early avascular necrosis. These results will be called to the ordering clinician or representative by the Radiologist Assistant, and communication documented in the PACS or [employer] Electronically Signed   By: GZetta BillsM.D.   On: 11/10/2020 17:56    Impression/Plan: -Recurrent tubo-ovarian abscess with large pelvic fluid collection. CT scan showed fistulous network in the pelvis could be from underlying Crohn's disease. According to patient, she  had a negative work-up for Crohn's disease last year in [place] including normal colonoscopy.  Recommendations -------------------------- -Interventional radiology evaluation for IR guided drain placement -Recommend treatment of pelvic abscess with ciprofloxacin and Flagyl for extended period of time may be for around 1 month. -Would not pursue colonoscopy at this point given large pelvic fluid collection as well as presence of abscess. -Check CRP and IBD panel. -GI will follow -Outpatient colonoscopy once acute issues with abscess have resolved    LOS: 1 day   Otis Brace  MD, FACP 11/11/2020, 12:00 PM  Contact #  [phone]

## 2020-11-11 NOTE — Progress Notes (Addendum)
[facility] OB/[facility]N Attending Note  Subjective:  Patient with worsening lower abdominal pain, more in midline. 10/10. Has been NPO since last night for possible IR drainage today.  Admitted on 11/10/2020 for TOA (tubo-ovarian abscess).    Objective:  Blood pressure 99/77, pulse 98, temperature 99.2 F (37.3 C), temperature source Oral, resp. rate 18, height 5' 5"  (1.651 m), weight 51.4 kg, last menstrual period 11/06/2020, SpO2 100 %. Gen: NAD HENT: Normocephalic, atraumatic Lungs: Normal respiratory effort Heart: Regular rate noted Abdomen: soft, marked tenderness in lower abdomen diffusely to palpation, no rebound or guarding Cervix: Deferred Ext: 2+ DTRs, no edema, no cyanosis, negative Homan's sign  Studies: CBC Latest Ref Rng & Units 11/10/2020 11/04/2020 11/03/2020  WBC 4.0 - 10.5 K/uL 12.3(H) 12.5(H) 12.7(H)  Hemoglobin 12.0 - 15.0 g/dL 10.0(L) 10.5(L) 11.6  Hematocrit 36 - 46 % 34.2(L) 35.4(L) 38.7  Platelets 150 - 400 K/uL 699(H) 734(H) 748(H)   CMP Latest Ref Rng & Units 10/14/2020 10/13/2020  Glucose 70 - 99 mg/dL 92 102(H)  BUN 6 - 20 mg/dL 10 16  Creatinine 0.44 - 1.00 mg/dL 0.72 0.86  Sodium 135 - 145 mmol/L 134(L) 136  Potassium 3.5 - 5.1 mmol/L 3.3(L) 3.3(L)  Chloride 98 - 111 mmol/L 98 100  CO2 22 - 32 mmol/L 26 25  Calcium 8.9 - 10.3 mg/dL 8.6(L) 9.1  Total Protein 6.5 - 8.1 g/dL - 8.8(H)  Total Bilirubin 0.3 - 1.2 mg/dL - 0.2(L)  Alkaline Phos 38 - 126 U/L - 65  AST 15 - 41 U/L - 16  ALT 0 - 44 U/L - 11   CT PELVIS W CONTRAST  Result Date: 11/10/2020 CLINICAL DATA:  Tubo-ovarian abscess EXAM: CT PELVIS WITH CONTRAST TECHNIQUE: Multidetector CT imaging of the pelvis was performed using the standard protocol following the bolus administration of intravenous contrast. CONTRAST:  133m OMNIPAQUE IOHEXOL 300 MG/ML  SOLN COMPARISON:  10/18/2020 FINDINGS: Urinary Tract: Urinary bladder is under distended limiting assessment. Less distended than on the  prior exam. No distal ureteral dilation. Bowel: Diffuse mural stratification and irregular thickening of small bowel loops in the pelvis. Complex fistulous network involving the distal ileum with tract extending from the distal ileum into the LEFT adnexa where there is a peripherally enhancing fluid containing structure that is similar to the prior study. This area measures approximately 3.2 x 2.4 cm (image 19 of series 5. Displacing bowel loops in the pelvis is a 12 x 6.8 cm area of fluid density with peripheral septation that has enlarged since the prior study where it measured 10 x 6 cm. Pseudo sacculation along the distal ileum is present with transmural fistulae and overt fistulization to LEFT adnexa and with presumed Fischel ization also to the sigmoid colon, LEFT adnexal component best seen on image 67 of series 7. 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 Fistulization of LEFT adnexa can be followed on images 94-103 as well into the LEFT adnexa. Extensive stranding about the pelvis with similar appearance to prior imaging. No free air to exhibited in the lower abdomen/upper pelvis. Potential fistulization as well to the appendix which is at the same level of potentials fistulization to the sigmoid colon. Upstream bowel loops are dilated with similar pattern to previous imaging. Vascular/Lymphatic: Patent pelvic vasculature grossly. Venous structures not well assessed. No pelvic lymphadenopathy. Reproductive: Uterus displaced from fluid collection in the RIGHT pelvis. Other: Mesenteric stranding in the low abdomen. This finding  is similar to the previous imaging study. Musculoskeletal: Subtle sclerosis in the subchondral region of the bilateral femoral heads raising the question of early avascular necrosis. IMPRESSION: 1. Complex fistulous network in the pelvis leading to abscess in the LEFT adnexa in the setting of presumed inflammatory bowel disease, most  suggestive of Crohn's disease. Correlate with patient history for repeated episodes of abdominal pain. 2. Enlarging more simple appearing fluid in the pelvis in the RIGHT pelvis displacing structures from RIGHT to LEFT likely a peritoneal inclusion cyst in the setting of repeated inflammation based on the appearance of small bowel loops. This could also represent a hydrosalpinx. Correlate with fluid sampling which was performed on October 19, 2020 possibility of interval super infection is considered though the appearance while enlarged is otherwise similar to previous imaging. The 3. Upstream bowel loops are dilated likely related to partial obstruction but unchanged from previous exam. 4. Subtle sclerosis in the subchondral region of the bilateral femoral heads raising the question of early avascular necrosis. These results will be called to the ordering clinician or representative by the Radiologist Assistant, and communication documented in the PACS or [employer]. Electronically Signed   By: Zetta Bills M.D.   On: 11/10/2020 17:56   CT PELVIS W CONTRAST  Result Date: 10/18/2020 CLINICAL DATA:  Tubo-ovarian abscess EXAM: CT PELVIS WITH CONTRAST TECHNIQUE: Multidetector CT imaging of the pelvis was performed using the standard protocol following the bolus administration of intravenous contrast. CONTRAST:  157m OMNIPAQUE IOHEXOL 300 MG/ML  SOLN COMPARISON:  MR pelvis dated 10/17/2020. Ultrasound pelvis dated 10/14/2020. FINDINGS: Urinary Tract:  Bladder is within normal limits. Bowel: Long segment wall thickening involving the distal/terminal ileum (series 3/image 15), underdistended but abnormal. Vascular/Lymphatic: No evidence of aneurysm. No suspicious pelvic lymphadenopathy. Reproductive:  Uterus is within normal limits. Right ovary is likely within normal limits (series 3/image 20). 3.1 x 3.7 cm irregular enhancing lesion in the left ovary (series 3/image 20; sagittal image 92). Other: Small to  moderate pelvic ascites, mildly masslike. Associated fluid/ascites along the small bowel mesentery (series 3/images 11 and 14). Musculoskeletal: Visualized osseous structures are within normal limits. IMPRESSION: 3.7 cm irregular enhancing lesion in the left ovary, favoring a tubo-ovarian abscess on recent MRI. Long segment wall thickening involving the distal/terminal ileum, abnormal. This appearance may be reactive given the left adnexal process, but infectious/inflammatory ileitis is not excluded. At a minimum, GI follow-up after resolution of the pelvic inflammatory process is suggested. Small to moderate pelvic ascites, mildly masslike. Associated fluid/ascites along the small bowel mesentery. Electronically Signed   By: SJulian HyM.D.   On: 10/18/2020 13:35   MR PELVIS W WO CONTRAST  Result Date: 10/18/2020 CLINICAL DATA:  Pelvic mass on ultrasound EXAM: MRI PELVIS WITHOUT AND WITH CONTRAST TECHNIQUE: Multiplanar multisequence MR imaging of the pelvis was performed both before and after administration of intravenous contrast. CONTRAST:  547mGADAVIST GADOBUTROL 1 MMOL/ML IV SOLN COMPARISON:  Pelvic ultrasound dated 10/06/2020 FINDINGS: Urinary Tract:  Bladder is within normal limits. Bowel: Long segment wall thickening involving the distal/terminal ileum (series 5/images 10 and 13), nonspecific. Vascular/Lymphatic: No evidence of aneurysm. No suspicious pelvic lymphadenopathy. Reproductive:  Uterus is within normal limits. Right ovary is within normal limits. 3.8 x 3.8 x 2.9 cm peripherally enhancing left ovarian mass (series 3/image 16). No significant intrinsic T1 hyperintensity on precontrast imaging. As such, this appearance favors a tubo-ovarian abscess over a hemorrhagic corpus luteum. Other: Moderate pelvic ascites, partially loculated with mass effect in  the dependent pelvis. Musculoskeletal: No focal osseous lesions. IMPRESSION: 3.8 cm left ovarian mass, favoring a small tubo-ovarian  abscess. Long segment wall thickening involving the distal/terminal ileum, nonspecific and likely reactive given the left adnexal inflammatory process favored above. Associated moderate pelvic ascites, partially loculated with mass-effect in the dependent pelvis. Electronically Signed   By: Julian Hy M.D.   On: 10/18/2020 07:27   CT ASPIRATION  Result Date: 10/19/2020 CLINICAL DATA:  Pelvic fluid and possible left tubo-ovarian abscess. EXAM: CT GUIDED ASPIRATION OF PELVIC FLUID COLLECTION ANESTHESIA/SEDATION: 2.0 mg IV Versed 75 mcg IV Fentanyl Total Moderate Sedation Time:  19 minutes The patient's level of consciousness and physiologic status were continuously monitored during the procedure by Radiology nursing. PROCEDURE: The procedure, risks, benefits, and alternatives were explained to the patient. Questions regarding the procedure were encouraged and answered. The patient understands and consents to the procedure. A time out was performed prior to initiating the procedure. CT of the pelvis was performed in a prone position. The left gluteal region was prepped with chlorhexidine in a sterile fashion, and a sterile drape was applied covering the operative field. A sterile gown and sterile gloves were used for the procedure. Local anesthesia was provided with 1% Lidocaine. From a posterior left transgluteal approach, a 5 [place] Yueh centesis catheter was advanced over a 19 gauge needle under CT guidance to the level of a posterior pelvic fluid collection. After confirming catheter position, aspiration was performed initially with a syringe for a sample which was sent for fluid cultures followed by vacuum bottle evacuation. The catheter was removed and additional CT performed. COMPLICATIONS: None FINDINGS: Aspiration at the level of the posterior pelvic fluid collection located in the cul-de-sac yielded clear, yellow fluid. A sample was sent for culture analysis. A total of 280 mL of fluid was able to  be removed resulting in near complete decompression of the fluid collection by CT. There is no safe anterior or posterior percutaneous window to the level of the left ovary. IMPRESSION: CT-guided aspiration of pelvic fluid collection. A sample of clear, yellow fluid was sent for culture analysis. A total of 280 mL of fluid was able to be removed resulting in near complete decompression of the fluid collection by CT. Electronically Signed   By: Aletta Edouard M.D.   On: 10/19/2020 13:40   US PELVIC DOPPLER (TORSION R/O OR MASS ARTERIAL FLOW)  Result Date: 10/14/2020 CLINICAL DATA:  Severe pelvic pain for 1 week. Last menstrual period 09/23/2020. EXAM: TRANSABDOMINAL ULTRASOUND OF PELVIS DOPPLER ULTRASOUND OF OVARIES TECHNIQUE: Transabdominal ultrasound examination of the pelvis was performed including evaluation of the uterus, ovaries, adnexal regions, and pelvic cul-de-sac. Color and duplex Doppler ultrasound was utilized to evaluate blood flow to the ovaries. COMPARISON:  None. FINDINGS: Measurements: 6.3 x 2.4 x 3.4 cm = volume: 27 mL. No fibroids or other mass visualized. Endometrium Thickness: 3 mm.  No focal abnormality visualized. Right ovary Measurements: 5.9 x 4.1 x 4.3 cm = volume: 54 mL. A right adnexal mass has multiple thin septations and measures 10.0 x 6.1 x 7.2 cm. Left ovary Measurements: 5.0 x 4.7 x 5.3 cm = volume: 65 mL. The left ovary has a heterogeneous appearance. Pulsed Doppler evaluation demonstrates normal low-resistance arterial and venous waveforms in both ovaries. Other findings: A heterogeneous soft tissue mass superior to the uterus measures 8.0 x 4.8 x 8.6 cm and demonstrates internal blood flow. Small volume free fluid is seen in the pelvis. IMPRESSION: 1. Enlarged and heterogeneous  appearing left ovary may reflect infection or torsion. 2. Large cystic mass in the vicinity of the right adnexa may reflect hydrosalpinx, pyosalpinx, or an adnexal cyst with multiple thin septations.  3. Heterogeneous soft tissue mass superior to the uterus is nonspecific and may reflect a tubo-ovarian abscess. These results were called by telephone at the time of interpretation on 10/14/2020 at 2:12 pm to the patient's provider, who verbally acknowledged these results. Electronically Signed   By: Zerita Boers M.D.   On: 10/14/2020 14:12   US PELVIC COMPLETE WITH TRANSVAGINAL  Result Date: 11/08/2020 CLINICAL DATA:  Recent tubo-ovarian abscess post drainage on 10/19/2020 and antibiotic therapy, continued pain; LMP 10/30/2020 EXAM: TRANSABDOMINAL AND TRANSVAGINAL ULTRASOUND OF PELVIS TECHNIQUE: Both transabdominal and transvaginal ultrasound examinations of the pelvis were performed. Transabdominal technique was performed for global imaging of the pelvis including uterus, ovaries, adnexal regions, and pelvic cul-de-sac. It was necessary to proceed with endovaginal exam following the transabdominal exam to visualize the uterus and ovaries. COMPARISON:  10/14/2020 Correlation: CT pelvis 10/18/2020, 10/19/2020 FINDINGS: Uterus Measurements: 6.6 x 2.6 x 2.9 cm = volume: 26 mL. Anteverted. Normal morphology without mass Endometrium Thickness: 5 mm.  No endometrial fluid or focal abnormality Right ovary Not definitely visualized, see below Left ovary Measurements: 3.9 x 3.0 x 4.0 cm = volume: 24.1 mL. Questionably visualized, potentially containing a small hemorrhagic corpus luteum 2.6 cm diameter. Other findings Large complex multiloculated fluid collection identified within the pelvis, in excess of 14 cm diameter, especially in cul-de-sac but extending LEFT and RIGHT in the pelvis. Scattered septations, wall irregularity, debris, and minimal nodularity seen. Finding is consistent with residual or recurrent abscess. It is difficult to distinguish RIGHT ovary and presence or absence of dilated fallopian tubes due to the large complex nature of the collection. IMPRESSION: Unremarkable uterus and endometrial  complex. Large complex multiloculated fluid collection in the pelvis in excess of 14 cm diameter containing multiple thin septations, debris, and irregularity. It is uncertain whether this represents a complicated collection such as a tubo-ovarian abscess or complicated pelvic ascites. Aspirate from prior drainage procedure was negative for organisms on gram stain and demonstrated no growth on culture. May consider further imaging such as by MR with and without contrast to further characterize, or may consider aspiration. Discussed with Dr. Elonda Husky on 11/08/2020 at 1530 hours. Electronically Signed   By: Lavonia Dana M.D.   On: 11/08/2020 17:51    Assessment & Plan:  19 y.o. 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 Patient has been NPO since last night - GI consulted, talked to Dr. Alessandra Bevels, recommended switching antibiotics to Cipro/Flagyl given concern about bowel involvement and Crohn's. Formal consult to follow. - General Surgery  consulted, they will evaluate imaging and get back to [place] but feel there is no need for urgent surgical intervention. Formal consult to follow, - Analgesia as needed - Continue close observation. Plan wasdiscussed with patient and her nursing team this morning.   Verita Schneiders, MD, [place] for [employer], [place]

## 2020-11-11 NOTE — Consult Note (Signed)
Chief Complaint: Patient was seen in consultation today for intra-abdominal fluid collection  Referring Physician(s): Dr. Vivien Rota  Supervising Physician: Arne Cleveland  Patient Status: [facility] - In-pt  History of Present Illness: Rebecca Bell is a 19 y.o. female with past medical history of abdominal pain, ovarian cysts presents with recurrence of pelvic pain related to worsening TOA/pelvic fluid collection.  Patient known to IR from recent aspiration of pelvic fluid collection 11/2 with 280 mL fluid removed and resolve of collection.  She reports a few days of relief before slow, progressive return of her pain.   CT overnight showed: 1. Complex fistulous network in the pelvis leading to abscess in the LEFT adnexa in the setting of presumed inflammatory bowel disease, most suggestive of Crohn's disease. Correlate with patient history for repeated episodes of abdominal pain. 2. Enlarging more simple appearing fluid in the pelvis in the RIGHT pelvis displacing structures from RIGHT to LEFT likely a peritoneal inclusion cyst in the setting of repeated inflammation based on the appearance of small bowel loops. This could also represent a hydrosalpinx. Correlate with fluid sampling which was performed on October 19, 2020 possibility of interval super infection is considered though the appearance while enlarged is otherwise similar to previous imaging. The 3. Upstream bowel loops are dilated likely related to partial obstruction but unchanged from previous exam. 4. Subtle sclerosis in the subchondral region of the bilateral femoral heads raising the question of early avascular necrosis.  Patient assessed at bedside alongside OBGYN and GI.   Patient reports prior work up in [place] approximately 1 year ago for similar abdominal pain and fluid collections, however no collections were drainable at that time.  She reports s/p EGD and colonscopy for possible Crohn's which returned  negative.  She is not aware of any working diagnosis and has had intermittent symptoms for the past 1 year since this evaluation.  She complains of abdominal pain, worse on the right. She is agreeable to aspiration/drainage.  She has been NPO today.    Past Medical History:  Diagnosis Date  . Abdominal pain   . Ovarian cyst     Past Surgical History:  Procedure Laterality Date  . TIBIA FRACTURE SURGERY      Allergies: Patient has no known allergies.  Medications: Prior to Admission medications   Medication Sig Start Date End Date Taking? Authorizing Provider  doxycycline (VIBRA-TABS) 100 MG tablet Take 1 tablet (100 mg total) by mouth every 12 (twelve) hours. 11/03/20  Yes Griffin Basil, MD  ferrous sulfate (FERROUSUL) 325 (65 FE) MG tablet Take 1 tablet (325 mg total) by mouth 2 (two) times daily. 10/20/20  Yes Constant, Peggy, MD  ibuprofen (ADVIL) 600 MG tablet Take 1 tablet (600 mg total) by mouth every 6 (six) hours as needed for cramping. 10/20/20  Yes Constant, Peggy, MD  metroNIDAZOLE (FLAGYL) 500 MG tablet Take 1 tablet (500 mg total) by mouth every 12 (twelve) hours. 11/03/20  Yes Griffin Basil, MD  oxyCODONE-acetaminophen (PERCOCET/ROXICET) 5-325 MG tablet Take 1 tablet by mouth every 4 (four) hours as needed (moderate to severe pain (when tolerating fluids)). 10/20/20  Yes Constant, Vickii Chafe, MD     History reviewed. No pertinent family history.  Social History   Socioeconomic History  . Marital status: Single    Spouse name: Not on file  . Number of children: Not on file  . Years of education: Not on file  . Highest education level: Not on file  Occupational History  .  Occupation: Scientist, water quality  Tobacco Use  . Smoking status: Current Every Day Smoker  . Smokeless tobacco: Never Used  Substance and Sexual Activity  . Alcohol use: Yes    Comment: occ  . Drug use: Not on file    Comment: last time 1 month ago  . Sexual activity: Not on file  Other Topics Concern    . Not on file  Social History Narrative  . Not on file   Social Determinants of Health   Financial Resource Strain:   . Difficulty of Paying Living Expenses: Not on file  Food Insecurity: No Food Insecurity  . Worried About Charity fundraiser in the Last Year: Never true  . Ran Out of Food in the Last Year: Never true  Transportation Needs: No Transportation Needs  . Lack of Transportation (Medical): No  . Lack of Transportation (Non-Medical): No  Physical Activity:   . Days of Exercise per Week: Not on file  . Minutes of Exercise per Session: Not on file  Stress:   . Feeling of Stress : Not on file  Social Connections:   . Frequency of Communication with Friends and Family: Not on file  . Frequency of Social Gatherings with Friends and Family: Not on file  . Attends Religious Services: Not on file  . Active Member of Clubs or Organizations: Not on file  . Attends Archivist Meetings: Not on file  . Marital Status: Not on file     Review of Systems: A 12 point ROS discussed and pertinent positives are indicated in the HPI above.  All other systems are negative.  Review of Systems  Constitutional: Negative for fatigue and fever.  Respiratory: Negative for cough and shortness of breath.   Cardiovascular: Negative for chest pain.  Gastrointestinal: Positive for abdominal pain. Negative for diarrhea, nausea and vomiting.  Genitourinary: Negative for dysuria.  Musculoskeletal: Negative for back pain.  Psychiatric/Behavioral: Negative for behavioral problems and confusion.    Vital Signs: BP 99/77 (BP Location: Right Arm)   Pulse 98   Temp 99.2 F (37.3 C) (Oral)   Resp 18   Ht 5' 5"  (1.651 m)   Wt 113 lb 5.1 oz (51.4 kg)   LMP 11/06/2020   SpO2 100%   BMI 18.86 kg/m   Physical Exam Vitals and nursing note reviewed.  Constitutional:      General: She is not in acute distress.    Appearance: Normal appearance. She is not ill-appearing.  HENT:      Mouth/Throat:     Mouth: Mucous membranes are moist.     Pharynx: Oropharynx is clear.  Cardiovascular:     Rate and Rhythm: Normal rate and regular rhythm.  Pulmonary:     Effort: Pulmonary effort is normal. No respiratory distress.     Breath sounds: Normal breath sounds.  Abdominal:     General: There is no distension.     Palpations: Abdomen is soft.     Tenderness: There is abdominal tenderness (R>L).  Neurological:     Mental Status: She is alert.      MD Evaluation Airway: WNL Heart: WNL Abdomen: WNL Chest/ Lungs: WNL ASA  Classification: 3 Mallampati/Airway Score: One   Imaging: CT PELVIS W CONTRAST  Result Date: 11/10/2020 CLINICAL DATA:  Tubo-ovarian abscess EXAM: CT PELVIS WITH CONTRAST TECHNIQUE: Multidetector CT imaging of the pelvis was performed using the standard protocol following the bolus administration of intravenous contrast. CONTRAST:  134m OMNIPAQUE IOHEXOL 300 MG/ML  SOLN COMPARISON:  10/18/2020 FINDINGS: Urinary Tract: Urinary bladder is under distended limiting assessment. Less distended than on the prior exam. No distal ureteral dilation. Bowel: Diffuse mural stratification and irregular thickening of small bowel loops in the pelvis. Complex fistulous network involving the distal ileum with tract extending from the distal ileum into the LEFT adnexa where there is a peripherally enhancing fluid containing structure that is similar to the prior study. This area measures approximately 3.2 x 2.4 cm (image 19 of series 5. Displacing bowel loops in the pelvis is a 12 x 6.8 cm area of fluid density with peripheral septation that has enlarged since the prior study where it measured 10 x 6 cm. Pseudo sacculation along the distal ileum is present with transmural fistulae and overt fistulization to LEFT adnexa and with presumed Fischel ization also to the sigmoid colon, LEFT adnexal component best seen on image 67 of series 7. 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 Fistulization of LEFT adnexa can be followed on images 94-103 as well into the LEFT adnexa. Extensive stranding about the pelvis with similar appearance to prior imaging. No free air to exhibited in the lower abdomen/upper pelvis. Potential fistulization as well to the appendix which is at the same level of potentials fistulization to the sigmoid colon. Upstream bowel loops are dilated with similar pattern to previous imaging. Vascular/Lymphatic: Patent pelvic vasculature grossly. Venous structures not well assessed. No pelvic lymphadenopathy. Reproductive: Uterus displaced from fluid collection in the RIGHT pelvis. Other: Mesenteric stranding in the low abdomen. This finding is similar to the previous imaging study. Musculoskeletal: Subtle sclerosis in the subchondral region of the bilateral femoral heads raising the question of early avascular necrosis. IMPRESSION: 1. Complex fistulous network in the pelvis leading to abscess in the LEFT adnexa in the setting of presumed inflammatory bowel disease, most suggestive of Crohn's disease. Correlate with patient history for repeated episodes of abdominal pain. 2. Enlarging more simple appearing fluid in the pelvis in the RIGHT pelvis displacing structures from RIGHT to LEFT likely a peritoneal inclusion cyst in the setting of repeated inflammation based on the appearance of small bowel loops. This could also represent a hydrosalpinx. Correlate with fluid sampling which was performed on October 19, 2020 possibility of interval super infection is considered though the appearance while enlarged is otherwise similar to previous imaging. The 3. Upstream bowel loops are dilated likely related to partial obstruction but unchanged from previous exam. 4. Subtle sclerosis in the subchondral region of the bilateral femoral heads raising the question of early avascular necrosis. These results will be called to the ordering clinician or  representative by the Radiologist Assistant, and communication documented in the PACS or [employer]. Electronically Signed   By: Zetta Bills M.D.   On: 11/10/2020 17:56   CT PELVIS W CONTRAST  Result Date: 10/18/2020 CLINICAL DATA:  Tubo-ovarian abscess EXAM: CT PELVIS WITH CONTRAST TECHNIQUE: Multidetector CT imaging of the pelvis was performed using the standard protocol following the bolus administration of intravenous contrast. CONTRAST:  136m OMNIPAQUE IOHEXOL 300 MG/ML  SOLN COMPARISON:  MR pelvis dated 10/17/2020. Ultrasound pelvis dated 10/14/2020. FINDINGS: Urinary Tract:  Bladder is within normal limits. Bowel: Long segment wall thickening involving the distal/terminal ileum (series 3/image 15), underdistended but abnormal. Vascular/Lymphatic: No evidence of aneurysm. No suspicious pelvic lymphadenopathy. Reproductive:  Uterus is within normal limits. Right ovary is likely within normal limits (series 3/image 20). 3.1 x 3.7 cm irregular  enhancing lesion in the left ovary (series 3/image 20; sagittal image 92). Other: Small to moderate pelvic ascites, mildly masslike. Associated fluid/ascites along the small bowel mesentery (series 3/images 11 and 14). Musculoskeletal: Visualized osseous structures are within normal limits. IMPRESSION: 3.7 cm irregular enhancing lesion in the left ovary, favoring a tubo-ovarian abscess on recent MRI. Long segment wall thickening involving the distal/terminal ileum, abnormal. This appearance may be reactive given the left adnexal process, but infectious/inflammatory ileitis is not excluded. At a minimum, GI follow-up after resolution of the pelvic inflammatory process is suggested. Small to moderate pelvic ascites, mildly masslike. Associated fluid/ascites along the small bowel mesentery. Electronically Signed   By: Julian Hy M.D.   On: 10/18/2020 13:35   MR PELVIS W WO CONTRAST  Result Date: 10/18/2020 CLINICAL DATA:  Pelvic mass on ultrasound  EXAM: MRI PELVIS WITHOUT AND WITH CONTRAST TECHNIQUE: Multiplanar multisequence MR imaging of the pelvis was performed both before and after administration of intravenous contrast. CONTRAST:  45m GADAVIST GADOBUTROL 1 MMOL/ML IV SOLN COMPARISON:  Pelvic ultrasound dated 10/06/2020 FINDINGS: Urinary Tract:  Bladder is within normal limits. Bowel: Long segment wall thickening involving the distal/terminal ileum (series 5/images 10 and 13), nonspecific. Vascular/Lymphatic: No evidence of aneurysm. No suspicious pelvic lymphadenopathy. Reproductive:  Uterus is within normal limits. Right ovary is within normal limits. 3.8 x 3.8 x 2.9 cm peripherally enhancing left ovarian mass (series 3/image 16). No significant intrinsic T1 hyperintensity on precontrast imaging. As such, this appearance favors a tubo-ovarian abscess over a hemorrhagic corpus luteum. Other: Moderate pelvic ascites, partially loculated with mass effect in the dependent pelvis. Musculoskeletal: No focal osseous lesions. IMPRESSION: 3.8 cm left ovarian mass, favoring a small tubo-ovarian abscess. Long segment wall thickening involving the distal/terminal ileum, nonspecific and likely reactive given the left adnexal inflammatory process favored above. Associated moderate pelvic ascites, partially loculated with mass-effect in the dependent pelvis. Electronically Signed   By: SJulian HyM.D.   On: 10/18/2020 07:27   CT ASPIRATION  Result Date: 10/19/2020 CLINICAL DATA:  Pelvic fluid and possible left tubo-ovarian abscess. EXAM: CT GUIDED ASPIRATION OF PELVIC FLUID COLLECTION ANESTHESIA/SEDATION: 2.0 mg IV Versed 75 mcg IV Fentanyl Total Moderate Sedation Time:  19 minutes The patient's level of consciousness and physiologic status were continuously monitored during the procedure by Radiology nursing. PROCEDURE: The procedure, risks, benefits, and alternatives were explained to the patient. Questions regarding the procedure were encouraged and  answered. The patient understands and consents to the procedure. A time out was performed prior to initiating the procedure. CT of the pelvis was performed in a prone position. The left gluteal region was prepped with chlorhexidine in a sterile fashion, and a sterile drape was applied covering the operative field. A sterile gown and sterile gloves were used for the procedure. Local anesthesia was provided with 1% Lidocaine. From a posterior left transgluteal approach, a 5 [place] centesis catheter was advanced over a 19 gauge needle under CT guidance to the level of a posterior pelvic fluid collection. After confirming catheter position, aspiration was performed initially with a syringe for a sample which was sent for fluid cultures followed by vacuum bottle evacuation. The catheter was removed and additional CT performed. COMPLICATIONS: None FINDINGS: Aspiration at the level of the posterior pelvic fluid collection located in the cul-de-sac yielded clear, yellow fluid. A sample was sent for culture analysis. A total of 280 mL of fluid was able to be removed resulting in near complete decompression of the fluid collection  by CT. There is no safe anterior or posterior percutaneous window to the level of the left ovary. IMPRESSION: CT-guided aspiration of pelvic fluid collection. A sample of clear, yellow fluid was sent for culture analysis. A total of 280 mL of fluid was able to be removed resulting in near complete decompression of the fluid collection by CT. Electronically Signed   By: Aletta Edouard M.D.   On: 10/19/2020 13:40   US PELVIC DOPPLER (TORSION R/O OR MASS ARTERIAL FLOW)  Result Date: 10/14/2020 CLINICAL DATA:  Severe pelvic pain for 1 week. Last menstrual period 09/23/2020. EXAM: TRANSABDOMINAL ULTRASOUND OF PELVIS DOPPLER ULTRASOUND OF OVARIES TECHNIQUE: Transabdominal ultrasound examination of the pelvis was performed including evaluation of the uterus, ovaries, adnexal regions, and pelvic  cul-de-sac. Color and duplex Doppler ultrasound was utilized to evaluate blood flow to the ovaries. COMPARISON:  None. FINDINGS: Measurements: 6.3 x 2.4 x 3.4 cm = volume: 27 mL. No fibroids or other mass visualized. Endometrium Thickness: 3 mm.  No focal abnormality visualized. Right ovary Measurements: 5.9 x 4.1 x 4.3 cm = volume: 54 mL. A right adnexal mass has multiple thin septations and measures 10.0 x 6.1 x 7.2 cm. Left ovary Measurements: 5.0 x 4.7 x 5.3 cm = volume: 65 mL. The left ovary has a heterogeneous appearance. Pulsed Doppler evaluation demonstrates normal low-resistance arterial and venous waveforms in both ovaries. Other findings: A heterogeneous soft tissue mass superior to the uterus measures 8.0 x 4.8 x 8.6 cm and demonstrates internal blood flow. Small volume free fluid is seen in the pelvis. IMPRESSION: 1. Enlarged and heterogeneous appearing left ovary may reflect infection or torsion. 2. Large cystic mass in the vicinity of the right adnexa may reflect hydrosalpinx, pyosalpinx, or an adnexal cyst with multiple thin septations. 3. Heterogeneous soft tissue mass superior to the uterus is nonspecific and may reflect a tubo-ovarian abscess. These results were called by telephone at the time of interpretation on 10/14/2020 at 2:12 pm to the patient's provider, who verbally acknowledged these results. Electronically Signed   By: Zerita Boers M.D.   On: 10/14/2020 14:12   US PELVIC COMPLETE WITH TRANSVAGINAL  Result Date: 11/08/2020 CLINICAL DATA:  Recent tubo-ovarian abscess post drainage on 10/19/2020 and antibiotic therapy, continued pain; LMP 10/30/2020 EXAM: TRANSABDOMINAL AND TRANSVAGINAL ULTRASOUND OF PELVIS TECHNIQUE: Both transabdominal and transvaginal ultrasound examinations of the pelvis were performed. Transabdominal technique was performed for global imaging of the pelvis including uterus, ovaries, adnexal regions, and pelvic cul-de-sac. It was necessary to proceed with  endovaginal exam following the transabdominal exam to visualize the uterus and ovaries. COMPARISON:  10/14/2020 Correlation: CT pelvis 10/18/2020, 10/19/2020 FINDINGS: Uterus Measurements: 6.6 x 2.6 x 2.9 cm = volume: 26 mL. Anteverted. Normal morphology without mass Endometrium Thickness: 5 mm.  No endometrial fluid or focal abnormality Right ovary Not definitely visualized, see below Left ovary Measurements: 3.9 x 3.0 x 4.0 cm = volume: 24.1 mL. Questionably visualized, potentially containing a small hemorrhagic corpus luteum 2.6 cm diameter. Other findings Large complex multiloculated fluid collection identified within the pelvis, in excess of 14 cm diameter, especially in cul-de-sac but extending LEFT and RIGHT in the pelvis. Scattered septations, wall irregularity, debris, and minimal nodularity seen. Finding is consistent with residual or recurrent abscess. It is difficult to distinguish RIGHT ovary and presence or absence of dilated fallopian tubes due to the large complex nature of the collection. IMPRESSION: Unremarkable uterus and endometrial complex. Large complex multiloculated fluid collection in the pelvis in excess of  14 cm diameter containing multiple thin septations, debris, and irregularity. It is uncertain whether this represents a complicated collection such as a tubo-ovarian abscess or complicated pelvic ascites. Aspirate from prior drainage procedure was negative for organisms on gram stain and demonstrated no growth on culture. May consider further imaging such as by MR with and without contrast to further characterize, or may consider aspiration. Discussed with Dr. Elonda Husky on 11/08/2020 at 1530 hours. Electronically Signed   By: Lavonia Dana M.D.   On: 11/08/2020 17:51    Labs:  CBC: Recent Labs    10/20/20 0036 11/03/20 1704 11/04/20 1432 11/10/20 1453  WBC 13.0* 12.7* 12.5* 12.3*  HGB 9.4* 11.6 10.5* 10.0*  HCT 31.3* 38.7 35.4* 34.2*  PLT 678* 748* 734* 699*    COAGS: Recent  Labs    10/18/20 1445 11/10/20 1453  INR 1.1 1.2  APTT  --  38*    BMP: Recent Labs    10/13/20 0943 10/14/20 1501  NA 136 134*  K 3.3* 3.3*  CL 100 98  CO2 25 26  GLUCOSE 102* 92  BUN 16 10  CALCIUM 9.1 8.6*  CREATININE 0.86 0.72  GFRNONAA >60 >60    LIVER FUNCTION TESTS: Recent Labs    10/13/20 0943  BILITOT 0.2*  AST 16  ALT 11  ALKPHOS 65  PROT 8.8*  ALBUMIN 3.4*    TUMOR MARKERS: No results for input(s): AFPTM, CEA, CA199, CHROMGRNA in the last 8760 hours.  Assessment and Plan: Abdominal pain Intra-abdominal fluid collections CT shows complex fistulous network with abscess involving the left adnexa.  Concern for Crohn's disease.   Per patient, history of prior work-up which was negative/unrevealing.  She underwent aspiration 11/2 with Dr. Kathlene Cote which did provide short-term relief. Culture from that aspiration was negative for organism growth. CT reviewed by Dr. Vernard Gambles who approves patient for aspiration and drainage of collection(s).  Discussed with patient at bedside and sister via Facetime.  All in agreement to proceed with aspiration and drainage as IR schedule allows.  Currently NPO INR 1.2  Risks and benefits discussed with the patient including bleeding, infection, damage to adjacent structures, bowel perforation/fistula connection, and sepsis.  All of the patient's questions were answered, patient is agreeable to proceed. Consent signed and in chart.  Thank you for this interesting consult.  I greatly enjoyed meeting [facility] and look forward to participating in their care.  A copy of this report was sent to the requesting provider on this date.  Electronically Signed: Docia Barrier, PA 11/11/2020, 1:31 PM   I spent a total of 40 Minutes    in face to face in clinical consultation, greater than 50% of which was counseling/coordinating care for intra-abdominal fluid collection.

## 2020-11-11 NOTE — Consult Note (Signed)
Reason for Consult:recurrent pelvic abscess, suspected Crohn's disease Referring Physician: [name] is an 19 y.o. female.  HPI: 19yo F who was admitted 10/28 through 11/3 for a pelvic abscess/TOA.  She underwent aspiration by IR on 11/2.  No organisms grew out.  She had ongoing pain on the follow-up visit and was placed on doxycycline and Flagyl.  She underwent ultrasound and was referred to the hospital.  She underwent CT scan of the abdomen pelvis which shows a large recurrent pelvic abscess and signs very suspicious of Crohn's disease.  We are asked to see her from a surgical standpoint.  She does endorse a history of intermittent crampy abdominal pain over the years associated with some diarrhea.  She has never been diagnosed with IBD before.  Past Medical History:  Diagnosis Date  . Abdominal pain   . Ovarian cyst     Past Surgical History:  Procedure Laterality Date  . TIBIA FRACTURE SURGERY      History reviewed. No pertinent family history.  Social History:  reports that she has been smoking. She has never used smokeless tobacco. She reports current alcohol use.  Drug: Marijuana.  Allergies: No Known Allergies  Medications: I have reviewed the patient's current medications.  Results for orders placed or performed during the hospital encounter of 11/10/20 (from the past 48 hour(s))  Hepatitis B surface antigen     Status: None   Collection Time: 11/10/20  2:53 PM  Result Value Ref Range   Hepatitis B Surface Ag NON REACTIVE NON REACTIVE    Comment: Performed at [facility], [address], [place], [place] [address]  Hepatitis C antibody     Status: None   Collection Time: 11/10/20  2:53 PM  Result Value Ref Range   HCV Ab NON REACTIVE NON REACTIVE    Comment: (NOTE) Nonreactive HCV antibody screen is consistent with no HCV infections,  unless recent infection is suspected or other evidence exists to indicate HCV infection.  Performed at [facility], [place] [address] [place], [place] [address]   CBC WITH DIFFERENTIAL     Status: Abnormal   Collection Time: 11/10/20  2:53 PM  Result Value Ref Range   WBC 12.3 (H) 4.0 - 10.5 K/uL   RBC 5.16 (H) 3.87 - 5.11 MIL/uL   Hemoglobin 10.0 (L) 12.0 - 15.0 g/dL   HCT 34.2 (L) 36 - 46 %   MCV 66.3 (L) 80.0 - 100.0 fL   MCH 19.4 (L) 26.0 - 34.0 pg   MCHC 29.2 (L) 30.0 - 36.0 g/dL   RDW 26.8 (H) 11.5 - 15.5 %   Platelets 699 (H) 150 - 400 K/uL   nRBC 0.0 0.0 - 0.2 %   Neutrophils Relative % 78 %   Neutro Abs 9.6 (H) 1.7 - 7.7 K/uL   Lymphocytes Relative 12 %   Lymphs Abs 1.5 0.7 - 4.0 K/uL   Monocytes Relative 5 %   Monocytes Absolute 0.6 0.1 - 1.0 K/uL   Eosinophils Relative 5 %   Eosinophils Absolute 0.6 (H) 0.0 - 0.5 K/uL   Basophils Relative 0 %   Basophils Absolute 0.0 0.0 - 0.1 K/uL   nRBC 0 0 /100 WBC   Abs Immature Granulocytes 0.00 0.00 - 0.07 K/uL    Comment: Performed at [facility], [place] [address] [place], [place] [address]  Rapid HIV screen (HIV 1/2 Ab+Ag)     Status: None   Collection  Time: 11/10/20  2:53 PM  Result Value Ref Range   HIV-1 P24 Antigen - HIV24 NON REACTIVE NON REACTIVE   HIV 1/2 Antibodies NON REACTIVE NON REACTIVE   Interpretation (HIV Ag Ab) NON REACTIVE     Comment: Performed at [facility], [address] [place], [place] [address]  APTT     Status: Abnormal   Collection Time: 11/10/20  2:53 PM  Result Value Ref Range   aPTT 38 (H) 24 - 36 seconds    Comment:        IF BASELINE aPTT IS ELEVATED, SUGGEST PATIENT RISK ASSESSMENT BE USED TO DETERMINE APPROPRIATE ANTICOAGULANT THERAPY. Performed at [facility], [place] [address] [place], [place] [address]   Protime-INR     Status: None   Collection Time: 11/10/20  2:53 PM  Result Value Ref Range   Prothrombin Time 14.6 11.4 - 15.2 seconds   INR 1.2 0.8 - 1.2    Comment: (NOTE) INR goal varies based on device and disease states. Performed at [facility], [place] [address] [place], [place] [address]   Fibrinogen     Status: Abnormal   Collection Time: 11/10/20  2:53 PM  Result Value Ref Range   Fibrinogen 544 (H) 210 - 475 mg/dL    Comment: Performed at [facility] [address] [place], [place] [address]  hCG, quantitative, pregnancy     Status: None   Collection Time: 11/10/20  3:14 PM  Result Value Ref Range   hCG, Beta Chain, Quant, S <1 <5 mIU/mL    Comment:          GEST. AGE      CONC.  (mIU/mL)   <=1 WEEK        5 - 50     2 WEEKS       50 - 500     3 WEEKS       100 - 10,000     4 WEEKS     1,000 - 30,000     5 WEEKS     3,500 - 115,000   6-8 WEEKS     12,000 - 270,000    12 WEEKS     15,000 - 220,000        FEMALE AND NON-PREGNANT FEMALE:     LESS THAN 5 mIU/mL Performed at [facility], [place] [address] [place], [place] [address]   Type and screen [place]     Status: None   Collection Time: 11/10/20  3:21 PM  Result Value Ref Range   ABO/RH(D) O POS    Antibody Screen NEG    Sample Expiration      11/13/2020,2359 Performed at [facility], [place] [address], [place],  [address]   Resp Panel by RT-PCR (Flu A&B, Covid) Nasopharyngeal Swab     Status: None   Collection Time: 11/10/20  4:14 PM   Specimen: Nasopharyngeal Swab; Nasopharyngeal(NP) swabs in vial transport medium  Result Value Ref Range   SARS Coronavirus 2 by RT PCR NEGATIVE NEGATIVE    Comment: (NOTE) SARS-CoV-2 target nucleic acids are NOT DETECTED.  The SARS-CoV-2 RNA is generally detectable in upper respiratory specimens during the acute phase of infection. The lowest concentration of SARS-CoV-2 viral copies this assay can detect is 138 copies/mL. A negative result does not preclude SARS-Cov-2 infection and should not be used as the sole basis for treatment or other patient  management decisions. A negative result may occur with  improper specimen collection/handling, submission of specimen other than  nasopharyngeal swab, presence of viral mutation(s) within the areas targeted by this assay, and inadequate number of viral copies(<138 copies/mL). A negative result must be combined with clinical observations, patient history, and epidemiological information. The expected result is Negative.  Fact Sheet for Patients:  EntrepreneurPulse.com.au  Fact Sheet for Healthcare Providers:  IncredibleEmployment.be  This test is no t yet approved or cleared by the [place] FDA and  has been authorized for detection and/or diagnosis of SARS-CoV-2 by FDA under an Emergency Use Authorization (EUA). This EUA will remain  in effect (meaning this test can be used) for the duration of the COVID-19 declaration under Section 564(b)(1) of the Act, 21 U.S.C.section 360bbb-3(b)(1), unless the authorization is terminated  or revoked sooner.       Influenza A by PCR NEGATIVE NEGATIVE   Influenza B by PCR NEGATIVE NEGATIVE    Comment: (NOTE) The Xpert Xpress SARS-CoV-2/FLU/RSV plus assay is intended as an aid in the diagnosis of influenza from Nasopharyngeal swab specimens and should not be used as a sole basis for treatment. Nasal washings and aspirates are unacceptable for Xpert Xpress SARS-CoV-2/FLU/RSV testing.  Fact Sheet for Patients: EntrepreneurPulse.com.au  Fact Sheet for Healthcare Providers: IncredibleEmployment.be  This test is not yet approved or cleared by the [place] FDA and has been authorized for detection and/or diagnosis of SARS-CoV-2 by FDA under an Emergency Use Authorization (EUA). This EUA will remain in effect (meaning this test can be used) for the duration of the COVID-19 declaration under Section 564(b)(1) of the Act, 21 U.S.C. section 360bbb-3(b)(1), unless the authorization is terminated or revoked.  Performed at [facility], [place] [address], [place], [place] [address]     CT  PELVIS W CONTRAST  Result Date: 11/10/2020 CLINICAL DATA:  Tubo-ovarian abscess EXAM: CT PELVIS WITH CONTRAST TECHNIQUE: Multidetector CT imaging of the pelvis was performed using the standard protocol following the bolus administration of intravenous contrast. CONTRAST:  160m OMNIPAQUE IOHEXOL 300 MG/ML  SOLN COMPARISON:  10/18/2020 FINDINGS: Urinary Tract: Urinary bladder is under distended limiting assessment. Less distended than on the prior exam. No distal ureteral dilation. Bowel: Diffuse mural stratification and irregular thickening of small bowel loops in the pelvis. Complex fistulous network involving the distal ileum with tract extending from the distal ileum into the LEFT adnexa where there is a peripherally enhancing fluid containing structure that is similar to the prior study. This area measures approximately 3.2 x 2.4 cm (image 19 of series 5. Displacing bowel loops in the pelvis is a 12 x 6.8 cm area of fluid density with peripheral septation that has enlarged since the prior study where it measured 10 x 6 cm. Pseudo sacculation along the distal ileum is present with transmural fistulae and overt fistulization to LEFT adnexa and with presumed Fischel ization also to the sigmoid colon, LEFT adnexal component best seen on image 67 of series 7. 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 Fistulization of LEFT adnexa can be followed on images 94-103 as well into the LEFT adnexa. Extensive stranding about the pelvis with similar appearance to prior imaging. No free air to exhibited in the lower abdomen/upper pelvis. Potential fistulization as well to the appendix which is at the same level of potentials fistulization to the sigmoid colon. Upstream bowel loops are dilated with similar pattern to previous imaging. Vascular/Lymphatic: Patent  pelvic vasculature grossly. Venous structures not well assessed. No pelvic lymphadenopathy. Reproductive: Uterus displaced  from fluid collection in the RIGHT pelvis. Other: Mesenteric stranding in the low abdomen. This finding is similar to the previous imaging study. Musculoskeletal: Subtle sclerosis in the subchondral region of the bilateral femoral heads raising the question of early avascular necrosis. IMPRESSION: 1. Complex fistulous network in the pelvis leading to abscess in the LEFT adnexa in the setting of presumed inflammatory bowel disease, most suggestive of Crohn's disease. Correlate with patient history for repeated episodes of abdominal pain. 2. Enlarging more simple appearing fluid in the pelvis in the RIGHT pelvis displacing structures from RIGHT to LEFT likely a peritoneal inclusion cyst in the setting of repeated inflammation based on the appearance of small bowel loops. This could also represent a hydrosalpinx. Correlate with fluid sampling which was performed on October 19, 2020 possibility of interval super infection is considered though the appearance while enlarged is otherwise similar to previous imaging. The 3. Upstream bowel loops are dilated likely related to partial obstruction but unchanged from previous exam. 4. Subtle sclerosis in the subchondral region of the bilateral femoral heads raising the question of early avascular necrosis. These results will be called to the ordering clinician or representative by the Radiologist Assistant, and communication documented in the PACS or [employer]. Electronically Signed   By: Zetta Bills M.D.   On: 11/10/2020 17:56    Review of Systems  Constitutional: Positive for appetite change. Negative for activity change.  HENT: Negative.   Eyes: Negative.   Respiratory: Negative for chest tightness and shortness of breath.   Cardiovascular: Negative for chest pain.  Gastrointestinal: Positive for abdominal pain and nausea. Negative for blood in stool and constipation.  Endocrine: Negative.   Genitourinary: Negative.   Musculoskeletal: Negative.   Skin:  Negative.   Allergic/Immunologic: Negative.   Neurological: Negative.   Hematological: Negative.   Psychiatric/Behavioral: Negative.    Blood pressure 113/71, pulse (!) 102, temperature 99.2 F (37.3 C), temperature source Oral, resp. rate 16, height 5' 5"  (1.651 m), weight 51.4 kg, last menstrual period 11/06/2020, SpO2 100 %. Physical Exam Constitutional:      General: She is not in acute distress.    Appearance: Normal appearance. She is not diaphoretic.  HENT:     Head: Normocephalic.     Right Ear: External ear normal.     Left Ear: External ear normal.     Nose: Nose normal.     Mouth/Throat:     Mouth: Mucous membranes are moist.  Eyes:     General: No scleral icterus.    Pupils: Pupils are equal, round, and reactive to light.  Cardiovascular:     Rate and Rhythm: Normal rate and regular rhythm.     Pulses: Normal pulses.  Pulmonary:     Effort: Pulmonary effort is normal. No respiratory distress.     Breath sounds: Normal breath sounds. No wheezing or rhonchi.  Chest:     Chest wall: No tenderness.  Abdominal:     General: Abdomen is flat. There is no distension.     Palpations: There is no mass.     Tenderness: There is abdominal tenderness. There is no guarding or rebound.     Hernia: No hernia is present.     Comments: Quite tender supra-pubic area but no peritonitis, upper abdomen NT  Musculoskeletal:        General: No swelling or tenderness.     Cervical back: Normal  range of motion and neck supple. No tenderness.  Skin:    General: Skin is warm and dry.     Capillary Refill: Capillary refill takes less than 2 seconds.  Neurological:     Mental Status: She is alert and oriented to person, place, and time.     Motor: No weakness.  Psychiatric:        Mood and Affect: Mood normal.     Assessment/Plan: Recurrent pelvic abscess with evidence of Crohn's disease -agree with IR consult for percutaneous drainage.  Additionally, agree with continuing IV  antibiotics and bowel rest.  She is on Mefoxin and doxycycline.  I also recommend gastroenterology consultation for further evaluation of possible Crohn's disease.  No need for emergent surgical intervention.  We will follow along.  Zenovia Jarred 11/11/2020, 10:58 AM

## 2020-11-12 ENCOUNTER — Inpatient Hospital Stay (HOSPITAL_COMMUNITY): Payer: Medicaid Other

## 2020-11-12 MED ORDER — FENTANYL CITRATE (PF) 100 MCG/2ML IJ SOLN
INTRAMUSCULAR | Status: AC | PRN
Start: 2020-11-12 — End: 2020-11-12
  Administered 2020-11-12 (×2): 50 ug via INTRAVENOUS

## 2020-11-12 MED ORDER — MIDAZOLAM HCL 2 MG/2ML IJ SOLN
INTRAMUSCULAR | Status: AC
  Filled 2020-11-12: qty 2

## 2020-11-12 MED ORDER — FENTANYL CITRATE (PF) 100 MCG/2ML IJ SOLN
INTRAMUSCULAR | Status: AC
  Filled 2020-11-12: qty 2

## 2020-11-12 MED ORDER — SODIUM CHLORIDE 0.9% FLUSH
5.0000 mL | Freq: Three times a day (TID) | INTRAVENOUS | Status: DC
  Administered 2020-11-12 – 2020-11-18 (×19): 5 mL

## 2020-11-12 MED ORDER — MIDAZOLAM HCL 2 MG/2ML IJ SOLN
INTRAMUSCULAR | Status: AC | PRN
  Administered 2020-11-12 (×2): 1 mg via INTRAVENOUS

## 2020-11-12 NOTE — Progress Notes (Signed)
Faculty [facility] OB/GYN Attending Progress Note  Subjective:  Lower abdominal pain still persistent, now 8/10. Getting analgesia around the clock. Has been NPO since last night for possible IR drainage today. No nausea or vomiting.   Admitted on 11/10/2020 for TOA (tubo-ovarian abscess).     Objective:  Blood pressure 106/74, pulse 99, temperature 98.7 F (37.1 C), temperature source Oral, resp. rate 18, height 5' 5"  (1.651 m), weight 51.4 kg, last menstrual period 11/06/2020, SpO2 100 %. Gen: NAD HENT: Normocephalic, atraumatic Lungs: Normal respiratory effort Heart: Regular rate noted Abdomen: soft, marked tenderness in lower abdomen diffusely to palpation, no rebound or guarding Cervix: Deferred Ext: 2+ DTRs, no edema, no cyanosis, negative Homan's sign  Studies: CBC Latest Ref Rng & Units 11/10/2020 11/04/2020 11/03/2020  WBC 4.0 - 10.5 K/uL 12.3(H) 12.5(H) 12.7(H)  Hemoglobin 12.0 - 15.0 g/dL 10.0(L) 10.5(L) 11.6  Hematocrit 36 - 46 % 34.2(L) 35.4(L) 38.7  Platelets 150 - 400 K/uL 699(H) 734(H) 748(H)   CMP Latest Ref Rng & Units 10/14/2020 10/13/2020  Glucose 70 - 99 mg/dL 92 102(H)  BUN 6 - 20 mg/dL 10 16  Creatinine 0.44 - 1.00 mg/dL 0.72 0.86  Sodium 135 - 145 mmol/L 134(L) 136  Potassium 3.5 - 5.1 mmol/L 3.3(L) 3.3(L)  Chloride 98 - 111 mmol/L 98 100  CO2 22 - 32 mmol/L 26 25  Calcium 8.9 - 10.3 mg/dL 8.6(L) 9.1  Total Protein 6.5 - 8.1 g/dL - 8.8(H)  Total Bilirubin 0.3 - 1.2 mg/dL - 0.2(L)  Alkaline Phos 38 - 126 U/L - 65  AST 15 - 41 U/L - 16  ALT 0 - 44 U/L - 11   CT PELVIS W CONTRAST  Result Date: 11/10/2020 CLINICAL DATA:  Tubo-ovarian abscess EXAM: CT PELVIS WITH CONTRAST TECHNIQUE: Multidetector CT imaging of the pelvis was performed using the standard protocol following the bolus administration of intravenous contrast. CONTRAST:  135m OMNIPAQUE IOHEXOL 300 MG/ML  SOLN COMPARISON:  10/18/2020 FINDINGS: Urinary Tract: Urinary bladder is under  distended limiting assessment. Less distended than on the prior exam. No distal ureteral dilation. Bowel: Diffuse mural stratification and irregular thickening of small bowel loops in the pelvis. Complex fistulous network involving the distal ileum with tract extending from the distal ileum into the LEFT adnexa where there is a peripherally enhancing fluid containing structure that is similar to the prior study. This area measures approximately 3.2 x 2.4 cm (image 19 of series 5. Displacing bowel loops in the pelvis is a 12 x 6.8 cm area of fluid density with peripheral septation that has enlarged since the prior study where it measured 10 x 6 cm. Pseudo sacculation along the distal ileum is present with transmural fistulae and overt fistulization to LEFT adnexa and with presumed Fischel ization also to the sigmoid colon, LEFT adnexal component best seen on image 67 of series 7. 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 Fistulization of LEFT adnexa can be followed on images 94-103 as well into the LEFT adnexa. Extensive stranding about the pelvis with similar appearance to prior imaging. No free air to exhibited in the lower abdomen/upper pelvis. Potential fistulization as well to the appendix which is at the same level of potentials fistulization to the sigmoid colon. Upstream bowel loops are dilated with similar pattern to previous imaging. Vascular/Lymphatic: Patent pelvic vasculature grossly. Venous structures not well assessed. No pelvic lymphadenopathy. Reproductive: Uterus displaced from fluid collection in the RIGHT pelvis.  Other: Mesenteric stranding in the low abdomen. This finding is similar to the previous imaging study. Musculoskeletal: Subtle sclerosis in the subchondral region of the bilateral femoral heads raising the question of early avascular necrosis. IMPRESSION: 1. Complex fistulous network in the pelvis leading to abscess in the LEFT adnexa in the  setting of presumed inflammatory bowel disease, most suggestive of Crohn's disease. Correlate with patient history for repeated episodes of abdominal pain. 2. Enlarging more simple appearing fluid in the pelvis in the RIGHT pelvis displacing structures from RIGHT to LEFT likely a peritoneal inclusion cyst in the setting of repeated inflammation based on the appearance of small bowel loops. This could also represent a hydrosalpinx. Correlate with fluid sampling which was performed on October 19, 2020 possibility of interval super infection is considered though the appearance while enlarged is otherwise similar to previous imaging. The 3. Upstream bowel loops are dilated likely related to partial obstruction but unchanged from previous exam. 4. Subtle sclerosis in the subchondral region of the bilateral femoral heads raising the question of early avascular necrosis. These results will be called to the ordering clinician or representative by the Radiologist Assistant, and communication documented in the PACS or [employer]. Electronically Signed   By: Zetta Bills M.D.   On: 11/10/2020 17:56   CT PELVIS W CONTRAST  Result Date: 10/18/2020 CLINICAL DATA:  Tubo-ovarian abscess EXAM: CT PELVIS WITH CONTRAST TECHNIQUE: Multidetector CT imaging of the pelvis was performed using the standard protocol following the bolus administration of intravenous contrast. CONTRAST:  152m OMNIPAQUE IOHEXOL 300 MG/ML  SOLN COMPARISON:  MR pelvis dated 10/17/2020. Ultrasound pelvis dated 10/14/2020. FINDINGS: Urinary Tract:  Bladder is within normal limits. Bowel: Long segment wall thickening involving the distal/terminal ileum (series 3/image 15), underdistended but abnormal. Vascular/Lymphatic: No evidence of aneurysm. No suspicious pelvic lymphadenopathy. Reproductive:  Uterus is within normal limits. Right ovary is likely within normal limits (series 3/image 20). 3.1 x 3.7 cm irregular enhancing lesion in the left ovary  (series 3/image 20; sagittal image 92). Other: Small to moderate pelvic ascites, mildly masslike. Associated fluid/ascites along the small bowel mesentery (series 3/images 11 and 14). Musculoskeletal: Visualized osseous structures are within normal limits. IMPRESSION: 3.7 cm irregular enhancing lesion in the left ovary, favoring a tubo-ovarian abscess on recent MRI. Long segment wall thickening involving the distal/terminal ileum, abnormal. This appearance may be reactive given the left adnexal process, but infectious/inflammatory ileitis is not excluded. At a minimum, GI follow-up after resolution of the pelvic inflammatory process is suggested. Small to moderate pelvic ascites, mildly masslike. Associated fluid/ascites along the small bowel mesentery. Electronically Signed   By: SJulian HyM.D.   On: 10/18/2020 13:35   MR PELVIS W WO CONTRAST  Result Date: 10/18/2020 CLINICAL DATA:  Pelvic mass on ultrasound EXAM: MRI PELVIS WITHOUT AND WITH CONTRAST TECHNIQUE: Multiplanar multisequence MR imaging of the pelvis was performed both before and after administration of intravenous contrast. CONTRAST:  52mGADAVIST GADOBUTROL 1 MMOL/ML IV SOLN COMPARISON:  Pelvic ultrasound dated 10/06/2020 FINDINGS: Urinary Tract:  Bladder is within normal limits. Bowel: Long segment wall thickening involving the distal/terminal ileum (series 5/images 10 and 13), nonspecific. Vascular/Lymphatic: No evidence of aneurysm. No suspicious pelvic lymphadenopathy. Reproductive:  Uterus is within normal limits. Right ovary is within normal limits. 3.8 x 3.8 x 2.9 cm peripherally enhancing left ovarian mass (series 3/image 16). No significant intrinsic T1 hyperintensity on precontrast imaging. As such, this appearance favors a tubo-ovarian abscess over a hemorrhagic corpus luteum. Other:  Moderate pelvic ascites, partially loculated with mass effect in the dependent pelvis. Musculoskeletal: No focal osseous lesions. IMPRESSION: 3.8 cm  left ovarian mass, favoring a small tubo-ovarian abscess. Long segment wall thickening involving the distal/terminal ileum, nonspecific and likely reactive given the left adnexal inflammatory process favored above. Associated moderate pelvic ascites, partially loculated with mass-effect in the dependent pelvis. Electronically Signed   By: Julian Hy M.D.   On: 10/18/2020 07:27   CT ASPIRATION  Result Date: 10/19/2020 CLINICAL DATA:  Pelvic fluid and possible left tubo-ovarian abscess. EXAM: CT GUIDED ASPIRATION OF PELVIC FLUID COLLECTION ANESTHESIA/SEDATION: 2.0 mg IV Versed 75 mcg IV Fentanyl Total Moderate Sedation Time:  19 minutes The patient's level of consciousness and physiologic status were continuously monitored during the procedure by Radiology nursing. PROCEDURE: The procedure, risks, benefits, and alternatives were explained to the patient. Questions regarding the procedure were encouraged and answered. The patient understands and consents to the procedure. A time out was performed prior to initiating the procedure. CT of the pelvis was performed in a prone position. The left gluteal region was prepped with chlorhexidine in a sterile fashion, and a sterile drape was applied covering the operative field. A sterile gown and sterile gloves were used for the procedure. Local anesthesia was provided with 1% Lidocaine. From a posterior left transgluteal approach, a 5 [place] Yueh centesis catheter was advanced over a 19 gauge needle under CT guidance to the level of a posterior pelvic fluid collection. After confirming catheter position, aspiration was performed initially with a syringe for a sample which was sent for fluid cultures followed by vacuum bottle evacuation. The catheter was removed and additional CT performed. COMPLICATIONS: None FINDINGS: Aspiration at the level of the posterior pelvic fluid collection located in the cul-de-sac yielded clear, yellow fluid. A sample was sent for culture  analysis. A total of 280 mL of fluid was able to be removed resulting in near complete decompression of the fluid collection by CT. There is no safe anterior or posterior percutaneous window to the level of the left ovary. IMPRESSION: CT-guided aspiration of pelvic fluid collection. A sample of clear, yellow fluid was sent for culture analysis. A total of 280 mL of fluid was able to be removed resulting in near complete decompression of the fluid collection by CT. Electronically Signed   By: Aletta Edouard M.D.   On: 10/19/2020 13:40   US PELVIC DOPPLER (TORSION R/O OR MASS ARTERIAL FLOW)  Result Date: 10/14/2020 CLINICAL DATA:  Severe pelvic pain for 1 week. Last menstrual period 09/23/2020. EXAM: TRANSABDOMINAL ULTRASOUND OF PELVIS DOPPLER ULTRASOUND OF OVARIES TECHNIQUE: Transabdominal ultrasound examination of the pelvis was performed including evaluation of the uterus, ovaries, adnexal regions, and pelvic cul-de-sac. Color and duplex Doppler ultrasound was utilized to evaluate blood flow to the ovaries. COMPARISON:  None. FINDINGS: Measurements: 6.3 x 2.4 x 3.4 cm = volume: 27 mL. No fibroids or other mass visualized. Endometrium Thickness: 3 mm.  No focal abnormality visualized. Right ovary Measurements: 5.9 x 4.1 x 4.3 cm = volume: 54 mL. A right adnexal mass has multiple thin septations and measures 10.0 x 6.1 x 7.2 cm. Left ovary Measurements: 5.0 x 4.7 x 5.3 cm = volume: 65 mL. The left ovary has a heterogeneous appearance. Pulsed Doppler evaluation demonstrates normal low-resistance arterial and venous waveforms in both ovaries. Other findings: A heterogeneous soft tissue mass superior to the uterus measures 8.0 x 4.8 x 8.6 cm and demonstrates internal blood flow. Small volume free fluid is  seen in the pelvis. IMPRESSION: 1. Enlarged and heterogeneous appearing left ovary may reflect infection or torsion. 2. Large cystic mass in the vicinity of the right adnexa may reflect hydrosalpinx, pyosalpinx,  or an adnexal cyst with multiple thin septations. 3. Heterogeneous soft tissue mass superior to the uterus is nonspecific and may reflect a tubo-ovarian abscess. These results were called by telephone at the time of interpretation on 10/14/2020 at 2:12 pm to the patient's provider, who verbally acknowledged these results. Electronically Signed   By: Zerita Boers M.D.   On: 10/14/2020 14:12   US PELVIC COMPLETE WITH TRANSVAGINAL  Result Date: 11/08/2020 CLINICAL DATA:  Recent tubo-ovarian abscess post drainage on 10/19/2020 and antibiotic therapy, continued pain; LMP 10/30/2020 EXAM: TRANSABDOMINAL AND TRANSVAGINAL ULTRASOUND OF PELVIS TECHNIQUE: Both transabdominal and transvaginal ultrasound examinations of the pelvis were performed. Transabdominal technique was performed for global imaging of the pelvis including uterus, ovaries, adnexal regions, and pelvic cul-de-sac. It was necessary to proceed with endovaginal exam following the transabdominal exam to visualize the uterus and ovaries. COMPARISON:  10/14/2020 Correlation: CT pelvis 10/18/2020, 10/19/2020 FINDINGS: Uterus Measurements: 6.6 x 2.6 x 2.9 cm = volume: 26 mL. Anteverted. Normal morphology without mass Endometrium Thickness: 5 mm.  No endometrial fluid or focal abnormality Right ovary Not definitely visualized, see below Left ovary Measurements: 3.9 x 3.0 x 4.0 cm = volume: 24.1 mL. Questionably visualized, potentially containing a small hemorrhagic corpus luteum 2.6 cm diameter. Other findings Large complex multiloculated fluid collection identified within the pelvis, in excess of 14 cm diameter, especially in cul-de-sac but extending LEFT and RIGHT in the pelvis. Scattered septations, wall irregularity, debris, and minimal nodularity seen. Finding is consistent with residual or recurrent abscess. It is difficult to distinguish RIGHT ovary and presence or absence of dilated fallopian tubes due to the large complex nature of the collection.  IMPRESSION: Unremarkable uterus and endometrial complex. Large complex multiloculated fluid collection in the pelvis in excess of 14 cm diameter containing multiple thin septations, debris, and irregularity. It is uncertain whether this represents a complicated collection such as a tubo-ovarian abscess or complicated pelvic ascites. Aspirate from prior drainage procedure was negative for organisms on gram stain and demonstrated no growth on culture. May consider further imaging such as by MR with and without contrast to further characterize, or may consider aspiration. Discussed with Dr. Elonda Husky on 11/08/2020 at 1530 hours. Electronically Signed   By: Lavonia Dana M.D.   On: 11/08/2020 17:51    Assessment & Plan:  19 y.o. 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 Vernard Gambles.  Possible drainage today. Patient has been NPO since last night. - GI consulted, appreciate recommendations by Dr. Alessandra Bevels. Switched antibiotics to Cipro/Flagyl on 11/11/20 given concern about bowel involvement and Crohn's.  No need for urgent colonoscopy at this point and patient reported undergoing colonoscopy in 10/2019 in [place] which was negative for Crohn's.  - General Surgery consulted, appreciate recommendations by Dr. Lavone Neri. No need for urgent surgical intervention.  - Analgesia as needed - Continue close observation. Plan was discussed with patient and her nursing team this morning.   Verita Schneiders, MD, [facility] for [employer], [place]

## 2020-11-12 NOTE — Plan of Care (Signed)
Patient out of room, suspect downstairs for drainage of abscess.  Would follow abscess culture results, and would continue antibiotics, likely for protracted course (4+ weeks, ultimate type to be facilitated by culture/sensitivity findings).  No plans for further GI work-up or evaluation at this time, while we are working through treatment of her abscess.  [facility] will revisit Monday.

## 2020-11-12 NOTE — Procedures (Signed)
Interventional Radiology Procedure Note  Procedure: CT guided right pelvic fluid collection drain placement  Findings: Please refer to procedural dictation for full description. Successful CT guided 10 Fr pigtail drain placement into right pelvic fluid collection.  Yielded approximately 250 mL serous fluid.  Samples sent for cytology and culture.  Left pelvic collection guarded by overlying bowel and vasculature without safe window for percutaneous approach.  Complications: None immediate  Estimated Blood Loss: <5 mL  Recommendations: Keep to gravity drainage for now. Follow up culture, cytology. Recommend repeat CT pelvis with contrast in 24 hrs for re-assessment of left adnexal fluid collection - new, safe window for percutaneous intervention may be present.  If not, US guided transvaginal approach may be needed.   Ruthann Cancer, MD Pager: [phone]

## 2020-11-12 NOTE — Progress Notes (Signed)
Progress Note     Subjective: Intermittent lower abdominal pain. Denies nausea or vomiting. IR procedure scheduled for today.   Objective: Vital signs in last 24 hours: Temp:  [98.2 F (36.8 C)-99.1 F (37.3 C)] 98.7 F (37.1 C) (11/26 0536) Pulse Rate:  [93-100] 99 (11/26 0536) Resp:  [18] 18 (11/26 0536) BP: (99-115)/(61-78) 106/74 (11/26 0536) SpO2:  [100 %] 100 % (11/26 0536) Last BM Date: 11/10/20  Intake/Output from previous day: 11/25 0701 - 11/26 0700 In: 755.3 [P.O.:240; IV Piggyback:515.3] Out: -  Intake/Output this shift: No intake/output data recorded.  PE: General: pleasant, WD, thin female who is laying in bed in NAD HEENT: head is normocephalic, atraumatic.  Sclera are noninjected.  PERRL.  Ears and nose without any masses or lesions.  Mouth is pink and moist Heart: regular, rate, and rhythm.  Lungs: CTAB, no wheezes, rhonchi, or rales noted.  Respiratory effort nonlabored Abd: soft, NTmildly ttp in lower abdomen, ND, +BS, no masses, hernias, or organomegaly MS: all 4 extremities are symmetrical with no cyanosis, clubbing, or edema. Skin: warm and dry with no masses, lesions, or rashes Neuro: Cranial nerves 2-12 grossly intact, sensation is normal throughout Psych: A&Ox3 with an appropriate affect.   Lab Results:  Recent Labs    11/10/20 1453  WBC 12.3*  HGB 10.0*  HCT 34.2*  PLT 699*   BMET No results for input(s): NA, K, CL, CO2, GLUCOSE, BUN, CREATININE, CALCIUM in the last 72 hours. PT/INR Recent Labs    11/10/20 1453  LABPROT 14.6  INR 1.2   CMP     Component Value Date/Time   NA 134 (L) 10/14/2020 1501   K 3.3 (L) 10/14/2020 1501   CL 98 10/14/2020 1501   CO2 26 10/14/2020 1501   GLUCOSE 92 10/14/2020 1501   BUN 10 10/14/2020 1501   CREATININE 0.72 10/14/2020 1501   CALCIUM 8.6 (L) 10/14/2020 1501   PROT 8.8 (H) 10/13/2020 0943   ALBUMIN 3.4 (L) 10/13/2020 0943   AST 16 10/13/2020 0943   ALT 11 10/13/2020 0943   ALKPHOS 65  10/13/2020 0943   BILITOT 0.2 (L) 10/13/2020 0943   GFRNONAA >60 10/14/2020 1501   Lipase     Component Value Date/Time   LIPASE 31 10/13/2020 0943       Studies/Results: CT PELVIS W CONTRAST  Result Date: 11/10/2020 CLINICAL DATA:  Tubo-ovarian abscess EXAM: CT PELVIS WITH CONTRAST TECHNIQUE: Multidetector CT imaging of the pelvis was performed using the standard protocol following the bolus administration of intravenous contrast. CONTRAST:  169m OMNIPAQUE IOHEXOL 300 MG/ML  SOLN COMPARISON:  10/18/2020 FINDINGS: Urinary Tract: Urinary bladder is under distended limiting assessment. Less distended than on the prior exam. No distal ureteral dilation. Bowel: Diffuse mural stratification and irregular thickening of small bowel loops in the pelvis. Complex fistulous network involving the distal ileum with tract extending from the distal ileum into the LEFT adnexa where there is a peripherally enhancing fluid containing structure that is similar to the prior study. This area measures approximately 3.2 x 2.4 cm (image 19 of series 5. Displacing bowel loops in the pelvis is a 12 x 6.8 cm area of fluid density with peripheral septation that has enlarged since the prior study where it measured 10 x 6 cm. Pseudo sacculation along the distal ileum is present with transmural fistulae and overt fistulization to LEFT adnexa and with presumed Fischel ization also to the sigmoid colon, LEFT adnexal component best seen on image 67 of series  7. 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 Fistulization of LEFT adnexa can be followed on images 94-103 as well into the LEFT adnexa. Extensive stranding about the pelvis with similar appearance to prior imaging. No free air to exhibited in the lower abdomen/upper pelvis. Potential fistulization as well to the appendix which is at the same level of potentials fistulization to the sigmoid colon. Upstream bowel loops are dilated  with similar pattern to previous imaging. Vascular/Lymphatic: Patent pelvic vasculature grossly. Venous structures not well assessed. No pelvic lymphadenopathy. Reproductive: Uterus displaced from fluid collection in the RIGHT pelvis. Other: Mesenteric stranding in the low abdomen. This finding is similar to the previous imaging study. Musculoskeletal: Subtle sclerosis in the subchondral region of the bilateral femoral heads raising the question of early avascular necrosis. IMPRESSION: 1. Complex fistulous network in the pelvis leading to abscess in the LEFT adnexa in the setting of presumed inflammatory bowel disease, most suggestive of Crohn's disease. Correlate with patient history for repeated episodes of abdominal pain. 2. Enlarging more simple appearing fluid in the pelvis in the RIGHT pelvis displacing structures from RIGHT to LEFT likely a peritoneal inclusion cyst in the setting of repeated inflammation based on the appearance of small bowel loops. This could also represent a hydrosalpinx. Correlate with fluid sampling which was performed on October 19, 2020 possibility of interval super infection is considered though the appearance while enlarged is otherwise similar to previous imaging. The 3. Upstream bowel loops are dilated likely related to partial obstruction but unchanged from previous exam. 4. Subtle sclerosis in the subchondral region of the bilateral femoral heads raising the question of early avascular necrosis. These results will be called to the ordering clinician or representative by the Radiologist Assistant, and communication documented in the PACS or [employer]. Electronically Signed   By: Zetta Bills M.D.   On: 11/10/2020 17:56    Anti-infectives: Anti-infectives (From admission, onward)   Start     Dose/Rate Route Frequency Ordered Stop   11/11/20 1400  ciprofloxacin (CIPRO) IVPB 400 mg        400 mg 200 mL/hr over 60 Minutes Intravenous Every 12 hours 11/11/20 1157      11/11/20 1400  metroNIDAZOLE (FLAGYL) tablet 500 mg        500 mg Oral Every 8 hours 11/11/20 1157     11/10/20 1445  cefOXitin (MEFOXIN) 2 g in sodium chloride 0.9 % 100 mL IVPB  Status:  Discontinued        2 g 200 mL/hr over 30 Minutes Intravenous Every 6 hours 11/10/20 1349 11/11/20 1157   11/10/20 1445  doxycycline (VIBRA-TABS) tablet 100 mg  Status:  Discontinued        100 mg Oral Every 12 hours 11/10/20 1349 11/11/20 1157       Assessment/Plan Recurrent pelvic abscess  ?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

## 2020-11-13 ENCOUNTER — Inpatient Hospital Stay (HOSPITAL_COMMUNITY): Payer: Medicaid Other

## 2020-11-13 ENCOUNTER — Encounter (HOSPITAL_COMMUNITY): Payer: Self-pay | Admitting: Obstetrics and Gynecology

## 2020-11-13 DIAGNOSIS — R1030 Lower abdominal pain, unspecified: Secondary | ICD-10-CM

## 2020-11-13 DIAGNOSIS — Z9889 Other specified postprocedural states: Secondary | ICD-10-CM

## 2020-11-13 MED ORDER — MIDAZOLAM HCL 2 MG/2ML IJ SOLN
INTRAMUSCULAR | Status: AC
  Filled 2020-11-13: qty 6

## 2020-11-13 MED ORDER — MIDAZOLAM HCL 2 MG/2ML IJ SOLN
INTRAMUSCULAR | Status: AC | PRN
  Administered 2020-11-13 (×3): 1 mg via INTRAVENOUS

## 2020-11-13 MED ORDER — SODIUM CHLORIDE 0.9% FLUSH
5.0000 mL | Freq: Three times a day (TID) | INTRAVENOUS | Status: DC
  Administered 2020-11-13 – 2020-11-18 (×15): 5 mL

## 2020-11-13 MED ORDER — FENTANYL CITRATE (PF) 100 MCG/2ML IJ SOLN
INTRAMUSCULAR | Status: AC | PRN
Start: 2020-11-13 — End: 2020-11-13
  Administered 2020-11-13 (×3): 50 ug via INTRAVENOUS

## 2020-11-13 MED ORDER — LIDOCAINE HCL 1 % IJ SOLN
INTRAMUSCULAR | Status: AC
  Filled 2020-11-13: qty 20

## 2020-11-13 MED ORDER — IOHEXOL 300 MG/ML  SOLN
100.0000 mL | Freq: Once | INTRAMUSCULAR | Status: AC | PRN
  Administered 2020-11-13: 100 mL via INTRAVENOUS

## 2020-11-13 MED ORDER — FENTANYL CITRATE (PF) 100 MCG/2ML IJ SOLN
INTRAMUSCULAR | Status: AC
  Filled 2020-11-13: qty 4

## 2020-11-13 NOTE — Procedures (Signed)
Interventional Radiology Procedure Note  Procedure: CT guided transgluteal left adnexal drain placement  Findings: Please refer to procedural dictation for full description.  Successful transgluteal approach left adnexal abscess drain placement, 10.2 Fr.  Approximately 30 mL purulent, green-tinged aspirate yield, sample sent for culture.  Complications: None immediate  Estimated Blood Loss: <5 mL  Recommendations: Keep to bulb suction. Follow up cultures. Ideal to keep both drains in place until definitive treatment plan/workup complete for possible Crohn's. IR will continue to follow.   Ruthann Cancer, MD Pager: [phone]

## 2020-11-13 NOTE — Plan of Care (Signed)

## 2020-11-13 NOTE — Progress Notes (Signed)
Progress Note     Subjective: Intermittent, sharp, cramping lower abdominal and lower back pain. Associated mild nausea. Denies nausea or vomiting. Denies flatus. Denies BM since admission - at baseline reports 2-3 loose BMs daily. Reports about 1 year of mild intermittent lower abdominal pain and a couple of weeks of severe, cramping pain. No reported fever, chills, CP, SOB, urinary sxs. Currently employed a [employer].   Objective: Vital signs in last 24 hours: Temp:  [98.1 F (36.7 C)-98.6 F (37 C)] 98.6 F (37 C) (11/27 0537) Pulse Rate:  [68-96] 68 (11/27 0537) Resp:  [13-18] 18 (11/27 0537) BP: (96-112)/(61-78) 97/63 (11/27 0537) SpO2:  [96 %-100 %] 100 % (11/27 0537) Last BM Date: 11/10/20  Intake/Output from previous day: 11/26 0701 - 11/27 0700 In: 10  Out: 200 [Drains:200] Intake/Output this shift: No intake/output data recorded.  PE: General: pleasant, WD, thin female who is laying in bed - intermittently grabs lower abd in pain HEENT: head is normocephalic, atraumatic.  Sclera are noninjected.  PERRL.  Ears and nose without any masses or lesions.  Mouth is pink and moist Heart: regular, rate, and rhythm.  Lungs: CTAB,   Respiratory effort nonlabored Abd: soft, NT, ND, +BS, no masses, hernias, or organomegaly, RLQ JP in place with serous drainage in gravity bag (200 cc/24h) MS: all 4 extremities are symmetrical with no cyanosis, clubbing, or edema. Skin: warm and dry with no masses, lesions, or rashes Neuro: Cranial nerves 2-12 grossly intact, no focal deficits  Psych: A&Ox3 with an appropriate affect.   Lab Results:  Recent Labs    11/10/20 1453  WBC 12.3*  HGB 10.0*  HCT 34.2*  PLT 699*   BMET No results for input(s): NA, K, CL, CO2, GLUCOSE, BUN, CREATININE, CALCIUM in the last 72 hours. PT/INR Recent Labs    11/10/20 1453  LABPROT 14.6  INR 1.2   CMP     Component Value Date/Time   NA 134 (L) 10/14/2020 1501   K 3.3 (L) 10/14/2020 1501    CL 98 10/14/2020 1501   CO2 26 10/14/2020 1501   GLUCOSE 92 10/14/2020 1501   BUN 10 10/14/2020 1501   CREATININE 0.72 10/14/2020 1501   CALCIUM 8.6 (L) 10/14/2020 1501   PROT 8.8 (H) 10/13/2020 0943   ALBUMIN 3.4 (L) 10/13/2020 0943   AST 16 10/13/2020 0943   ALT 11 10/13/2020 0943   ALKPHOS 65 10/13/2020 0943   BILITOT 0.2 (L) 10/13/2020 0943   GFRNONAA >60 10/14/2020 1501   Lipase     Component Value Date/Time   LIPASE 31 10/13/2020 0943       Studies/Results: CT IMAGE GUIDED DRAINAGE BY PERCUTANEOUS CATHETER  Result Date: 11/12/2020 INDICATION: 19 year old female with presumed tubo-ovarian abscess status post left adnexal fluid collection aspiration with sterile results on 10/19/2020. Presents with worsening pelvic pain and concern for tubo-ovarian abscess with possible visualization in the setting of Crohn's disease. EXAM: CT IMAGE GUIDED DRAINAGE BY PERCUTANEOUS CATHETER COMPARISON:  CT pelvis from 10/18/2020, CT-guided aspiration from 10/19/2020, ultrasound pelvis from 11/08/2020, and CT pelvis from 11/10/2020 MEDICATIONS: The patient is currently admitted to the hospital and receiving intravenous antibiotics. The antibiotics were administered within an appropriate time frame prior to the initiation of the procedure. ANESTHESIA/SEDATION: Moderate (conscious) sedation was employed during this procedure. A total of Versed 2 mg and Fentanyl 100 mcg was administered intravenously. Moderate Sedation Time: 31 minutes. The patient's level of consciousness and vital signs were monitored continuously by radiology nursing  throughout the procedure under my direct supervision. CONTRAST:  None COMPLICATIONS: None immediate. PROCEDURE: Informed written consent was obtained from the patient after a discussion of the risks, benefits and alternatives to treatment. The patient was placed supine on the CT gantry and a pre procedural CT was performed re-demonstrating the known abscess/fluid collection  within the right pelvis. The procedure was planned. A timeout was performed prior to the initiation of the procedure. The right anterior pelvis was prepped and draped in the usual sterile fashion. The overlying soft tissues were anesthetized with 1% lidocaine with epinephrine. Appropriate trajectory was planned with the use of a 22 gauge spinal needle. An 18 gauge trocar needle was advanced into the abscess/fluid collection and a short Amplatz super stiff wire was coiled within the collection. Appropriate positioning was confirmed with a limited CT scan. The tract was serially dilated allowing placement of a 10 [place] all-purpose drainage catheter. Appropriate positioning was confirmed with a limited postprocedural CT scan. Approximately 250 mL ml of translucent, straw-colored fluid was aspirated. The tube was connected to a drainage bag and sutured in place. A dressing was placed. The patient tolerated the procedure well without immediate post procedural complication. IMPRESSION: Successful CT guided placement of a 10 French all purpose drain catheter into the right pelvic fluid collection with aspiration of approximately 250 mL of serous fluid. Samples were sent for cytology as well as culture. PLAN: Obtain repeat CT pelvis with contrast in approximately 24 hours. If there is persistence of and safe window of approach for left adnexal fluid collection, additional percutaneous procedure could be considered. Ruthann Cancer, MD [facility] Electronically Signed   By: Ruthann Cancer MD   On: 11/12/2020 13:04    Anti-infectives: Anti-infectives (From admission, onward)   Start     Dose/Rate Route Frequency Ordered Stop   11/11/20 1400  ciprofloxacin (CIPRO) IVPB 400 mg        400 mg 200 mL/hr over 60 Minutes Intravenous Every 12 hours 11/11/20 1157     11/11/20 1400  metroNIDAZOLE (FLAGYL) tablet 500 mg        500 mg Oral Every 8 hours 11/11/20 1157      11/10/20 1445  cefOXitin (MEFOXIN) 2 g in sodium chloride 0.9 % 100 mL IVPB  Status:  Discontinued        2 g 200 mL/hr over 30 Minutes Intravenous Every 6 hours 11/10/20 1349 11/11/20 1157   11/10/20 1445  doxycycline (VIBRA-TABS) tablet 100 mg  Status:  Discontinued        100 mg Oral Every 12 hours 11/10/20 1349 11/11/20 1157       Assessment/Plan 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 Paulita Fujita no further GI workup while working through treatment of her abscess.  - no acute surgical intervention indicated at this time; above drain studies more consistent with cystic structure than abscess/infectious process.  - continue colace, miralax  FEN: Reg diet  VTE: SCDs ID: PO flagyl, IV cipro   LOS: 3 days    Jill Alexanders , [facility] 11/13/2020, 8:10 AM Please see Amion for pager number during day hours 7:00am-4:30pm

## 2020-11-13 NOTE — Progress Notes (Signed)
[facility] OB/[facility]N Attending Progress Note  Subjective:  Underwent IR drainage yesterday, 250 ml of serous fluid drainage. Drain still in place. Patient still reports moderate lower abdominal pain and that is sharp and intermittent.  No nausea or vomiting. No flatus or BM.   Admitted on 11/10/2020 for recurrent TOA (tubo-ovarian abscess).     Objective:  Blood pressure 97/63, pulse 68, temperature 98.6 F (37 C), temperature source Oral, resp. rate 18, height 5' 5"  (1.651 m), weight 51.4 kg, last menstrual period 11/06/2020, SpO2 100 %.   I/O last 3 completed shifts: In: 10 [Other:10] Out: 200 [Drains:200]  Gen: NAD HENT: Normocephalic, atraumatic Lungs: Normal respiratory effort Heart: Regular rate noted Abdomen: soft, marked tenderness in lower abdomen diffusely to palpation, no rebound or guarding, JP in place in RLQ attached to gravity bag Cervix: Deferred Ext: 2+ DTRs, no edema, no cyanosis, negative Homan's sign  Studies: CBC Latest Ref Rng & Units 11/10/2020 11/04/2020 11/03/2020  WBC 4.0 - 10.5 K/uL 12.3(H) 12.5(H) 12.7(H)  Hemoglobin 12.0 - 15.0 g/dL 10.0(L) 10.5(L) 11.6  Hematocrit 36 - 46 % 34.2(L) 35.4(L) 38.7  Platelets 150 - 400 K/uL 699(H) 734(H) 748(H)   CMP Latest Ref Rng & Units 10/14/2020 10/13/2020  Glucose 70 - 99 mg/dL 92 102(H)  BUN 6 - 20 mg/dL 10 16  Creatinine 0.44 - 1.00 mg/dL 0.72 0.86  Sodium 135 - 145 mmol/L 134(L) 136  Potassium 3.5 - 5.1 mmol/L 3.3(L) 3.3(L)  Chloride 98 - 111 mmol/L 98 100  CO2 22 - 32 mmol/L 26 25  Calcium 8.9 - 10.3 mg/dL 8.6(L) 9.1  Total Protein 6.5 - 8.1 g/dL - 8.8(H)  Total Bilirubin 0.3 - 1.2 mg/dL - 0.2(L)  Alkaline Phos 38 - 126 U/L - 65  AST 15 - 41 U/L - 16  ALT 0 - 44 U/L - 11   CT PELVIS W CONTRAST  Result Date: 11/13/2020 CLINICAL DATA:  History of tubo-ovarian abscess post right-sided percutaneous drainage catheter placement. EXAM: CT PELVIS WITH CONTRAST TECHNIQUE: Multidetector CT imaging of  the pelvis was performed using the standard protocol following the bolus administration of intravenous contrast. CONTRAST:  128m OMNIPAQUE IOHEXOL 300 MG/ML  SOLN COMPARISON:  CT abdomen pelvis-11/10/2020; 10/18/2020; CT guided aspiration of pelvic fluid collection via left trans gluteal approach-10/19/2020. CT guided right anterior pelvic percutaneous catheter placement-11/12/2020 FINDINGS: Urinary Tract: Normal appearance of the urinary bladder given degree distention. Bowel: There is ill-defined wall thickening and adjacent stranding involving multiple loops of distal small bowel (coronal images 16 through 46, series 5) with associated upstream distension of the small bowel. Redemonstrated potential fistulous connection between the terminal ileum and the inflammatory to process within the left adnexa (coronal image 45, series 5). Inflammatory change now involves the tip of the appendix (images 35, 40 and 42), nonspecific though presumably reactive in etiology. No discrete definable/drainable fluid collections within the abdomen or pelvis. Vascular/Lymphatic: Pelvic vasculature appears patent. Presumably reactive pelvic lymph nodes with index left common iliac chain lymph node measuring 1 cm greatest short axis diameter (image 9, series 3). Reproductive: Interval reduction/near resolution of previously noted dominant fluid collection within the midline of the pelvis following right anterior approach percutaneous drainage catheter placement. Unchanged appearance of small (approximately 2.5 x 2.0 cm) right adnexal fluid collection (image 23, series 3), though the drainage catheter appears to traverse this more superficial collection. Stable appearance of the approximately 3.4 x 2.8 cm left-sided adnexal collection (image 61, series 3), unchanged compared to the  11/10/2020 examination. No new fluid collections within the abdomen or pelvis. Other:  Regional soft tissues appear normal. Musculoskeletal: No acute or  aggressive osseous abnormalities. Normal appearance of the bilateral SI joints. IMPRESSION: 1. Redemonstrated extensive wall thickening and ill-defined stranding involving multiple loops of distal small bowel extending to the level of the pelvis,greater than would be expected for adjacent reactive inflammatory change, and while nonspecific could be seen in the setting of inflammatory bowel disease such as Crohn's colitis. Clinical correlation is advised. 2. Interval reduction/near resolution of previously noted dominant fluid collection within the midline of the pelvis following right anterior approach percutaneous drainage catheter placement. 3. Grossly unchanged small (approximately 2.5 cm) right adnexal fluid collection, though the drainage catheter appears to traverse this more superficial collection. 4. Stable appearance of approximately 3.4 cm left-sided adnexal collection, again with suspected fistulous connection to the adjacent distal small bowel, unchanged compared to the 11/10/2020 examination. PLAN: Patient now will be arranged to undergo attempted CT-guided aspiration and/or drainage catheter placement into the residual left adnexal collection Electronically Signed   By: Sandi Mariscal M.D.   On: 11/13/2020 12:22   CT PELVIS W CONTRAST  Result Date: 11/10/2020 CLINICAL DATA:  Tubo-ovarian abscess EXAM: CT PELVIS WITH CONTRAST TECHNIQUE: Multidetector CT imaging of the pelvis was performed using the standard protocol following the bolus administration of intravenous contrast. CONTRAST:  175m OMNIPAQUE IOHEXOL 300 MG/ML  SOLN COMPARISON:  10/18/2020 FINDINGS: Urinary Tract: Urinary bladder is under distended limiting assessment. Less distended than on the prior exam. No distal ureteral dilation. Bowel: Diffuse mural stratification and irregular thickening of small bowel loops in the pelvis. Complex fistulous network involving the distal ileum with tract extending from the distal ileum into the LEFT  adnexa where there is a peripherally enhancing fluid containing structure that is similar to the prior study. This area measures approximately 3.2 x 2.4 cm (image 19 of series 5. Displacing bowel loops in the pelvis is a 12 x 6.8 cm area of fluid density with peripheral septation that has enlarged since the prior study where it measured 10 x 6 cm. Pseudo sacculation along the distal ileum is present with transmural fistulae and overt fistulization to LEFT adnexa and with presumed Fischel ization also to the sigmoid colon, LEFT adnexal component best seen on image 67 of series 7. 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 Fistulization of LEFT adnexa can be followed on images 94-103 as well into the LEFT adnexa. Extensive stranding about the pelvis with similar appearance to prior imaging. No free air to exhibited in the lower abdomen/upper pelvis. Potential fistulization as well to the appendix which is at the same level of potentials fistulization to the sigmoid colon. Upstream bowel loops are dilated with similar pattern to previous imaging. Vascular/Lymphatic: Patent pelvic vasculature grossly. Venous structures not well assessed. No pelvic lymphadenopathy. Reproductive: Uterus displaced from fluid collection in the RIGHT pelvis. Other: Mesenteric stranding in the low abdomen. This finding is similar to the previous imaging study. Musculoskeletal: Subtle sclerosis in the subchondral region of the bilateral femoral heads raising the question of early avascular necrosis. IMPRESSION: 1. Complex fistulous network in the pelvis leading to abscess in the LEFT adnexa in the setting of presumed inflammatory bowel disease, most suggestive of Crohn's disease. Correlate with patient history for repeated episodes of abdominal pain. 2. Enlarging more simple appearing fluid in the pelvis in the RIGHT pelvis displacing structures from RIGHT to LEFT likely  a peritoneal inclusion cyst  in the setting of repeated inflammation based on the appearance of small bowel loops. This could also represent a hydrosalpinx. Correlate with fluid sampling which was performed on October 19, 2020 possibility of interval super infection is considered though the appearance while enlarged is otherwise similar to previous imaging. The 3. Upstream bowel loops are dilated likely related to partial obstruction but unchanged from previous exam. 4. Subtle sclerosis in the subchondral region of the bilateral femoral heads raising the question of early avascular necrosis. These results will be called to the ordering clinician or representative by the Radiologist Assistant, and communication documented in the PACS or [employer]. Electronically Signed   By: Zetta Bills M.D.   On: 11/10/2020 17:56   CT PELVIS W CONTRAST  Result Date: 10/18/2020 CLINICAL DATA:  Tubo-ovarian abscess EXAM: CT PELVIS WITH CONTRAST TECHNIQUE: Multidetector CT imaging of the pelvis was performed using the standard protocol following the bolus administration of intravenous contrast. CONTRAST:  147m OMNIPAQUE IOHEXOL 300 MG/ML  SOLN COMPARISON:  MR pelvis dated 10/17/2020. Ultrasound pelvis dated 10/14/2020. FINDINGS: Urinary Tract:  Bladder is within normal limits. Bowel: Long segment wall thickening involving the distal/terminal ileum (series 3/image 15), underdistended but abnormal. Vascular/Lymphatic: No evidence of aneurysm. No suspicious pelvic lymphadenopathy. Reproductive:  Uterus is within normal limits. Right ovary is likely within normal limits (series 3/image 20). 3.1 x 3.7 cm irregular enhancing lesion in the left ovary (series 3/image 20; sagittal image 92). Other: Small to moderate pelvic ascites, mildly masslike. Associated fluid/ascites along the small bowel mesentery (series 3/images 11 and 14). Musculoskeletal: Visualized osseous structures are within normal limits. IMPRESSION: 3.7 cm irregular enhancing lesion in the  left ovary, favoring a tubo-ovarian abscess on recent MRI. Long segment wall thickening involving the distal/terminal ileum, abnormal. This appearance may be reactive given the left adnexal process, but infectious/inflammatory ileitis is not excluded. At a minimum, GI follow-up after resolution of the pelvic inflammatory process is suggested. Small to moderate pelvic ascites, mildly masslike. Associated fluid/ascites along the small bowel mesentery. Electronically Signed   By: SJulian HyM.D.   On: 10/18/2020 13:35   MR PELVIS W WO CONTRAST  Result Date: 10/18/2020 CLINICAL DATA:  Pelvic mass on ultrasound EXAM: MRI PELVIS WITHOUT AND WITH CONTRAST TECHNIQUE: Multiplanar multisequence MR imaging of the pelvis was performed both before and after administration of intravenous contrast. CONTRAST:  559mGADAVIST GADOBUTROL 1 MMOL/ML IV SOLN COMPARISON:  Pelvic ultrasound dated 10/06/2020 FINDINGS: Urinary Tract:  Bladder is within normal limits. Bowel: Long segment wall thickening involving the distal/terminal ileum (series 5/images 10 and 13), nonspecific. Vascular/Lymphatic: No evidence of aneurysm. No suspicious pelvic lymphadenopathy. Reproductive:  Uterus is within normal limits. Right ovary is within normal limits. 3.8 x 3.8 x 2.9 cm peripherally enhancing left ovarian mass (series 3/image 16). No significant intrinsic T1 hyperintensity on precontrast imaging. As such, this appearance favors a tubo-ovarian abscess over a hemorrhagic corpus luteum. Other: Moderate pelvic ascites, partially loculated with mass effect in the dependent pelvis. Musculoskeletal: No focal osseous lesions. IMPRESSION: 3.8 cm left ovarian mass, favoring a small tubo-ovarian abscess. Long segment wall thickening involving the distal/terminal ileum, nonspecific and likely reactive given the left adnexal inflammatory process favored above. Associated moderate pelvic ascites, partially loculated with mass-effect in the dependent  pelvis. Electronically Signed   By: SrJulian Hy.D.   On: 10/18/2020 07:27   CT ASPIRATION  Result Date: 10/19/2020 CLINICAL DATA:  Pelvic fluid and possible left tubo-ovarian abscess.  EXAM: CT GUIDED ASPIRATION OF PELVIC FLUID COLLECTION ANESTHESIA/SEDATION: 2.0 mg IV Versed 75 mcg IV Fentanyl Total Moderate Sedation Time:  19 minutes The patient's level of consciousness and physiologic status were continuously monitored during the procedure by Radiology nursing. PROCEDURE: The procedure, risks, benefits, and alternatives were explained to the patient. Questions regarding the procedure were encouraged and answered. The patient understands and consents to the procedure. A time out was performed prior to initiating the procedure. CT of the pelvis was performed in a prone position. The left gluteal region was prepped with chlorhexidine in a sterile fashion, and a sterile drape was applied covering the operative field. A sterile gown and sterile gloves were used for the procedure. Local anesthesia was provided with 1% Lidocaine. From a posterior left transgluteal approach, a 5 [place] Yueh centesis catheter was advanced over a 19 gauge needle under CT guidance to the level of a posterior pelvic fluid collection. After confirming catheter position, aspiration was performed initially with a syringe for a sample which was sent for fluid cultures followed by vacuum bottle evacuation. The catheter was removed and additional CT performed. COMPLICATIONS: None FINDINGS: Aspiration at the level of the posterior pelvic fluid collection located in the cul-de-sac yielded clear, yellow fluid. A sample was sent for culture analysis. A total of 280 mL of fluid was able to be removed resulting in near complete decompression of the fluid collection by CT. There is no safe anterior or posterior percutaneous window to the level of the left ovary. IMPRESSION: CT-guided aspiration of pelvic fluid collection. A sample of clear,  yellow fluid was sent for culture analysis. A total of 280 mL of fluid was able to be removed resulting in near complete decompression of the fluid collection by CT. Electronically Signed   By: Aletta Edouard M.D.   On: 10/19/2020 13:40   US PELVIC DOPPLER (TORSION R/O OR MASS ARTERIAL FLOW)  Result Date: 10/14/2020 CLINICAL DATA:  Severe pelvic pain for 1 week. Last menstrual period 09/23/2020. EXAM: TRANSABDOMINAL ULTRASOUND OF PELVIS DOPPLER ULTRASOUND OF OVARIES TECHNIQUE: Transabdominal ultrasound examination of the pelvis was performed including evaluation of the uterus, ovaries, adnexal regions, and pelvic cul-de-sac. Color and duplex Doppler ultrasound was utilized to evaluate blood flow to the ovaries. COMPARISON:  None. FINDINGS: Measurements: 6.3 x 2.4 x 3.4 cm = volume: 27 mL. No fibroids or other mass visualized. Endometrium Thickness: 3 mm.  No focal abnormality visualized. Right ovary Measurements: 5.9 x 4.1 x 4.3 cm = volume: 54 mL. A right adnexal mass has multiple thin septations and measures 10.0 x 6.1 x 7.2 cm. Left ovary Measurements: 5.0 x 4.7 x 5.3 cm = volume: 65 mL. The left ovary has a heterogeneous appearance. Pulsed Doppler evaluation demonstrates normal low-resistance arterial and venous waveforms in both ovaries. Other findings: A heterogeneous soft tissue mass superior to the uterus measures 8.0 x 4.8 x 8.6 cm and demonstrates internal blood flow. Small volume free fluid is seen in the pelvis. IMPRESSION: 1. Enlarged and heterogeneous appearing left ovary may reflect infection or torsion. 2. Large cystic mass in the vicinity of the right adnexa may reflect hydrosalpinx, pyosalpinx, or an adnexal cyst with multiple thin septations. 3. Heterogeneous soft tissue mass superior to the uterus is nonspecific and may reflect a tubo-ovarian abscess. These results were called by telephone at the time of interpretation on 10/14/2020 at 2:12 pm to the patient's provider, who verbally  acknowledged these results. Electronically Signed   By: Harley Hallmark.D.  On: 10/14/2020 14:12   US PELVIC COMPLETE WITH TRANSVAGINAL  Result Date: 11/08/2020 CLINICAL DATA:  Recent tubo-ovarian abscess post drainage on 10/19/2020 and antibiotic therapy, continued pain; LMP 10/30/2020 EXAM: TRANSABDOMINAL AND TRANSVAGINAL ULTRASOUND OF PELVIS TECHNIQUE: Both transabdominal and transvaginal ultrasound examinations of the pelvis were performed. Transabdominal technique was performed for global imaging of the pelvis including uterus, ovaries, adnexal regions, and pelvic cul-de-sac. It was necessary to proceed with endovaginal exam following the transabdominal exam to visualize the uterus and ovaries. COMPARISON:  10/14/2020 Correlation: CT pelvis 10/18/2020, 10/19/2020 FINDINGS: Uterus Measurements: 6.6 x 2.6 x 2.9 cm = volume: 26 mL. Anteverted. Normal morphology without mass Endometrium Thickness: 5 mm.  No endometrial fluid or focal abnormality Right ovary Not definitely visualized, see below Left ovary Measurements: 3.9 x 3.0 x 4.0 cm = volume: 24.1 mL. Questionably visualized, potentially containing a small hemorrhagic corpus luteum 2.6 cm diameter. Other findings Large complex multiloculated fluid collection identified within the pelvis, in excess of 14 cm diameter, especially in cul-de-sac but extending LEFT and RIGHT in the pelvis. Scattered septations, wall irregularity, debris, and minimal nodularity seen. Finding is consistent with residual or recurrent abscess. It is difficult to distinguish RIGHT ovary and presence or absence of dilated fallopian tubes due to the large complex nature of the collection. IMPRESSION: Unremarkable uterus and endometrial complex. Large complex multiloculated fluid collection in the pelvis in excess of 14 cm diameter containing multiple thin septations, debris, and irregularity. It is uncertain whether this represents a complicated collection such as a tubo-ovarian  abscess or complicated pelvic ascites. Aspirate from prior drainage procedure was negative for organisms on gram stain and demonstrated no growth on culture. May consider further imaging such as by MR with and without contrast to further characterize, or may consider aspiration. Discussed with Dr. Elonda Husky on 11/08/2020 at 1530 hours. Electronically Signed   By: Lavonia Dana M.D.   On: 11/08/2020 17:51   CT IMAGE GUIDED DRAINAGE BY PERCUTANEOUS CATHETER  Result Date: 11/12/2020 INDICATION: 19 year old female with presumed tubo-ovarian abscess status post left adnexal fluid collection aspiration with sterile results on 10/19/2020. Presents with worsening pelvic pain and concern for tubo-ovarian abscess with possible visualization in the setting of Crohn's disease. EXAM: CT IMAGE GUIDED DRAINAGE BY PERCUTANEOUS CATHETER COMPARISON:  CT pelvis from 10/18/2020, CT-guided aspiration from 10/19/2020, ultrasound pelvis from 11/08/2020, and CT pelvis from 11/10/2020 MEDICATIONS: The patient is currently admitted to the hospital and receiving intravenous antibiotics. The antibiotics were administered within an appropriate time frame prior to the initiation of the procedure. ANESTHESIA/SEDATION: Moderate (conscious) sedation was employed during this procedure. A total of Versed 2 mg and Fentanyl 100 mcg was administered intravenously. Moderate Sedation Time: 31 minutes. The patient's level of consciousness and vital signs were monitored continuously by radiology nursing throughout the procedure under my direct supervision. CONTRAST:  None COMPLICATIONS: None immediate. PROCEDURE: Informed written consent was obtained from the patient after a discussion of the risks, benefits and alternatives to treatment. The patient was placed supine on the CT gantry and a pre procedural CT was performed re-demonstrating the known abscess/fluid collection within the right pelvis. The procedure was planned. A timeout was performed prior to  the initiation of the procedure. The right anterior pelvis was prepped and draped in the usual sterile fashion. The overlying soft tissues were anesthetized with 1% lidocaine with epinephrine. Appropriate trajectory was planned with the use of a 22 gauge spinal needle. An 18 gauge trocar needle was advanced into the abscess/fluid collection  and a short Amplatz super stiff wire was coiled within the collection. Appropriate positioning was confirmed with a limited CT scan. The tract was serially dilated allowing placement of a 10 [place] all-purpose drainage catheter. Appropriate positioning was confirmed with a limited postprocedural CT scan. Approximately 250 mL ml of translucent, straw-colored fluid was aspirated. The tube was connected to a drainage bag and sutured in place. A dressing was placed. The patient tolerated the procedure well without immediate post procedural complication. IMPRESSION: Successful CT guided placement of a 10 French all purpose drain catheter into the right pelvic fluid collection with aspiration of approximately 250 mL of serous fluid. Samples were sent for cytology as well as culture. PLAN: Obtain repeat CT pelvis with contrast in approximately 24 hours. If there is persistence of and safe window of approach for left adnexal fluid collection, additional percutaneous procedure could be considered. Ruthann Cancer, MD [facility] Radiology Electronically Signed   By: Ruthann Cancer MD   On: 11/12/2020 13:04    Assessment & Plan:  19 y.o. 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 Plan to try to drain L adnexal collection later today as per the CT scan read.  Continue drain care and follow recommendations by IR.  Appreciate their care of our patient.   - General Surgery following, appreciate recommendations by Jill Alexanders, PA-C and Dr. Lavone Neri. No need for urgent surgical intervention.  - GI following appreciate recommendations by Dr. Paulita Fujita. Continue Cipro/Flagyl on 11/11/20 given concern about bowel involvement and Crohn's.  No need for urgent colonoscopy at this point and patient reported undergoing colonoscopy in 10/2019 in [place] which was negative for Crohn's.  GI to follow up again on 11/29. - Analgesia as needed - Regular diet - Encourage OOB - Continue close observation. Plan was discussed with patient and her nursing team this afternoon.   Verita Schneiders, MD, [place] for [employer], [place]

## 2020-11-13 NOTE — Progress Notes (Signed)
Referring Physician(s): * No referring provider recorded for this case *  Supervising Physician: Dr. Serafina Royals  Patient Status:  [facility] Bell - In-pt  Chief Complaint: Intra-abdominal fluid collections  Subjective: Patient s/p anterior drain placement yesterday.  Reports softer abdomen today, pain remains.  RLQ drain with increased serous output.   Allergies: Patient has no known allergies.  Medications: Prior to Admission medications   Medication Sig Start Date End Date Taking? Authorizing Provider  doxycycline (VIBRA-TABS) 100 MG tablet Take 1 tablet (100 mg total) by mouth every 12 (twelve) hours. 11/03/20  Yes Griffin Basil, MD  ferrous sulfate (FERROUSUL) 325 (65 FE) MG tablet Take 1 tablet (325 mg total) by mouth 2 (two) times daily. 10/20/20  Yes Constant, Peggy, MD  ibuprofen (ADVIL) 600 MG tablet Take 1 tablet (600 mg total) by mouth every 6 (six) hours as needed for cramping. 10/20/20  Yes Constant, Peggy, MD  metroNIDAZOLE (FLAGYL) 500 MG tablet Take 1 tablet (500 mg total) by mouth every 12 (twelve) hours. 11/03/20  Yes Griffin Basil, MD  oxyCODONE-acetaminophen (PERCOCET/ROXICET) 5-325 MG tablet Take 1 tablet by mouth every 4 (four) hours as needed (moderate to severe pain (when tolerating fluids)). 10/20/20  Yes Constant, Peggy, MD     Vital Signs: BP 97/63 (BP Location: Left Arm)   Pulse 68   Temp 98.6 F (37 C) (Oral)   Resp 18   Ht 5' 5"  (1.651 m)   Wt 113 lb 5.1 oz (51.4 kg)   LMP 11/06/2020   SpO2 100%   BMI 18.86 kg/m   Physical Exam  NAD, alert, resting in bed Abdomen: soft, non-distended. RLQ drain in place. Insertion site c/d/i.  Serous output- 200 mL recorded overnight. Still with generalized abdominal pain.    Imaging: CT PELVIS W CONTRAST  Result Date: 11/13/2020 CLINICAL DATA:  History of tubo-ovarian abscess post right-sided percutaneous drainage catheter placement. EXAM: CT PELVIS WITH CONTRAST TECHNIQUE: Multidetector CT imaging of [facility] Bell  pelvis was performed using Rebecca Bell standard protocol following [facility] Bell bolus administration of intravenous contrast. CONTRAST:  181m OMNIPAQUE IOHEXOL 300 MG/ML  SOLN COMPARISON:  CT abdomen pelvis-11/10/2020; 10/18/2020; CT guided aspiration of pelvic fluid collection via left trans gluteal approach-10/19/2020. CT guided right anterior pelvic percutaneous catheter placement-11/12/2020 FINDINGS: Urinary Tract: Normal appearance of Rebecca Bell urinary bladder given degree distention. Bowel: There is ill-defined wall thickening and adjacent stranding involving multiple loops of distal small bowel (coronal images 16 through 46, series 5) with associated upstream distension of Rebecca Bell small bowel. Redemonstrated potential fistulous connection between Rebecca Bell terminal ileum and Rebecca Bell inflammatory to process within Rebecca Bell left adnexa (coronal image 45, series 5). Inflammatory change now involves Rebecca Bell tip of Rebecca Bell appendix (images 35, 40 and 42), nonspecific though presumably reactive in etiology. No discrete definable/drainable fluid collections within Rebecca Bell abdomen or pelvis. Vascular/Lymphatic: Pelvic vasculature appears patent. Presumably reactive pelvic lymph nodes with index left common iliac chain lymph node measuring 1 cm greatest short axis diameter (image 9, series 3). Reproductive: Interval reduction/near resolution of previously noted dominant fluid collection within Rebecca Bell midline of Rebecca Bell pelvis following right anterior approach percutaneous drainage catheter placement. Unchanged appearance of small (approximately 2.5 x 2.0 cm) right adnexal fluid collection (image 23, series 3), though Rebecca Bell drainage catheter appears to traverse this more superficial collection. Stable appearance of Rebecca Bell approximately 3.4 x 2.8 cm left-sided adnexal collection (image 61, series 3), unchanged compared to Rebecca Bell 11/10/2020 examination. No new fluid collections within Rebecca Bell abdomen or pelvis. Other:  Regional soft tissues  appear normal. Musculoskeletal: No acute or  aggressive osseous abnormalities. Normal appearance of Rebecca Bell bilateral SI joints. IMPRESSION: 1. Redemonstrated extensive wall thickening and ill-defined stranding involving multiple loops of distal small bowel extending to Rebecca Bell level of Rebecca Bell pelvis,greater than would be expected for adjacent reactive inflammatory change, and while nonspecific could be seen in Rebecca Bell setting of inflammatory bowel disease such as Crohn's colitis. Clinical correlation is advised. 2. Interval reduction/near resolution of previously noted dominant fluid collection within Rebecca Bell midline of Rebecca Bell pelvis following right anterior approach percutaneous drainage catheter placement. 3. Grossly unchanged small (approximately 2.5 cm) right adnexal fluid collection, though Rebecca Bell drainage catheter appears to traverse this more superficial collection. 4. Stable appearance of approximately 3.4 cm left-sided adnexal collection, again with suspected fistulous connection to Rebecca Bell adjacent distal small bowel, unchanged compared to Rebecca Bell 11/10/2020 examination. PLAN: Patient now will be arranged to undergo attempted CT-guided aspiration and/or drainage catheter placement into Rebecca Bell residual left adnexal collection Electronically Signed   By: Sandi Mariscal M.D.   On: 11/13/2020 12:22   CT PELVIS W CONTRAST  Result Date: 11/10/2020 CLINICAL DATA:  Tubo-ovarian abscess EXAM: CT PELVIS WITH CONTRAST TECHNIQUE: Multidetector CT imaging of Rebecca Bell pelvis was performed using Rebecca Bell standard protocol following Rebecca Bell bolus administration of intravenous contrast. CONTRAST:  133m OMNIPAQUE IOHEXOL 300 MG/ML  SOLN COMPARISON:  10/18/2020 FINDINGS: Urinary Tract: Urinary bladder is under distended limiting assessment. Less distended than on Rebecca Bell prior exam. No distal ureteral dilation. Bowel: Diffuse mural stratification and irregular thickening of small bowel loops in Rebecca Bell pelvis. Complex fistulous network involving Rebecca Bell distal ileum with tract extending from Rebecca Bell distal ileum into Rebecca Bell LEFT  adnexa where there is a peripherally enhancing fluid containing structure that is similar to Rebecca Bell prior study. This area measures approximately 3.2 x 2.4 cm (image 19 of series 5. Displacing bowel loops in Rebecca Bell pelvis is a 12 x 6.8 cm area of fluid density with peripheral septation that has enlarged since Rebecca Bell prior study where it measured 10 x 6 cm. Pseudo sacculation along Rebecca Bell distal ileum is present with transmural fistulae and overt fistulization to LEFT adnexa and with presumed Fischel ization also to Rebecca Bell sigmoid colon, LEFT adnexal component best seen on image 67 of series 7. 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 Fistulization of LEFT adnexa can be followed on images 94-103 as well into Rebecca Bell LEFT adnexa. Extensive stranding about Rebecca Bell pelvis with similar appearance to prior imaging. No free air to exhibited in Rebecca Bell lower abdomen/upper pelvis. Potential fistulization as well to Rebecca Bell appendix which is at Rebecca Bell same level of potentials fistulization to Rebecca Bell sigmoid colon. Upstream bowel loops are dilated with similar pattern to previous imaging. Vascular/Lymphatic: Patent pelvic vasculature grossly. Venous structures not well assessed. No pelvic lymphadenopathy. Reproductive: Uterus displaced from fluid collection in Rebecca Bell RIGHT pelvis. Other: Mesenteric stranding in Rebecca Bell low abdomen. This finding is similar to Rebecca Bell previous imaging study. Musculoskeletal: Subtle sclerosis in Rebecca Bell subchondral region of Rebecca Bell bilateral femoral heads raising Rebecca Bell question of early avascular necrosis. IMPRESSION: 1. 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 Correlate with patient history for repeated episodes of abdominal pain. 2. Enlarging more simple appearing fluid in Rebecca Bell pelvis in Rebecca Bell RIGHT pelvis displacing structures from RIGHT to LEFT likely a peritoneal inclusion cyst  in Rebecca Bell setting of repeated inflammation based on [facility] Bell appearance of  small bowel loops. This could also represent a hydrosalpinx. Correlate with fluid sampling which was performed on October 19, 2020 possibility of interval super infection is considered though [facility] Bell appearance while enlarged is otherwise similar to previous imaging. Rebecca Bell 3. Upstream bowel loops are dilated likely related to partial obstruction but unchanged from previous exam. 4. Subtle sclerosis in Rebecca Bell subchondral region of Rebecca Bell bilateral femoral heads raising Rebecca Bell question of early avascular necrosis. These results will be called to Rebecca Bell ordering clinician or representative by [facility] Bell Radiologist Assistant, and communication documented in [facility] PACS or [employer]. Electronically Signed   By: Zetta Bills M.D.   On: 11/10/2020 17:56   CT IMAGE GUIDED DRAINAGE BY PERCUTANEOUS CATHETER  Result Date: 11/12/2020 INDICATION: 19 year old female with presumed tubo-ovarian abscess status post left adnexal fluid collection aspiration with sterile results on 10/19/2020. Presents with worsening pelvic pain and concern for tubo-ovarian abscess with possible visualization in [facility] Bell setting of Crohn's disease. EXAM: CT IMAGE GUIDED DRAINAGE BY PERCUTANEOUS CATHETER COMPARISON:  CT pelvis from 10/18/2020, CT-guided aspiration from 10/19/2020, ultrasound pelvis from 11/08/2020, and CT pelvis from 11/10/2020 MEDICATIONS: [facility] patient is currently admitted to [facility] hospital and receiving intravenous antibiotics. Rebecca Bell antibiotics were administered within an appropriate time frame prior to Rebecca Bell initiation of [facility] Bell procedure. ANESTHESIA/SEDATION: Moderate (conscious) sedation was employed during this procedure. A total of Versed 2 mg and Fentanyl 100 mcg was administered intravenously. Moderate Sedation Time: 31 minutes. Rebecca Bell patient's level of consciousness and vital signs were monitored continuously by radiology nursing throughout Rebecca Bell procedure under my direct supervision.  CONTRAST:  None COMPLICATIONS: None immediate. PROCEDURE: Informed written consent was obtained from Rebecca Bell patient after a discussion of Rebecca Bell risks, benefits and alternatives to treatment. Rebecca Bell patient was placed supine on Rebecca Bell CT gantry and a pre procedural CT was performed re-demonstrating Rebecca Bell known abscess/fluid collection within Rebecca Bell right pelvis. Rebecca Bell procedure was planned. A timeout was performed prior to Rebecca Bell initiation of [facility] Bell procedure. Rebecca Bell right anterior pelvis was prepped and draped in Rebecca Bell usual sterile fashion. Rebecca Bell overlying soft tissues were anesthetized with 1% lidocaine with epinephrine. Appropriate trajectory was planned with Rebecca Bell use of a 22 gauge spinal needle. An 18 gauge trocar needle was advanced into Rebecca Bell abscess/fluid collection and a short Amplatz super stiff wire was coiled within Rebecca Bell collection. Appropriate positioning was confirmed with a limited CT scan. Rebecca Bell tract was serially dilated allowing placement of a 10 [place] all-purpose drainage catheter. Appropriate positioning was confirmed with a limited postprocedural CT scan. Approximately 250 mL ml of translucent, straw-colored fluid was aspirated. Rebecca Bell tube was connected to a drainage bag and sutured in place. A dressing was placed. Rebecca Bell patient tolerated Rebecca Bell procedure well without immediate post procedural complication. IMPRESSION: Successful CT guided placement of a 10 [place] all purpose drain catheter into Rebecca Bell right pelvic fluid collection with aspiration of approximately 250 mL of serous fluid. Samples were sent for cytology as well as culture. PLAN: Obtain repeat CT pelvis with contrast in approximately 24 hours. If there is persistence of and safe window of approach for left adnexal fluid collection, additional percutaneous procedure could be considered. Ruthann Cancer, MD [facility] Rad[facility] Electronically Signed   By: Ruthann Cancer MD   On: 11/12/2020 13:04    Labs:  CBC: Recent Labs     10/20/20 0036 11/03/20 1704 11/04/20 1432 11/10/20 1453  WBC 13.0* 12.7* 12.5* 12.3*  HGB 9.4* 11.6 10.5* 10.0*  HCT 31.3* 38.7 35.4* 34.2*  PLT 678* 748* 734*  699*    COAGS: Recent Labs    10/18/20 1445 11/10/20 1453  INR 1.1 1.2  APTT  --  38*    BMP: Recent Labs    10/13/20 0943 10/14/20 1501  NA 136 134*  K 3.3* 3.3*  CL 100 98  CO2 25 26  GLUCOSE 102* 92  BUN 16 10  CALCIUM 9.1 8.6*  CREATININE 0.86 0.72  GFRNONAA >60 >60    LIVER FUNCTION TESTS: Recent Labs    10/13/20 0943  BILITOT 0.2*  AST 16  ALT 11  ALKPHOS 65  PROT 8.8*  ALBUMIN 3.4*    Assessment and Plan: Intra-abdominal fluid collections, TOA Possible inflammatory bowel disease, work-up ongoing S/p RLQ anterior drain placement yesterday, 11/26 by Dr. Serafina Royals RLQ drain in place.  Culture NGTD. CT repeated this AM and reviewed by Dr. Pascal Lux and Dr. Serafina Royals.  Improvement noted to prior collection, now with ongoing collection at [facility] Bell suspected TOA. This collection is potentially accessible and amenable to drainage.  Discussed drainage with patient and medical team.  Plan to proceed today.  Patient last ate breakfast his morning around 8AM.  She has not resumed anticoagulation, no doses received in last several days.  Sister(s) again available for discussion via FaceTime.  Answered all questions.   Risks and benefits discussed with Rebecca Bell patient including bleeding, infection, damage to adjacent structures, bowel perforation/fistula connection, and sepsis.  All of Rebecca Bell patient's questions were answered, patient is agreeable to proceed. Consent signed and in chart.  Continue with current drain management to RLQ drain.  Continue cipro/flagyl per primary teams.    Electronically Signed: Docia Barrier, PA 11/13/2020, 1:08 PM   I spent a total of 25 Minutes at [facility] Bell Rebecca Bell patient's bedside AND on [facility] Bell patient's hospital floor or unit, greater than 50% of which was counseling/coordinating care  for intra-abdominal fluid collection, tubo-ovarian abscess.

## 2020-11-14 DIAGNOSIS — L0291 Cutaneous abscess, unspecified: Secondary | ICD-10-CM

## 2020-11-14 LAB — COMPREHENSIVE METABOLIC PANEL
ALT: 8 U/L (ref 0–44)
AST: 11 U/L — ABNORMAL LOW (ref 15–41)
Albumin: 2.6 g/dL — ABNORMAL LOW (ref 3.5–5.0)
Alkaline Phosphatase: 42 U/L (ref 38–126)
Anion gap: 10 (ref 5–15)
BUN: 5 mg/dL — ABNORMAL LOW (ref 6–20)
CO2: 28 mmol/L (ref 22–32)
Calcium: 9.1 mg/dL (ref 8.9–10.3)
Chloride: 95 mmol/L — ABNORMAL LOW (ref 98–111)
Creatinine, Ser: 0.88 mg/dL (ref 0.44–1.00)
GFR, Estimated: >60 mL/min (ref >60)
Glucose, Bld: 103 mg/dL — ABNORMAL HIGH (ref 70–99)
Potassium: 3.7 mmol/L (ref 3.5–5.1)
Sodium: 133 mmol/L — ABNORMAL LOW (ref 135–145)
Total Bilirubin: 0.5 mg/dL (ref 0.3–1.2)
Total Protein: 7.2 g/dL (ref 6.5–8.1)

## 2020-11-14 LAB — CBC WITH DIFFERENTIAL/PLATELET
Abs Immature Granulocytes: 0.04 10*3/uL (ref 0.00–0.07)
Basophils Absolute: 0.1 10*3/uL (ref 0.0–0.1)
Basophils Relative: 0 %
Eosinophils Absolute: 0.4 10*3/uL (ref 0.0–0.5)
Eosinophils Relative: 3 %
HCT: 33.1 % — ABNORMAL LOW (ref 36.0–46.0)
Hemoglobin: 9.8 g/dL — ABNORMAL LOW (ref 12.0–15.0)
Immature Granulocytes: 0 %
Lymphocytes Relative: 8 %
Lymphs Abs: 1.1 10*3/uL (ref 0.7–4.0)
MCH: 19 pg — ABNORMAL LOW (ref 26.0–34.0)
MCHC: 29.6 g/dL — ABNORMAL LOW (ref 30.0–36.0)
MCV: 64.3 fL — ABNORMAL LOW (ref 80.0–100.0)
Monocytes Absolute: 0.9 10*3/uL (ref 0.1–1.0)
Monocytes Relative: 7 %
Neutro Abs: 11 10*3/uL — ABNORMAL HIGH (ref 1.7–7.7)
Neutrophils Relative %: 82 %
Platelets: 671 10*3/uL — ABNORMAL HIGH (ref 150–400)
RBC: 5.15 MIL/uL — ABNORMAL HIGH (ref 3.87–5.11)
RDW: 26.3 % — ABNORMAL HIGH (ref 11.5–15.5)
WBC: 13.5 10*3/uL — ABNORMAL HIGH (ref 4.0–10.5)
nRBC: 0 % (ref 0.0–0.2)

## 2020-11-14 MED ORDER — HYDROMORPHONE HCL 2 MG PO TABS
2.0000 mg | ORAL_TABLET | ORAL | Status: DC | PRN
  Administered 2020-11-14 – 2020-11-15 (×4): 4 mg via ORAL
  Administered 2020-11-15 (×2): 2 mg via ORAL
  Administered 2020-11-16: 4 mg via ORAL
  Administered 2020-11-16: 2 mg via ORAL
  Administered 2020-11-16: 4 mg via ORAL
  Administered 2020-11-16 (×2): 2 mg via ORAL
  Administered 2020-11-17 – 2020-11-18 (×3): 4 mg via ORAL
  Filled 2020-11-14: qty 2
  Filled 2020-11-14: qty 1
  Filled 2020-11-14: qty 2
  Filled 2020-11-14 (×2): qty 1
  Filled 2020-11-14 (×3): qty 2
  Filled 2020-11-14: qty 1
  Filled 2020-11-14 (×3): qty 2
  Filled 2020-11-14: qty 1
  Filled 2020-11-14 (×2): qty 2

## 2020-11-14 NOTE — Progress Notes (Signed)
[facility]ulty [facility] OB/GYN Attending Progress Note  Subjective:  Underwent second IR drainage yesterday, 30 ml of green fluid drainage from L adnexal collection.  Transgluteal drain still in place. Patient still reports moderate lower abdominal pain and that is sharp and intermittent. 6/10 severity. Only alleviated by IV morphine. No nausea or vomiting. No flatus or BM. Not much appetite.     Objective:  Blood pressure 92/62, pulse 97, temperature 98.3 F (36.8 C), temperature source Oral, resp. rate 16, height 5' 5"  (1.651 m), weight 51.4 kg, last menstrual period 11/06/2020, SpO2 98 %.   I/O last 3 completed shifts: In: 395 [P.O.:360; Other:35] Out: 40 [Drains:58]  Gen: NAD HENT: Normocephalic, atraumatic Lungs: Normal respiratory effort Heart: Regular rate noted Abdomen: soft, moderate tenderness in lower abdomen diffusely to palpation, no rebound or guarding, drains in place in RLQ and transgluteal attached to gravity bag Cervix: Deferred Ext: 2+ DTRs, no edema, no cyanosis, negative Homan's sign  Studies: CBC Latest Ref Rng & Units 11/10/2020 11/04/2020 11/03/2020  WBC 4.0 - 10.5 K/uL 12.3(H) 12.5(H) 12.7(H)  Hemoglobin 12.0 - 15.0 g/dL 10.0(L) 10.5(L) 11.6  Hematocrit 36 - 46 % 34.2(L) 35.4(L) 38.7  Platelets 150 - 400 K/uL 699(H) 734(H) 748(H)   CMP Latest Ref Rng & Units 10/14/2020 10/13/2020  Glucose 70 - 99 mg/dL 92 102(H)  BUN 6 - 20 mg/dL 10 16  Creatinine 0.44 - 1.00 mg/dL 0.72 0.86  Sodium 135 - 145 mmol/L 134(L) 136  Potassium 3.5 - 5.1 mmol/L 3.3(L) 3.3(L)  Chloride 98 - 111 mmol/L 98 100  CO2 22 - 32 mmol/L 26 25  Calcium 8.9 - 10.3 mg/dL 8.6(L) 9.1  Total Protein 6.5 - 8.1 g/dL - 8.8(H)  Total Bilirubin 0.3 - 1.2 mg/dL - 0.2(L)  Alkaline Phos 38 - 126 U/L - 65  AST 15 - 41 U/L - 16  ALT 0 - 44 U/L - 11   CT PELVIS W CONTRAST  Result Date: 11/13/2020 CLINICAL DATA:  History of tubo-ovarian abscess post right-sided percutaneous drainage catheter  placement. EXAM: CT PELVIS WITH CONTRAST TECHNIQUE: Multidetector CT imaging of the pelvis was performed using the standard protocol following the bolus administration of intravenous contrast. CONTRAST:  175m OMNIPAQUE IOHEXOL 300 MG/ML  SOLN COMPARISON:  CT abdomen pelvis-11/10/2020; 10/18/2020; CT guided aspiration of pelvic fluid collection via left trans gluteal approach-10/19/2020. CT guided right anterior pelvic percutaneous catheter placement-11/12/2020 FINDINGS: Urinary Tract: Normal appearance of the urinary bladder given degree distention. Bowel: There is ill-defined wall thickening and adjacent stranding involving multiple loops of distal small bowel (coronal images 16 through 46, series 5) with associated upstream distension of the small bowel. Redemonstrated potential fistulous connection between the terminal ileum and the inflammatory to process within the left adnexa (coronal image 45, series 5). Inflammatory change now involves the tip of the appendix (images 35, 40 and 42), nonspecific though presumably reactive in etiology. No discrete definable/drainable fluid collections within the abdomen or pelvis. Vascular/Lymphatic: Pelvic vasculature appears patent. Presumably reactive pelvic lymph nodes with index left common iliac chain lymph node measuring 1 cm greatest short axis diameter (image 9, series 3). Reproductive: Interval reduction/near resolution of previously noted dominant fluid collection within the midline of the pelvis following right anterior approach percutaneous drainage catheter placement. Unchanged appearance of small (approximately 2.5 x 2.0 cm) right adnexal fluid collection (image 23, series 3), though the drainage catheter appears to traverse this more superficial collection. Stable appearance of the approximately 3.4 x 2.8 cm left-sided adnexal  collection (image 61, series 3), unchanged compared to the 11/10/2020 examination. No new fluid collections within the abdomen or  pelvis. Other:  Regional soft tissues appear normal. Musculoskeletal: No acute or aggressive osseous abnormalities. Normal appearance of the bilateral SI joints. IMPRESSION: 1. Redemonstrated extensive wall thickening and ill-defined stranding involving multiple loops of distal small bowel extending to the level of the pelvis,greater than would be expected for adjacent reactive inflammatory change, and while nonspecific could be seen in the setting of inflammatory bowel disease such as Crohn's colitis. Clinical correlation is advised. 2. Interval reduction/near resolution of previously noted dominant fluid collection within the midline of the pelvis following right anterior approach percutaneous drainage catheter placement. 3. Grossly unchanged small (approximately 2.5 cm) right adnexal fluid collection, though the drainage catheter appears to traverse this more superficial collection. 4. Stable appearance of approximately 3.4 cm left-sided adnexal collection, again with suspected fistulous connection to the adjacent distal small bowel, unchanged compared to the 11/10/2020 examination. PLAN: Patient now will be arranged to undergo attempted CT-guided aspiration and/or drainage catheter placement into the residual left adnexal collection Electronically Signed   By: Sandi Mariscal M.D.   On: 11/13/2020 12:22   CT PELVIS W CONTRAST  Result Date: 11/10/2020 CLINICAL DATA:  Tubo-ovarian abscess EXAM: CT PELVIS WITH CONTRAST TECHNIQUE: Multidetector CT imaging of the pelvis was performed using the standard protocol following the bolus administration of intravenous contrast. CONTRAST:  179m OMNIPAQUE IOHEXOL 300 MG/ML  SOLN COMPARISON:  10/18/2020 FINDINGS: Urinary Tract: Urinary bladder is under distended limiting assessment. Less distended than on the prior exam. No distal ureteral dilation. Bowel: Diffuse mural stratification and irregular thickening of small bowel loops in the pelvis. Complex fistulous network  involving the distal ileum with tract extending from the distal ileum into the LEFT adnexa where there is a peripherally enhancing fluid containing structure that is similar to the prior study. This area measures approximately 3.2 x 2.4 cm (image 19 of series 5. Displacing bowel loops in the pelvis is a 12 x 6.8 cm area of fluid density with peripheral septation that has enlarged since the prior study where it measured 10 x 6 cm. Pseudo sacculation along the distal ileum is present with transmural fistulae and overt fistulization to LEFT adnexa and with presumed Fischel ization also to the sigmoid colon, LEFT adnexal component best seen on image 67 of series 7. 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 Fistulization of LEFT adnexa can be followed on images 94-103 as well into the LEFT adnexa. Extensive stranding about the pelvis with similar appearance to prior imaging. No free air to exhibited in the lower abdomen/upper pelvis. Potential fistulization as well to the appendix which is at the same level of potentials fistulization to the sigmoid colon. Upstream bowel loops are dilated with similar pattern to previous imaging. Vascular/Lymphatic: Patent pelvic vasculature grossly. Venous structures not well assessed. No pelvic lymphadenopathy. Reproductive: Uterus displaced from fluid collection in the RIGHT pelvis. Other: Mesenteric stranding in the low abdomen. This finding is similar to the previous imaging study. Musculoskeletal: Subtle sclerosis in the subchondral region of the bilateral femoral heads raising the question of early avascular necrosis. IMPRESSION: 1. Complex fistulous network in the pelvis leading to abscess in the LEFT adnexa in the setting of presumed inflammatory bowel disease, most suggestive of Crohn's disease. Correlate with patient history for repeated episodes of abdominal pain. 2. Enlarging more simple appearing fluid in the pelvis in the  RIGHT  pelvis displacing structures from RIGHT to LEFT likely a peritoneal inclusion cyst in the setting of repeated inflammation based on the appearance of small bowel loops. This could also represent a hydrosalpinx. Correlate with fluid sampling which was performed on October 19, 2020 possibility of interval super infection is considered though the appearance while enlarged is otherwise similar to previous imaging. The 3. Upstream bowel loops are dilated likely related to partial obstruction but unchanged from previous exam. 4. Subtle sclerosis in the subchondral region of the bilateral femoral heads raising the question of early avascular necrosis. These results will be called to the ordering clinician or representative by the Radiologist Assistant, and communication documented in the PACS or [employer]. Electronically Signed   By: Zetta Bills M.D.   On: 11/10/2020 17:56   CT PELVIS W CONTRAST  Result Date: 10/18/2020 CLINICAL DATA:  Tubo-ovarian abscess EXAM: CT PELVIS WITH CONTRAST TECHNIQUE: Multidetector CT imaging of the pelvis was performed using the standard protocol following the bolus administration of intravenous contrast. CONTRAST:  118m OMNIPAQUE IOHEXOL 300 MG/ML  SOLN COMPARISON:  MR pelvis dated 10/17/2020. Ultrasound pelvis dated 10/14/2020. FINDINGS: Urinary Tract:  Bladder is within normal limits. Bowel: Long segment wall thickening involving the distal/terminal ileum (series 3/image 15), underdistended but abnormal. Vascular/Lymphatic: No evidence of aneurysm. No suspicious pelvic lymphadenopathy. Reproductive:  Uterus is within normal limits. Right ovary is likely within normal limits (series 3/image 20). 3.1 x 3.7 cm irregular enhancing lesion in the left ovary (series 3/image 20; sagittal image 92). Other: Small to moderate pelvic ascites, mildly masslike. Associated fluid/ascites along the small bowel mesentery (series 3/images 11 and 14). Musculoskeletal: Visualized osseous  structures are within normal limits. IMPRESSION: 3.7 cm irregular enhancing lesion in the left ovary, favoring a tubo-ovarian abscess on recent MRI. Long segment wall thickening involving the distal/terminal ileum, abnormal. This appearance may be reactive given the left adnexal process, but infectious/inflammatory ileitis is not excluded. At a minimum, GI follow-up after resolution of the pelvic inflammatory process is suggested. Small to moderate pelvic ascites, mildly masslike. Associated fluid/ascites along the small bowel mesentery. Electronically Signed   By: SJulian HyM.D.   On: 10/18/2020 13:35   MR PELVIS W WO CONTRAST  Result Date: 10/18/2020 CLINICAL DATA:  Pelvic mass on ultrasound EXAM: MRI PELVIS WITHOUT AND WITH CONTRAST TECHNIQUE: Multiplanar multisequence MR imaging of the pelvis was performed both before and after administration of intravenous contrast. CONTRAST:  527mGADAVIST GADOBUTROL 1 MMOL/ML IV SOLN COMPARISON:  Pelvic ultrasound dated 10/06/2020 FINDINGS: Urinary Tract:  Bladder is within normal limits. Bowel: Long segment wall thickening involving the distal/terminal ileum (series 5/images 10 and 13), nonspecific. Vascular/Lymphatic: No evidence of aneurysm. No suspicious pelvic lymphadenopathy. Reproductive:  Uterus is within normal limits. Right ovary is within normal limits. 3.8 x 3.8 x 2.9 cm peripherally enhancing left ovarian mass (series 3/image 16). No significant intrinsic T1 hyperintensity on precontrast imaging. As such, this appearance favors a tubo-ovarian abscess over a hemorrhagic corpus luteum. Other: Moderate pelvic ascites, partially loculated with mass effect in the dependent pelvis. Musculoskeletal: No focal osseous lesions. IMPRESSION: 3.8 cm left ovarian mass, favoring a small tubo-ovarian abscess. Long segment wall thickening involving the distal/terminal ileum, nonspecific and likely reactive given the left adnexal inflammatory process favored above.  Associated moderate pelvic ascites, partially loculated with mass-effect in the dependent pelvis. Electronically Signed   By: SrJulian Hy.D.   On: 10/18/2020 07:27   CT ASPIRATION  Result Date: 10/19/2020 CLINICAL  DATA:  Pelvic fluid and possible left tubo-ovarian abscess. EXAM: CT GUIDED ASPIRATION OF PELVIC FLUID COLLECTION ANESTHESIA/SEDATION: 2.0 mg IV Versed 75 mcg IV Fentanyl Total Moderate Sedation Time:  19 minutes The patient's level of consciousness and physiologic status were continuously monitored during the procedure by Radiology nursing. PROCEDURE: The procedure, risks, benefits, and alternatives were explained to the patient. Questions regarding the procedure were encouraged and answered. The patient understands and consents to the procedure. A time out was performed prior to initiating the procedure. CT of the pelvis was performed in a prone position. The left gluteal region was prepped with chlorhexidine in a sterile fashion, and a sterile drape was applied covering the operative field. A sterile gown and sterile gloves were used for the procedure. Local anesthesia was provided with 1% Lidocaine. From a posterior left transgluteal approach, a 5 [place] Yueh centesis catheter was advanced over a 19 gauge needle under CT guidance to the level of a posterior pelvic fluid collection. After confirming catheter position, aspiration was performed initially with a syringe for a sample which was sent for fluid cultures followed by vacuum bottle evacuation. The catheter was removed and additional CT performed. COMPLICATIONS: None FINDINGS: Aspiration at the level of the posterior pelvic fluid collection located in the cul-de-sac yielded clear, yellow fluid. A sample was sent for culture analysis. A total of 280 mL of fluid was able to be removed resulting in near complete decompression of the fluid collection by CT. There is no safe anterior or posterior percutaneous window to the level of the left  ovary. IMPRESSION: CT-guided aspiration of pelvic fluid collection. A sample of clear, yellow fluid was sent for culture analysis. A total of 280 mL of fluid was able to be removed resulting in near complete decompression of the fluid collection by CT. Electronically Signed   By: Aletta Edouard M.D.   On: 10/19/2020 13:40   US PELVIC COMPLETE WITH TRANSVAGINAL  Result Date: 11/08/2020 CLINICAL DATA:  Recent tubo-ovarian abscess post drainage on 10/19/2020 and antibiotic therapy, continued pain; LMP 10/30/2020 EXAM: TRANSABDOMINAL AND TRANSVAGINAL ULTRASOUND OF PELVIS TECHNIQUE: Both transabdominal and transvaginal ultrasound examinations of the pelvis were performed. Transabdominal technique was performed for global imaging of the pelvis including uterus, ovaries, adnexal regions, and pelvic cul-de-sac. It was necessary to proceed with endovaginal exam following the transabdominal exam to visualize the uterus and ovaries. COMPARISON:  10/14/2020 Correlation: CT pelvis 10/18/2020, 10/19/2020 FINDINGS: Uterus Measurements: 6.6 x 2.6 x 2.9 cm = volume: 26 mL. Anteverted. Normal morphology without mass Endometrium Thickness: 5 mm.  No endometrial fluid or focal abnormality Right ovary Not definitely visualized, see below Left ovary Measurements: 3.9 x 3.0 x 4.0 cm = volume: 24.1 mL. Questionably visualized, potentially containing a small hemorrhagic corpus luteum 2.6 cm diameter. Other findings Large complex multiloculated fluid collection identified within the pelvis, in excess of 14 cm diameter, especially in cul-de-sac but extending LEFT and RIGHT in the pelvis. Scattered septations, wall irregularity, debris, and minimal nodularity seen. Finding is consistent with residual or recurrent abscess. It is difficult to distinguish RIGHT ovary and presence or absence of dilated fallopian tubes due to the large complex nature of the collection. IMPRESSION: Unremarkable uterus and endometrial complex. Large complex  multiloculated fluid collection in the pelvis in excess of 14 cm diameter containing multiple thin septations, debris, and irregularity. It is uncertain whether this represents a complicated collection such as a tubo-ovarian abscess or complicated pelvic ascites. Aspirate from prior drainage procedure was negative for  organisms on gram stain and demonstrated no growth on culture. May consider further imaging such as by MR with and without contrast to further characterize, or may consider aspiration. Discussed with Dr. Elonda Husky on 11/08/2020 at 1530 hours. Electronically Signed   By: Lavonia Dana M.D.   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 Status post anterior right-sided pelvic drain and fluid evacuation on 11/12/2020. Presents for left tubo-ovarian abscess drainage after right-sided pelvic fluid evacuation. EXAM: CT IMAGE GUIDED FLUID DRAIN BY CATHETER COMPARISON:  CT abdomen pelvis from earlier the same day MEDICATIONS: The patient is currently admitted to the hospital and receiving intravenous antibiotics. The antibiotics were administered within an appropriate time frame prior to the initiation of the procedure. ANESTHESIA/SEDATION: Moderate (conscious) sedation was employed during this procedure. A total of Versed 3 mg and Fentanyl 150 mcg was administered intravenously. Moderate Sedation Time: 22 minutes. The patient's level of consciousness and vital signs were monitored continuously by radiology nursing throughout the procedure under my direct supervision. CONTRAST:  None COMPLICATIONS: None immediate. PROCEDURE: Informed written consent was obtained from the patient after a discussion of the risks, benefits and alternatives to treatment. The patient was placed prone in a semi jack-knife  position on the CT gantry and a pre procedural CT was performed re-demonstrating the known abscess/fluid collection within the left adnexa. The procedure was planned. A timeout was performed prior to the initiation of the procedure. The left superior buttock was prepped and draped in the usual sterile fashion. The overlying soft tissues were anesthetized with 1% lidocaine with epinephrine. Appropriate trajectory was planned with the use of a 22 gauge spinal needle. An 18 gauge trocar needle was advanced into the abscess/fluid collection and a short Amplatz super stiff wire was coiled within the collection. Appropriate positioning was confirmed with a limited CT scan. The tract was serially dilated allowing placement of a 10.2 [place] all-purpose drainage catheter. Appropriate positioning was confirmed with a limited postprocedural CT scan. Approximately 30 ml of purulent fluid was aspirated. The tube was connected to a drainage bag and sutured in place. A dressing was placed. The patient tolerated the procedure well without immediate post procedural complication. IMPRESSION: Successful CT guided placement of a 10.2 [place] all purpose drain catheter into the left adnexa with aspiration of 30 mL of purulent fluid. Samples were sent to the laboratory as requested by the ordering clinical team. Ruthann Cancer, MD [facility] Radiology Electronically Signed   By: Ruthann Cancer MD   On: 11/13/2020 19:29   CT IMAGE GUIDED DRAINAGE BY PERCUTANEOUS CATHETER  Result Date: 11/12/2020 INDICATION: 19 year old female with presumed tubo-ovarian abscess status post left adnexal fluid collection aspiration with sterile results on 10/19/2020. Presents with worsening pelvic pain and concern for tubo-ovarian abscess with possible visualization in the setting of Crohn's disease. EXAM: CT IMAGE GUIDED DRAINAGE BY PERCUTANEOUS CATHETER COMPARISON:  CT pelvis from 10/18/2020, CT-guided  aspiration from 10/19/2020, ultrasound pelvis from 11/08/2020, and CT pelvis from 11/10/2020 MEDICATIONS: The patient is currently admitted to the hospital and receiving intravenous antibiotics. The antibiotics were administered within an appropriate time frame prior to the initiation of the procedure. ANESTHESIA/SEDATION: Moderate (conscious) sedation was employed during this procedure. A total of Versed 2 mg and Fentanyl 100 mcg was administered intravenously. Moderate Sedation Time: 31  minutes. The patient's level of consciousness and vital signs were monitored continuously by radiology nursing throughout the procedure under my direct supervision. CONTRAST:  None COMPLICATIONS: None immediate. PROCEDURE: Informed written consent was obtained from the patient after a discussion of the risks, benefits and alternatives to treatment. The patient was placed supine on the CT gantry and a pre procedural CT was performed re-demonstrating the known abscess/fluid collection within the right pelvis. The procedure was planned. A timeout was performed prior to the initiation of the procedure. The right anterior pelvis was prepped and draped in the usual sterile fashion. The overlying soft tissues were anesthetized with 1% lidocaine with epinephrine. Appropriate trajectory was planned with the use of a 22 gauge spinal needle. An 18 gauge trocar needle was advanced into the abscess/fluid collection and a short Amplatz super stiff wire was coiled within the collection. Appropriate positioning was confirmed with a limited CT scan. The tract was serially dilated allowing placement of a 10 [place] all-purpose drainage catheter. Appropriate positioning was confirmed with a limited postprocedural CT scan. Approximately 250 mL ml of translucent, straw-colored fluid was aspirated. The tube was connected to a drainage bag and sutured in place. A dressing was placed. The patient tolerated the procedure well without immediate post  procedural complication. IMPRESSION: Successful CT guided placement of a 10 [place] all purpose drain catheter into the right pelvic fluid collection with aspiration of approximately 250 mL of serous fluid. Samples were sent for cytology as well as culture. PLAN: Obtain repeat CT pelvis with contrast in approximately 24 hours. If there is persistence of and safe window of approach for left adnexal fluid collection, additional percutaneous procedure could be considered. Ruthann Cancer, MD [facility] Radiology Electronically Signed   By: Ruthann Cancer MD   On: 11/12/2020 13:04    Assessment & Plan:  19 y.o. 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 Also underwent drainage on 11/27 of 30 ml green fluid from R adnexal collection, studies pending.   Continue drain care and follow recommendations by IR.  Appreciate their care of our patient.   - General Surgery following, appreciate their recommendations. No need for urgent surgical intervention.  - GI following appreciate recommendations by Dr. Paulita Fujita. Continue Cipro/Flagyl on 11/11/20 given concern about bowel involvement and Crohn's.  No need for urgent colonoscopy at this point and patient reported undergoing colonoscopy in 10/2019 in [place] which was negative for Crohn's.  GI to follow up again on 11/29. - Analgesia as needed. Patient encouraged to take oral medications in lieu of morphine, Dilaudid oral therapy ordered. IV Toradol/NSAIDs contraindicated given concern about Crohn's (verified with Pharmacy). - Regular diet as tolerated - Encourage OOB. SCDs for VTE prophylaxis. - Continue close observation. Plan was discussed with patient and her nursing team this morning.   Verita Schneiders, MD, [place] for  [employer], [place]

## 2020-11-15 ENCOUNTER — Inpatient Hospital Stay (HOSPITAL_COMMUNITY): Payer: Medicaid Other

## 2020-11-15 ENCOUNTER — Telehealth: Payer: Self-pay | Admitting: Lactation Services

## 2020-11-15 ENCOUNTER — Ambulatory Visit: Payer: Self-pay | Admitting: Obstetrics and Gynecology

## 2020-11-15 HISTORY — PX: IR SINUS/FIST TUBE CHK-NON GI: IMG673

## 2020-11-15 LAB — CYTOLOGY - NON PAP

## 2020-11-15 MED ORDER — IOHEXOL 300 MG/ML  SOLN
50.0000 mL | Freq: Once | INTRAMUSCULAR | Status: AC | PRN
  Administered 2020-11-15: 20 mL

## 2020-11-15 NOTE — Progress Notes (Signed)
Referring Physician(s): Dr. Vivien Rota  Supervising Physician: Daryll Brod  Patient Status:  [facility] - In-pt  Chief Complaint: Left tubo-ovarian abscess; pelvic fluid collections  Subjective: Patient ambulating in the room from just having brushed her teeth. She has moderate pain in the areas of her two drains. Both drain sites are clean and dry; easily flushed.   HPI: Rebecca Bell is a 19 y.o. female with past medical history of abdominal pain and ovarian cysts who presented to the ED 11/10/20 with recurrence of pelvic pain related to worsening TOA/pelvic fluid collection.  Patient known to IR from recent aspiration of pelvic fluid collection 10/19/20 with 280 mL fluid removed and resolution of collection. The culture from that fluid was negative for organism growth. She reported a few days of relief before slow, progressive return of her pain. She had a right pelvic fluid collection aspiration with drain placement on 11/12/20. She returned to IR again on 11/13/20 for a left adnexal fluid collection aspiration with drain placement (transgluteal approach).    Allergies: Patient has no known allergies.  Medications: Prior to Admission medications   Medication Sig Start Date End Date Taking? Authorizing Provider  doxycycline (VIBRA-TABS) 100 MG tablet Take 1 tablet (100 mg total) by mouth every 12 (twelve) hours. 11/03/20  Yes Griffin Basil, MD  ferrous sulfate (FERROUSUL) 325 (65 FE) MG tablet Take 1 tablet (325 mg total) by mouth 2 (two) times daily. 10/20/20  Yes Constant, Peggy, MD  ibuprofen (ADVIL) 600 MG tablet Take 1 tablet (600 mg total) by mouth every 6 (six) hours as needed for cramping. 10/20/20  Yes Constant, Peggy, MD  metroNIDAZOLE (FLAGYL) 500 MG tablet Take 1 tablet (500 mg total) by mouth every 12 (twelve) hours. 11/03/20  Yes Griffin Basil, MD  oxyCODONE-acetaminophen (PERCOCET/ROXICET) 5-325 MG tablet Take 1 tablet by mouth every 4 (four) hours as needed (moderate  to severe pain (when tolerating fluids)). 10/20/20  Yes Constant, Peggy, MD     Vital Signs: BP 110/68 (BP Location: Right Arm)    Pulse 99    Temp 99.1 F (37.3 C)    Resp 17    Ht 5' 5"  (1.651 m)    Wt 113 lb 5.1 oz (51.4 kg)    LMP 11/06/2020    SpO2 100%    BMI 18.86 kg/m   Physical Exam Constitutional:      General: She is not in acute distress. Pulmonary:     Effort: Pulmonary effort is normal.  Abdominal:     Tenderness: There is abdominal tenderness.     Comments: RLQ drain to gravity. Scant thin serosanguinous fluid in bag. Left transgluteal drain to JP bulb. Approximately 20 ml of dark serosanguineous fluid in bulb. Both drains easily flushed with 5 ml of NS. Dressings to both drains are clean, dry and intact.   Musculoskeletal:        General: Normal range of motion.  Skin:    General: Skin is warm and dry.  Neurological:     Mental Status: She is alert and oriented to person, place, and time.    Imaging: CT PELVIS W CONTRAST  Result Date: 11/13/2020 CLINICAL DATA:  History of tubo-ovarian abscess post right-sided percutaneous drainage catheter placement. EXAM: CT PELVIS WITH CONTRAST TECHNIQUE: Multidetector CT imaging of the pelvis was performed using the standard protocol following the bolus administration of intravenous contrast. CONTRAST:  184m OMNIPAQUE IOHEXOL 300 MG/ML  SOLN COMPARISON:  CT abdomen pelvis-11/10/2020; 10/18/2020; CT guided aspiration  of pelvic fluid collection via left trans gluteal approach-10/19/2020. CT guided right anterior pelvic percutaneous catheter placement-11/12/2020 FINDINGS: Urinary Tract: Normal appearance of the urinary bladder given degree distention. Bowel: There is ill-defined wall thickening and adjacent stranding involving multiple loops of distal small bowel (coronal images 16 through 46, series 5) with associated upstream distension of the small bowel. Redemonstrated potential fistulous connection between the terminal ileum and the  inflammatory to process within the left adnexa (coronal image 45, series 5). Inflammatory change now involves the tip of the appendix (images 35, 40 and 42), nonspecific though presumably reactive in etiology. No discrete definable/drainable fluid collections within the abdomen or pelvis. Vascular/Lymphatic: Pelvic vasculature appears patent. Presumably reactive pelvic lymph nodes with index left common iliac chain lymph node measuring 1 cm greatest short axis diameter (image 9, series 3). Reproductive: Interval reduction/near resolution of previously noted dominant fluid collection within the midline of the pelvis following right anterior approach percutaneous drainage catheter placement. Unchanged appearance of small (approximately 2.5 x 2.0 cm) right adnexal fluid collection (image 23, series 3), though the drainage catheter appears to traverse this more superficial collection. Stable appearance of the approximately 3.4 x 2.8 cm left-sided adnexal collection (image 61, series 3), unchanged compared to the 11/10/2020 examination. No new fluid collections within the abdomen or pelvis. Other:  Regional soft tissues appear normal. Musculoskeletal: No acute or aggressive osseous abnormalities. Normal appearance of the bilateral SI joints. IMPRESSION: 1. Redemonstrated extensive wall thickening and ill-defined stranding involving multiple loops of distal small bowel extending to the level of the pelvis,greater than would be expected for adjacent reactive inflammatory change, and while nonspecific could be seen in the setting of inflammatory bowel disease such as Crohn's colitis. Clinical correlation is advised. 2. Interval reduction/near resolution of previously noted dominant fluid collection within the midline of the pelvis following right anterior approach percutaneous drainage catheter placement. 3. Grossly unchanged small (approximately 2.5 cm) right adnexal fluid collection, though the drainage catheter appears  to traverse this more superficial collection. 4. Stable appearance of approximately 3.4 cm left-sided adnexal collection, again with suspected fistulous connection to the adjacent distal small bowel, unchanged compared to the 11/10/2020 examination. PLAN: Patient now will be arranged to undergo attempted CT-guided aspiration and/or drainage catheter placement into the residual left adnexal collection Electronically Signed   By: Sandi Mariscal M.D.   On: 11/13/2020 12:22   DG Abd Portable 1V  Result Date: 11/15/2020 CLINICAL DATA:  Nausea. Abdominal pain. History of tubo-ovarian abscesses. Patient has bilateral pelvic drains. EXAM: PORTABLE ABDOMEN - 1 VIEW COMPARISON:  Pelvic CT 11/13/2020 FINDINGS: Bilateral pigtail drains in the pelvis. Gas-filled structure in the central and right side of the pelvis is indeterminate but likely represents colon. Overall, nonobstructive bowel gas pattern. Evidence for a navel ring. IMPRESSION: 1. Gas-filled structure in the pelvis likely represents colon. Overall, nonobstructive bowel gas pattern. 2. Bilateral pelvic drains. Electronically Signed   By: Markus Daft M.D.   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 Status post anterior right-sided pelvic drain and fluid evacuation on 11/12/2020. Presents for left tubo-ovarian abscess drainage after right-sided pelvic fluid evacuation. EXAM: CT IMAGE GUIDED FLUID DRAIN BY CATHETER COMPARISON:  CT abdomen pelvis from earlier the same day MEDICATIONS: The patient is currently admitted to the hospital and receiving intravenous antibiotics. The  antibiotics were administered within an appropriate time frame prior to the initiation of the procedure. ANESTHESIA/SEDATION: Moderate (conscious) sedation was employed during this  procedure. A total of Versed 3 mg and Fentanyl 150 mcg was administered intravenously. Moderate Sedation Time: 22 minutes. The patient's level of consciousness and vital signs were monitored continuously by radiology nursing throughout the procedure under my direct supervision. CONTRAST:  None COMPLICATIONS: None immediate. PROCEDURE: Informed written consent was obtained from the patient after a discussion of the risks, benefits and alternatives to treatment. The patient was placed prone in a semi jack-knife position on the CT gantry and a pre procedural CT was performed re-demonstrating the known abscess/fluid collection within the left adnexa. The procedure was planned. A timeout was performed prior to the initiation of the procedure. The left superior buttock was prepped and draped in the usual sterile fashion. The overlying soft tissues were anesthetized with 1% lidocaine with epinephrine. Appropriate trajectory was planned with the use of a 22 gauge spinal needle. An 18 gauge trocar needle was advanced into the abscess/fluid collection and a short Amplatz super stiff wire was coiled within the collection. Appropriate positioning was confirmed with a limited CT scan. The tract was serially dilated allowing placement of a 10.2 [place] all-purpose drainage catheter. Appropriate positioning was confirmed with a limited postprocedural CT scan. Approximately 30 ml of purulent fluid was aspirated. The tube was connected to a drainage bag and sutured in place. A dressing was placed. The patient tolerated the procedure well without immediate post procedural complication. IMPRESSION: Successful CT guided placement of a 10.2 [place] all purpose drain catheter into the left adnexa with aspiration of 30 mL of purulent fluid. Samples were sent to the laboratory as requested by the ordering clinical team. Ruthann Cancer, MD [facility] Electronically Signed   By: Ruthann Cancer MD   On: 11/13/2020 19:29   CT IMAGE GUIDED DRAINAGE BY PERCUTANEOUS CATHETER  Result Date: 11/12/2020 INDICATION: 19 year old female with presumed tubo-ovarian abscess status post left adnexal fluid collection aspiration with sterile results on 10/19/2020. Presents with worsening pelvic pain and concern for tubo-ovarian abscess with possible visualization in the setting of Crohn's disease. EXAM: CT IMAGE GUIDED DRAINAGE BY PERCUTANEOUS CATHETER COMPARISON:  CT pelvis from 10/18/2020, CT-guided aspiration from 10/19/2020, ultrasound pelvis from 11/08/2020, and CT pelvis from 11/10/2020 MEDICATIONS: The patient is currently admitted to the hospital and receiving intravenous antibiotics. The antibiotics were administered within an appropriate time frame prior to the initiation of the procedure. ANESTHESIA/SEDATION: Moderate (conscious) sedation was employed during this procedure. A total of Versed 2 mg and Fentanyl 100 mcg was administered intravenously. Moderate Sedation Time: 31 minutes. The patient's level of consciousness and vital signs were monitored continuously by radiology nursing throughout the procedure under my direct supervision. CONTRAST:  None COMPLICATIONS: None immediate. PROCEDURE: Informed written consent was obtained from the patient after a discussion of the risks, benefits and alternatives to treatment. The patient was placed supine on the CT gantry and a pre procedural CT was performed re-demonstrating the known abscess/fluid collection within the right pelvis. The procedure was planned. A timeout was performed prior to the initiation of the procedure. The right anterior pelvis was prepped and draped in the usual sterile fashion. The overlying soft tissues were anesthetized with 1% lidocaine with epinephrine. Appropriate trajectory was planned with the use of a 22 gauge spinal needle. An 18 gauge trocar needle was advanced into the abscess/fluid collection and a short Amplatz super  stiff wire was coiled within the collection. Appropriate positioning was confirmed with a limited CT scan. The tract was serially dilated allowing placement of a 10 [place] all-purpose drainage catheter. Appropriate positioning was confirmed with a limited postprocedural CT scan. Approximately 250 mL ml of translucent, straw-colored fluid was aspirated. The tube was connected to a drainage bag and sutured in place. A dressing was placed. The patient tolerated the procedure well without immediate post procedural complication. IMPRESSION: Successful CT guided placement of a 10 [place] all purpose drain catheter into the right pelvic fluid collection with aspiration of approximately 250 mL of serous fluid. Samples were sent for cytology as well as culture. PLAN: Obtain repeat CT pelvis with contrast in approximately 24 hours. If there is persistence of and safe window of approach for left adnexal fluid collection, additional percutaneous procedure could be considered. Ruthann Cancer, MD [facility] Radiology Electronically Signed   By: Ruthann Cancer MD   On: 11/12/2020 13:04    Labs:  CBC: Recent Labs    11/03/20 1704 11/04/20 1432 11/10/20 1453 11/14/20 0811  WBC 12.7* 12.5* 12.3* 13.5*  HGB 11.6 10.5* 10.0* 9.8*  HCT 38.7 35.4* 34.2* 33.1*  PLT 748* 734* 699* 671*    COAGS: Recent Labs    10/18/20 1445 11/10/20 1453  INR 1.1 1.2  APTT  --  38*    BMP: Recent Labs    10/13/20 0943 10/14/20 1501 11/14/20 0811  NA 136 134* 133*  K 3.3* 3.3* 3.7  CL 100 98 95*  CO2 25 26 28   GLUCOSE 102* 92 103*  BUN 16 10 5*  CALCIUM 9.1 8.6* 9.1  CREATININE 0.86 0.72 0.88  GFRNONAA >60 >60 >60    LIVER FUNCTION TESTS: Recent Labs    10/13/20 0943 11/14/20 0811  BILITOT 0.2* 0.5  AST 16 11*  ALT 11 8  ALKPHOS 65 42  PROT 8.8* 7.2  ALBUMIN 3.4* 2.6*    Assessment and Plan:  Left tubo-ovarian abscess; left and right pelvic fluid  collections S/p anterior right lower quadrant drain and left transgluteal drain placements.   Left tubo-ovarian abscess aspirate with gram positive cocci on cultures. 22 ml of output documented in Epic and another 20 ml in JP bulb.   Right pelvic fluid collection with no growth on cultures. 55 ml of output documented in Epic; scant amount of fluid in gravity bag.   No plans for surgical intervention at this time. Request by surgical team for drain injections to evaluate for any fistulous connection to the bowel. Will plan for either today or tomorrow depending on IR schedule.    Please continue with current drain management: flush each drain each shift; document output; change dressings daily or as needed. Other plans per primary teams.   IR will continue to follow.   Electronically Signed: Soyla Dryer, AGACNP-BC [phone] 11/15/2020, 12:18 PM   I spent a total of 15 Minutes at the the patient's bedside AND on the patient's hospital floor or unit, greater than 50% of which was counseling/coordinating care for left and right pelvic fluid collection drains.

## 2020-11-15 NOTE — Progress Notes (Signed)
[facility] Progress Note  Rebecca Bell 19 y.o. 10/23/2001  CC: Tubo-ovarian abscess   Subjective: Patient reports continued abdominal pain, though states it has been improving somewhat.  Denies any nausea or vomiting.  Denies diarrhea, melena, hematochezia.  ROS : Review of Systems  Cardiovascular: Negative for chest pain and palpitations.  Gastrointestinal: Positive for abdominal pain. Negative for blood in stool, constipation, diarrhea, heartburn, melena, nausea and vomiting.      Objective: Vital signs in last 24 hours: Vitals:   11/15/20 1146 11/15/20 1324  BP: 110/68 100/70  Pulse: 99 (!) 104  Resp: 17 18  Temp: 99.1 F (37.3 C) 98.1 F (36.7 C)  SpO2: 100% 100%    Physical Exam:  General:   Lethargic, acutely ill-appearing, cooperative, no distress, appears stated age  Head:  Normocephalic, without obvious abnormality, atraumatic  Eyes:   Anicteric sclera, EOM's intact,   Lungs:   Clear to auscultation bilaterally, respirations unlabored  Heart:  Regular rate and rhythm, S1, S2 normal  Abdomen:   Soft, mild lower abdominal tenderness bowel sounds active all four quadrants,  no guarding or peritoneal signs, 2 drains in place  Extremities: Extremities normal, atraumatic, no  edema  Pulses: 2+ and symmetric    Lab Results: Recent Labs    11/14/20 0811  NA 133*  K 3.7  CL 95*  CO2 28  GLUCOSE 103*  BUN 5*  CREATININE 0.88  CALCIUM 9.1   Recent Labs    11/14/20 0811  AST 11*  ALT 8  ALKPHOS 42  BILITOT 0.5  PROT 7.2  ALBUMIN 2.6*   Recent Labs    11/14/20 0811  WBC 13.5*  NEUTROABS 11.0*  HGB 9.8*  HCT 33.1*  MCV 64.3*  PLT 671*   No results for input(s): LABPROT, INR in the last 72 hours.  Assessment/Plan: Tubo-ovarian abscess.  Patient without any symptoms or family history of Crohn's disease.  Reports normal EGD and colonoscopy in [place] in November 2020. Elevated ESR, likely from chronic inflammation related to  abscess.  Further work-up as per OB/Gyn.  Discussed with Dr. Watt Climes.  If patient undergoes laparoscopy at some point in time, this could further evaluate terminal ileum.  With reported negative colonoscopy, suspect this is reactive.  [facility] will sign off.  Please contact us if we can be of any further assistance during this hospital stay.  Salley Slaughter PA-C 11/15/2020, 1:45 PM  Contact #  [phone]

## 2020-11-15 NOTE — Progress Notes (Signed)
   11/15/20 1049  Assess: MEWS Score  Level of Consciousness Alert  Assess: MEWS Score  MEWS Temp 0  MEWS Systolic 1  MEWS Pulse 1  MEWS RR 0  MEWS LOC 0  MEWS Score 2  MEWS Score Color Yellow  Assess: if the MEWS score is Yellow or Red  Were vital signs taken at a resting state? Yes  Focused Assessment No change from prior assessment  Early Detection of Sepsis Score *See Row Information* Low  MEWS guidelines implemented *See Row Information* Yes  Treat  MEWS Interventions Administered prn meds/treatments  Pain Scale 0-10  Pain Score 8  Pain Type Surgical pain  Pain Location Abdomen  Pain Orientation Right;Lower  Pain Descriptors / Indicators Aching  Pain Frequency Intermittent  Take Vital Signs  Increase Vital Sign Frequency  Yellow: Q 2hr X 2 then Q 4hr X 2, if remains yellow, continue Q 4hrs  Escalate  MEWS: Escalate Yellow: discuss with charge nurse/RN and consider discussing with provider and RRT  Notify: Charge Nurse/RN  Name of Charge Nurse/RN Notified Luisa, RN  Date Charge Nurse/RN Notified 11/15/20  Time Charge Nurse/RN Notified 1100  Document  Patient Outcome Stabilized after interventions

## 2020-11-15 NOTE — Progress Notes (Signed)
Patient ID: Rebecca Bell, female   DOB: 24-Feb-2001, 19 y.o.   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 Her oral temperature is 98.4 F (36.9 C). Her blood pressure is 103/76 and her pulse is 95. Her respiration is 18 and oxygen saturation is 96%.   EXAMINATION: CONSTITUTIONAL: Well-developed, well-nourished female in no acute distress.  HENT:  Normocephalic, atraumatic, External right and left ear normal. Oropharynx is clear and moist EYES: Conjunctivae and EOM are normal. Pupils are equal, round, and reactive to light. No scleral icterus.  NECK: Normal range of motion, supple, no masses SKIN: Skin is warm and dry. No rash noted. Not diaphoretic. No erythema. No pallor. [place]: Alert and oriented to person, place, and time. No cranial nerve deficit noted. PSYCHIATRIC: Normal mood and affect. Normal behavior. Normal judgment and thought content. CARDIOVASCULAR: Normal heart rate noted RESPIRATORY: Effort and breath sounds normal, no problems with respiration noted MUSCULOSKELETAL: Normal range of motion. No edema and no pain with movement ABDOMEN: Soft, mild tenderness no guarding or rebound   Laboratory Reports: Results for orders placed or performed during the hospital encounter of 11/10/20 (from the past 72 hour(s))  Aerobic/Anaerobic Culture (surgical/deep wound)     Status: None (Preliminary result)   Collection Time: 11/12/20 12:06 PM   Specimen: Abscess  Result Value Ref Range   Specimen Description ABSCESS PELVIS RIGHT    Special Requests NONE    Gram Stain      RARE WBC PRESENT, PREDOMINANTLY MONONUCLEAR NO ORGANISMS  SEEN    Culture      NO GROWTH 3 DAYS NO ANAEROBES ISOLATED; CULTURE IN PROGRESS FOR 5 DAYS Performed at [facility], [address], [place], [place] [address]    Report Status PENDING   Aerobic/Anaerobic Culture (surgical/deep wound)     Status: None (Preliminary result)   Collection Time: 11/13/20  5:48 PM   Specimen: Wound; Abscess  Result Value Ref Range   Specimen Description WOUND    Special Requests lt ovary    Gram Stain      ABUNDANT WBC PRESENT, PREDOMINANTLY PMN RARE GRAM POSITIVE COCCI IN PAIRS    Culture      CULTURE REINCUBATED FOR BETTER GROWTH Performed at [facility], [place] [address], [place], [place] [address]    Report Status PENDING   CBC with Differential/Platelet     Status: Abnormal   Collection Time: 11/14/20  8:11 AM  Result Value Ref Range   WBC 13.5 (H) 4.0 - 10.5 K/uL   RBC 5.15 (H) 3.87 - 5.11 MIL/uL   Hemoglobin 9.8 (L) 12.0 - 15.0 g/dL   HCT 33.1 (L) 36 - 46 %   MCV 64.3 (L) 80.0 - 100.0 fL   MCH 19.0 (L) 26.0 - 34.0 pg   MCHC 29.6 (L) 30.0 - 36.0 g/dL   RDW 26.3 (H) 11.5 - 15.5 %   Platelets 671 (H) 150 - 400 K/uL    Comment: REPEATED TO VERIFY   nRBC 0.0 0.0 - 0.2 %   Neutrophils Relative % 82 %   Neutro Abs  11.0 (H) 1.7 - 7.7 K/uL   Lymphocytes Relative 8 %   Lymphs Abs 1.1 0.7 - 4.0 K/uL   Monocytes Relative 7 %   Monocytes Absolute 0.9 0.1 - 1.0 K/uL   Eosinophils Relative 3 %   Eosinophils Absolute 0.4 0.0 - 0.5 K/uL   Basophils Relative 0 %   Basophils Absolute 0.1 0.0 - 0.1 K/uL   Immature Granulocytes 0 %   Abs Immature Granulocytes 0.04 0.00 - 0.07 K/uL    Comment: Performed at [facility] [address] [place], [place] [address]  Comprehensive metabolic panel     Status: Abnormal   Collection Time: 11/14/20  8:11 AM  Result Value Ref Range   Sodium 133 (L) 135 - 145 mmol/L   Potassium 3.7 3.5 - 5.1 mmol/L   Chloride 95 (L) 98 - 111 mmol/L   CO2 28 22 - 32 mmol/L   Glucose, Bld 103 (H) 70 - 99  mg/dL    Comment: Glucose reference range applies only to samples taken after fasting for at least 8 hours.   BUN 5 (L) 6 - 20 mg/dL   Creatinine, Ser 0.88 0.44 - 1.00 mg/dL   Calcium 9.1 8.9 - 10.3 mg/dL   Total Protein 7.2 6.5 - 8.1 g/dL   Albumin 2.6 (L) 3.5 - 5.0 g/dL   AST 11 (L) 15 - 41 U/L   ALT 8 0 - 44 U/L   Alkaline Phosphatase 42 38 - 126 U/L   Total Bilirubin 0.5 0.3 - 1.2 mg/dL   GFR, Estimated >60 >60 mL/min    Comment: (NOTE) Calculated using the CKD-EPI Creatinine Equation (2021)    Anion gap 10 5 - 15    Comment: Performed at [facility], [place] [address], [place], [place] [address]   Radiology Reports: DG Abd Portable 1V  Result Date: 11/15/2020 CLINICAL DATA:  Nausea. Abdominal pain. History of tubo-ovarian abscesses. Patient has bilateral pelvic drains. EXAM: PORTABLE ABDOMEN - 1 VIEW COMPARISON:  Pelvic CT 11/13/2020 FINDINGS: Bilateral pigtail drains in the pelvis. Gas-filled structure in the central and right side of the pelvis is indeterminate but likely represents colon. Overall, nonobstructive bowel gas pattern. Evidence for a navel ring. IMPRESSION: 1. Gas-filled structure in the pelvis likely represents colon. Overall, nonobstructive bowel gas pattern. 2. Bilateral pelvic drains. Electronically Signed   By: Markus Daft M.D.   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 Status post anterior right-sided pelvic drain and fluid evacuation on 11/12/2020. Presents for left tubo-ovarian abscess drainage after right-sided pelvic fluid evacuation. EXAM: CT IMAGE GUIDED FLUID DRAIN BY CATHETER COMPARISON:  CT abdomen pelvis from earlier the same day MEDICATIONS: The patient is currently admitted to the hospital and receiving intravenous antibiotics. The  antibiotics were administered within an appropriate time frame prior to the initiation of the procedure. ANESTHESIA/SEDATION: Moderate (conscious) sedation was employed during this procedure. A total of Versed 3 mg and Fentanyl 150 mcg was administered intravenously. Moderate Sedation Time: 22 minutes. The patient's level of consciousness and vital signs were monitored continuously by radiology nursing throughout the procedure under my direct supervision. CONTRAST:  None COMPLICATIONS: None immediate. PROCEDURE: Informed written consent was obtained from the patient after a discussion of the risks, benefits and alternatives to treatment. The patient was placed prone in  a semi jack-knife position on the CT gantry and a pre procedural CT was performed re-demonstrating the known abscess/fluid collection within the left adnexa. The procedure was planned. A timeout was performed prior to the initiation of the procedure. The left superior buttock was prepped and draped in the usual sterile fashion. The overlying soft tissues were anesthetized with 1% lidocaine with epinephrine. Appropriate trajectory was planned with the use of a 22 gauge spinal needle. An 18 gauge trocar needle was advanced into the abscess/fluid collection and a short Amplatz super stiff wire was coiled within the collection. Appropriate positioning was confirmed with a limited CT scan. The tract was serially dilated allowing placement of a 10.2 [place] all-purpose drainage catheter. Appropriate positioning was confirmed with a limited postprocedural CT scan. Approximately 30 ml of purulent fluid was aspirated. The tube was connected to a drainage bag and sutured in place. A dressing was placed. The patient tolerated the procedure well without immediate post procedural complication. IMPRESSION: Successful CT guided placement of a 10.2 [place] all purpose drain catheter into the left adnexa with aspiration of 30 mL of purulent fluid. Samples were sent to  the laboratory as requested by the ordering clinical team. Ruthann Cancer, MD [facility] Electronically Signed   By: Ruthann Cancer MD   On: 11/13/2020 19:29    I personally reviewed Labs and Imaging Studies under Results section.    ASSESSMENT/PLAN: S/p IR drainage pelvic fluid collections etiology uncertain, further imaging with contrast may be needed to see if fistula is present DVT Prophylaxis:  Ambulation,SCD   Code Status: Full Disposition Plan: pending  Time spent: 20    LOS: 5 days   Woodroe Mode, MD [place], [facility], [place]

## 2020-11-15 NOTE — Telephone Encounter (Signed)
Called patient and she is currently in the hospital to be treated for the Pelvic abscess. She has no questions or concerns at this time.

## 2020-11-15 NOTE — Progress Notes (Addendum)
Progress Note     Subjective: States abd pain about the same. Having nausea that improves with zofran, no vomiting. Reports a loose BM yesterday.   Objective: Vital signs in last 24 hours: Temp:  [98.1 F (36.7 C)-98.8 F (37.1 C)] 98.1 F (36.7 C) (11/29 0410) Pulse Rate:  [86-101] 86 (11/29 0410) Resp:  [15-16] 15 (11/29 0410) BP: (91-103)/(66-73) 101/70 (11/29 0410) SpO2:  [100 %] 100 % (11/29 0410) Last BM Date: 11/14/20  Intake/Output from previous day: 11/28 0701 - 11/29 0700 In: 730 [P.O.:700; I.V.:20] Out: 47 [Drains:77] Intake/Output this shift: No intake/output data recorded.  PE: General: pleasant, WD, thin female who is laying in bed HEENT: head is normocephalic, atraumatic.  Sclera are noninjected.  PERRL.  Ears and nose without any masses or lesions.  Mouth is pink and moist Heart: regular, rate, and rhythm.  Lungs: CTAB,   Respiratory effort nonlabored Abd: soft, lower abdomen TTP without peritonitis, +BS, no masses, hernias, or organomegaly, RLQ JP in place with minimal serous drainage in gravity bag, transgluteal JP with small amt cloudy SS drainage. MS: all 4 extremities are symmetrical with no cyanosis, clubbing, or edema. Skin: warm and dry with no masses, lesions, or rashes Neuro: Cranial nerves 2-12 grossly intact, no focal deficits  Psych: A&Ox3 with an appropriate affect.   Lab Results:  Recent Labs    11/14/20 0811  WBC 13.5*  HGB 9.8*  HCT 33.1*  PLT 671*   BMET Recent Labs    11/14/20 0811  NA 133*  K 3.7  CL 95*  CO2 28  GLUCOSE 103*  BUN 5*  CREATININE 0.88  CALCIUM 9.1   PT/INR No results for input(s): LABPROT, INR in the last 72 hours. CMP     Component Value Date/Time   NA 133 (L) 11/14/2020 0811   K 3.7 11/14/2020 0811   CL 95 (L) 11/14/2020 0811   CO2 28 11/14/2020 0811   GLUCOSE 103 (H) 11/14/2020 0811   BUN 5 (L) 11/14/2020 0811   CREATININE 0.88 11/14/2020 0811   CALCIUM 9.1 11/14/2020 0811   PROT 7.2  11/14/2020 0811   ALBUMIN 2.6 (L) 11/14/2020 0811   AST 11 (L) 11/14/2020 0811   ALT 8 11/14/2020 0811   ALKPHOS 42 11/14/2020 0811   BILITOT 0.5 11/14/2020 0811   GFRNONAA >60 11/14/2020 0811   Lipase     Component Value Date/Time   LIPASE 31 10/13/2020 0943       Studies/Results: CT PELVIS W CONTRAST  Result Date: 11/13/2020 CLINICAL DATA:  History of tubo-ovarian abscess post right-sided percutaneous drainage catheter placement. EXAM: CT PELVIS WITH CONTRAST TECHNIQUE: Multidetector CT imaging of the pelvis was performed using the standard protocol following the bolus administration of intravenous contrast. CONTRAST:  148m OMNIPAQUE IOHEXOL 300 MG/ML  SOLN COMPARISON:  CT abdomen pelvis-11/10/2020; 10/18/2020; CT guided aspiration of pelvic fluid collection via left trans gluteal approach-10/19/2020. CT guided right anterior pelvic percutaneous catheter placement-11/12/2020 FINDINGS: Urinary Tract: Normal appearance of the urinary bladder given degree distention. Bowel: There is ill-defined wall thickening and adjacent stranding involving multiple loops of distal small bowel (coronal images 16 through 46, series 5) with associated upstream distension of the small bowel. Redemonstrated potential fistulous connection between the terminal ileum and the inflammatory to process within the left adnexa (coronal image 45, series 5). Inflammatory change now involves the tip of the appendix (images 35, 40 and 42), nonspecific though presumably reactive in etiology. No discrete definable/drainable fluid collections within the abdomen  or pelvis. Vascular/Lymphatic: Pelvic vasculature appears patent. Presumably reactive pelvic lymph nodes with index left common iliac chain lymph node measuring 1 cm greatest short axis diameter (image 9, series 3). Reproductive: Interval reduction/near resolution of previously noted dominant fluid collection within the midline of the pelvis following right anterior  approach percutaneous drainage catheter placement. Unchanged appearance of small (approximately 2.5 x 2.0 cm) right adnexal fluid collection (image 23, series 3), though the drainage catheter appears to traverse this more superficial collection. Stable appearance of the approximately 3.4 x 2.8 cm left-sided adnexal collection (image 61, series 3), unchanged compared to the 11/10/2020 examination. No new fluid collections within the abdomen or pelvis. Other:  Regional soft tissues appear normal. Musculoskeletal: No acute or aggressive osseous abnormalities. Normal appearance of the bilateral SI joints. IMPRESSION: 1. Redemonstrated extensive wall thickening and ill-defined stranding involving multiple loops of distal small bowel extending to the level of the pelvis,greater than would be expected for adjacent reactive inflammatory change, and while nonspecific could be seen in the setting of inflammatory bowel disease such as Crohn's colitis. Clinical correlation is advised. 2. Interval reduction/near resolution of previously noted dominant fluid collection within the midline of the pelvis following right anterior approach percutaneous drainage catheter placement. 3. Grossly unchanged small (approximately 2.5 cm) right adnexal fluid collection, though the drainage catheter appears to traverse this more superficial collection. 4. Stable appearance of approximately 3.4 cm left-sided adnexal collection, again with suspected fistulous connection to the adjacent distal small bowel, unchanged compared to the 11/10/2020 examination. PLAN: Patient now will be arranged to undergo attempted CT-guided aspiration and/or drainage catheter placement into the residual left adnexal collection Electronically Signed   By: Sandi Mariscal M.D.   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 Status post anterior right-sided pelvic drain and fluid evacuation on 11/12/2020. Presents for left tubo-ovarian abscess drainage after right-sided pelvic fluid evacuation. EXAM: CT IMAGE GUIDED FLUID DRAIN BY CATHETER COMPARISON:  CT abdomen pelvis from earlier the same day MEDICATIONS: The patient is currently admitted to the hospital and receiving intravenous antibiotics. The antibiotics were administered within an appropriate time frame prior to the initiation of the procedure. ANESTHESIA/SEDATION: Moderate (conscious) sedation was employed during this procedure. A total of Versed 3 mg and Fentanyl 150 mcg was administered intravenously. Moderate Sedation Time: 22 minutes. The patient's level of consciousness and vital signs were monitored continuously by radiology nursing throughout the procedure under my direct supervision. CONTRAST:  None COMPLICATIONS: None immediate. PROCEDURE: Informed written consent was obtained from the patient after a discussion of the risks, benefits and alternatives to treatment. The patient was placed prone in a semi jack-knife position on the CT gantry and a pre procedural CT was performed re-demonstrating the known abscess/fluid collection within the left adnexa. The procedure was planned. A timeout was performed prior to the initiation of the procedure. The left superior buttock was prepped and draped in the usual sterile fashion. The overlying soft tissues were anesthetized with 1% lidocaine with epinephrine. Appropriate trajectory was planned with the use of a 22 gauge spinal needle. An 18 gauge trocar needle was advanced into the abscess/fluid collection and a short Amplatz super stiff wire was coiled within the collection. Appropriate positioning was confirmed with a limited CT scan. The tract was serially dilated allowing placement of a 10.2  [place] all-purpose drainage catheter. Appropriate positioning was  confirmed with a limited postprocedural CT scan. Approximately 30 ml of purulent fluid was aspirated. The tube was connected to a drainage bag and sutured in place. A dressing was placed. The patient tolerated the procedure well without immediate post procedural complication. IMPRESSION: Successful CT guided placement of a 10.2 [place] all purpose drain catheter into the left adnexa with aspiration of 30 mL of purulent fluid. Samples were sent to the laboratory as requested by the ordering clinical team. Ruthann Cancer, MD [facility] Electronically Signed   By: Ruthann Cancer MD   On: 11/13/2020 19:29    Anti-infectives: Anti-infectives (From admission, onward)   Start     Dose/Rate Route Frequency Ordered Stop   11/11/20 1400  ciprofloxacin (CIPRO) IVPB 400 mg        400 mg 200 mL/hr over 60 Minutes Intravenous Every 12 hours 11/11/20 1157     11/11/20 1400  metroNIDAZOLE (FLAGYL) tablet 500 mg        500 mg Oral Every 8 hours 11/11/20 1157     11/10/20 1445  cefOXitin (MEFOXIN) 2 g in sodium chloride 0.9 % 100 mL IVPB  Status:  Discontinued        2 g 200 mL/hr over 30 Minutes Intravenous Every 6 hours 11/10/20 1349 11/11/20 1157   11/10/20 1445  doxycycline (VIBRA-TABS) tablet 100 mg  Status:  Discontinued        100 mg Oral Every 12 hours 11/10/20 1349 11/11/20 1157       Assessment/Plan Left tubo-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 - no acute surgical intervention indicated at this time; will likely need CT with PO contrast and/or drain injection study of recently placed left adnexal drain  to confirm or rule out the presence of a left adnexal to small-bowel fistula. Will discuss timing of this study with MD.  - continue IV abx  - continue colace, miralax  FEN: transition to soft/low fiber diet given nausea, check KUB to evaluate for pSBO/ileus VTE: SCDs ID: PO flagyl, IV cipro; WBC increased to 13.5 yesterday from 12.3. monitor. Given recent procedures and small bowel inflammation this is relatively stable but may need to adjust abx if continues to rise. Follow Cx data.    LOS: 5 days    [facility] 11/15/2020, 7:45 AM Please see Amion for pager number during day hours 7:00am-4:30pm

## 2020-11-15 NOTE — Telephone Encounter (Signed)
-----   Message from Griffin Basil, MD sent at 11/08/2020  8:57 PM EST ----- Regarding: pelvic abscess I received a message from a radiologist about this patient stating her abscess may be increasing in size.  Please contact this patient and see if she can be seen by an MD before the holiday to access how much pain she is having and her general well being.  She definitely needs an HIV test and possibly will need a reaspiration or surgery.

## 2020-11-16 ENCOUNTER — Encounter (HOSPITAL_COMMUNITY): Payer: Self-pay | Admitting: Obstetrics and Gynecology

## 2020-11-16 MED ORDER — SODIUM CHLORIDE 0.9 % IV SOLN
3.0000 g | Freq: Four times a day (QID) | INTRAVENOUS | Status: DC
  Administered 2020-11-16 – 2020-11-19 (×11): 3 g via INTRAVENOUS
  Filled 2020-11-16: qty 3
  Filled 2020-11-16 (×2): qty 8
  Filled 2020-11-16: qty 3
  Filled 2020-11-16 (×3): qty 8
  Filled 2020-11-16 (×2): qty 3
  Filled 2020-11-16 (×2): qty 8
  Filled 2020-11-16: qty 3
  Filled 2020-11-16: qty 8
  Filled 2020-11-16: qty 3

## 2020-11-16 NOTE — Progress Notes (Signed)
Referring Physician(s):  Dr. Vivien Rota  Supervising Physician: Aletta Edouard  Patient Status:  [facility] - In-pt  Chief Complaint: Left tubo-ovarian abscess; pelvic fluid collections  Subjective: Patient in bed awake but in the dark. She states she is tired and expresses some frustration over being in the hospital. She also states that flushing the left transgluteal drain causes her moderate pain, enough to require pain medication.   HPI: Rebecca Mahoneis a 19 y.o.femalewith past medical history of abdominal pain and ovarian cysts who presented to the ED 11/10/20 with recurrence of pelvic pain related to worsening TOA/pelvic fluid collection. Patient known to IR from recent aspiration of pelvic fluid collection 10/19/20 with 280 mL fluid removed and resolution of collection. The culture from that fluid was negative for organism growth. She reported a few days of relief before slow, progressive return of her pain. She had a right pelvic fluid collection aspiration with drain placement on 11/12/20. She returned to IR again on 11/13/20 for a left adnexal fluid collection aspiration with drain placement (transgluteal approach). Both drains were injected with contrast 11/15/20 with fluoroscopy imaging confirming proper position of both drains, residual abscess cavities and no clear communication to adjacent bowel or adnexa.   Allergies: Patient has no known allergies.  Medications: Prior to Admission medications   Medication Sig Start Date End Date Taking? Authorizing Provider  doxycycline (VIBRA-TABS) 100 MG tablet Take 1 tablet (100 mg total) by mouth every 12 (twelve) hours. 11/03/20  Yes Griffin Basil, MD  ferrous sulfate (FERROUSUL) 325 (65 FE) MG tablet Take 1 tablet (325 mg total) by mouth 2 (two) times daily. 10/20/20  Yes Constant, Peggy, MD  ibuprofen (ADVIL) 600 MG tablet Take 1 tablet (600 mg total) by mouth every 6 (six) hours as needed for cramping. 10/20/20  Yes Constant, Peggy,  MD  metroNIDAZOLE (FLAGYL) 500 MG tablet Take 1 tablet (500 mg total) by mouth every 12 (twelve) hours. 11/03/20  Yes Griffin Basil, MD  oxyCODONE-acetaminophen (PERCOCET/ROXICET) 5-325 MG tablet Take 1 tablet by mouth every 4 (four) hours as needed (moderate to severe pain (when tolerating fluids)). 10/20/20  Yes Constant, Peggy, MD     Vital Signs: BP 97/70   Pulse 83   Temp 97.8 F (36.6 C) (Oral)   Resp 15   Ht 5' 5"  (1.651 m)   Wt 113 lb 5.1 oz (51.4 kg)   LMP 11/06/2020   SpO2 99%   BMI 18.86 kg/m   Physical Exam Constitutional:      General: She is not in acute distress. Pulmonary:     Effort: Pulmonary effort is normal.  Abdominal:     Tenderness: There is abdominal tenderness.     Comments: RLQ drain to gravity. Scant thin serosanguinous fluid in bag. Left transgluteal drain to JP bulb. Approximately 15 ml of light serosanguineous fluid in bulb. Both drains easily flushed with 5 ml of NS. Dressings to both drains are clean, dry and intact.    Skin:    General: Skin is warm and dry.  Neurological:     Mental Status: She is alert and oriented to person, place, and time.     Imaging: CT PELVIS W CONTRAST  Result Date: 11/13/2020 CLINICAL DATA:  History of tubo-ovarian abscess post right-sided percutaneous drainage catheter placement. EXAM: CT PELVIS WITH CONTRAST TECHNIQUE: Multidetector CT imaging of the pelvis was performed using the standard protocol following the bolus administration of intravenous contrast. CONTRAST:  184m OMNIPAQUE IOHEXOL 300 MG/ML  SOLN COMPARISON:  CT abdomen pelvis-11/10/2020; 10/18/2020; CT guided aspiration of pelvic fluid collection via left trans gluteal approach-10/19/2020. CT guided right anterior pelvic percutaneous catheter placement-11/12/2020 FINDINGS: Urinary Tract: Normal appearance of the urinary bladder given degree distention. Bowel: There is ill-defined wall thickening and adjacent stranding involving multiple loops of distal  small bowel (coronal images 16 through 46, series 5) with associated upstream distension of the small bowel. Redemonstrated potential fistulous connection between the terminal ileum and the inflammatory to process within the left adnexa (coronal image 45, series 5). Inflammatory change now involves the tip of the appendix (images 35, 40 and 42), nonspecific though presumably reactive in etiology. No discrete definable/drainable fluid collections within the abdomen or pelvis. Vascular/Lymphatic: Pelvic vasculature appears patent. Presumably reactive pelvic lymph nodes with index left common iliac chain lymph node measuring 1 cm greatest short axis diameter (image 9, series 3). Reproductive: Interval reduction/near resolution of previously noted dominant fluid collection within the midline of the pelvis following right anterior approach percutaneous drainage catheter placement. Unchanged appearance of small (approximately 2.5 x 2.0 cm) right adnexal fluid collection (image 23, series 3), though the drainage catheter appears to traverse this more superficial collection. Stable appearance of the approximately 3.4 x 2.8 cm left-sided adnexal collection (image 61, series 3), unchanged compared to the 11/10/2020 examination. No new fluid collections within the abdomen or pelvis. Other:  Regional soft tissues appear normal. Musculoskeletal: No acute or aggressive osseous abnormalities. Normal appearance of the bilateral SI joints. IMPRESSION: 1. Redemonstrated extensive wall thickening and ill-defined stranding involving multiple loops of distal small bowel extending to the level of the pelvis,greater than would be expected for adjacent reactive inflammatory change, and while nonspecific could be seen in the setting of inflammatory bowel disease such as Crohn's colitis. Clinical correlation is advised. 2. Interval reduction/near resolution of previously noted dominant fluid collection within the midline of the pelvis  following right anterior approach percutaneous drainage catheter placement. 3. Grossly unchanged small (approximately 2.5 cm) right adnexal fluid collection, though the drainage catheter appears to traverse this more superficial collection. 4. Stable appearance of approximately 3.4 cm left-sided adnexal collection, again with suspected fistulous connection to the adjacent distal small bowel, unchanged compared to the 11/10/2020 examination. PLAN: Patient now will be arranged to undergo attempted CT-guided aspiration and/or drainage catheter placement into the residual left adnexal collection Electronically Signed   By: Sandi Mariscal M.D.   On: 11/13/2020 12:22   IR Sinus/Fist Tube Chk-Non GI  Result Date: 11/15/2020 INDICATION: Crohn's disease, left tubo-ovarian abscess, pelvic pain, status post 2 percutaneous drain EXAM: Fluoroscopic injection of both pelvic drains MEDICATIONS: The patient is currently admitted to the hospital and receiving intravenous antibiotics. The antibiotics were administered within an appropriate time frame prior to the initiation of the procedure. ANESTHESIA/SEDATION: Moderate Sedation Time:  None. The patient was continuously monitored during the procedure by the interventional radiology nurse under my direct supervision. COMPLICATIONS: None immediate. PROCEDURE: Informed written consent was obtained from the patient after a thorough discussion of the procedural risks, benefits and alternatives. All questions were addressed. Maximal Sterile Barrier Technique was utilized including caps, mask, sterile gowns, sterile gloves, sterile drape, hand hygiene and skin antiseptic. A timeout was performed prior to the initiation of the procedure. Anterior right pelvic drain injection: Contrast fills a partially collapsed dependent abscess cavity within the pelvis which correlates with the prior CT. No communication to adjacent bowel or adnexa. Left transgluteal pelvic drain injection: Stable drain  catheter position. Abscess cavity  collapsed. There are complex sinus tracks extending from the drain catheter site but without any clear communication or fistula to adjacent bowel. IMPRESSION: Stable drain catheter positions. Residual abscess cavities remain at both drain catheter sites. No clear communication to adjacent bowel or adnexa. Electronically Signed   By: Jerilynn Mages.  Shick M.D.   On: 11/15/2020 18:29   IR Sinus/Fist Tube Chk-Non GI  Result Date: 11/15/2020 INDICATION: Crohn's disease, left tubo-ovarian abscess, pelvic pain, status post 2 percutaneous drain EXAM: Fluoroscopic injection of both pelvic drains MEDICATIONS: The patient is currently admitted to the hospital and receiving intravenous antibiotics. The antibiotics were administered within an appropriate time frame prior to the initiation of the procedure. ANESTHESIA/SEDATION: Moderate Sedation Time:  None. The patient was continuously monitored during the procedure by the interventional radiology nurse under my direct supervision. COMPLICATIONS: None immediate. PROCEDURE: Informed written consent was obtained from the patient after a thorough discussion of the procedural risks, benefits and alternatives. All questions were addressed. Maximal Sterile Barrier Technique was utilized including caps, mask, sterile gowns, sterile gloves, sterile drape, hand hygiene and skin antiseptic. A timeout was performed prior to the initiation of the procedure. Anterior right pelvic drain injection: Contrast fills a partially collapsed dependent abscess cavity within the pelvis which correlates with the prior CT. No communication to adjacent bowel or adnexa. Left transgluteal pelvic drain injection: Stable drain catheter position. Abscess cavity collapsed. There are complex sinus tracks extending from the drain catheter site but without any clear communication or fistula to adjacent bowel. IMPRESSION: Stable drain catheter positions. Residual abscess cavities remain  at both drain catheter sites. No clear communication to adjacent bowel or adnexa. Electronically Signed   By: Jerilynn Mages.  Shick M.D.   On: 11/15/2020 18:29   DG Abd Portable 1V  Result Date: 11/15/2020 CLINICAL DATA:  Nausea. Abdominal pain. History of tubo-ovarian abscesses. Patient has bilateral pelvic drains. EXAM: PORTABLE ABDOMEN - 1 VIEW COMPARISON:  Pelvic CT 11/13/2020 FINDINGS: Bilateral pigtail drains in the pelvis. Gas-filled structure in the central and right side of the pelvis is indeterminate but likely represents colon. Overall, nonobstructive bowel gas pattern. Evidence for a navel ring. IMPRESSION: 1. Gas-filled structure in the pelvis likely represents colon. Overall, nonobstructive bowel gas pattern. 2. Bilateral pelvic drains. Electronically Signed   By: Markus Daft M.D.   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 Status post anterior right-sided pelvic drain and fluid evacuation on 11/12/2020. Presents for left tubo-ovarian abscess drainage after right-sided pelvic fluid evacuation. EXAM: CT IMAGE GUIDED FLUID DRAIN BY CATHETER COMPARISON:  CT abdomen pelvis from earlier the same day MEDICATIONS: The patient is currently admitted to the hospital and receiving intravenous antibiotics. The antibiotics were administered within an appropriate time frame prior to the initiation of the procedure. ANESTHESIA/SEDATION: Moderate (conscious) sedation was employed during this procedure. A total of Versed 3 mg and Fentanyl 150 mcg was administered intravenously. Moderate Sedation Time: 22 minutes. The patient's level of consciousness and vital signs were monitored continuously by radiology nursing throughout the procedure under my direct supervision. CONTRAST:  None COMPLICATIONS: None  immediate. PROCEDURE: Informed written consent was obtained from the patient after a discussion of the risks, benefits and alternatives to treatment. The patient was placed prone in a semi jack-knife position on the CT gantry and a pre procedural CT was  performed re-demonstrating the known abscess/fluid collection within the left adnexa. The procedure was planned. A timeout was performed prior to the initiation of the procedure. The left superior buttock was prepped and draped in the usual sterile fashion. The overlying soft tissues were anesthetized with 1% lidocaine with epinephrine. Appropriate trajectory was planned with the use of a 22 gauge spinal needle. An 18 gauge trocar needle was advanced into the abscess/fluid collection and a short Amplatz super stiff wire was coiled within the collection. Appropriate positioning was confirmed with a limited CT scan. The tract was serially dilated allowing placement of a 10.2 [place] all-purpose drainage catheter. Appropriate positioning was confirmed with a limited postprocedural CT scan. Approximately 30 ml of purulent fluid was aspirated. The tube was connected to a drainage bag and sutured in place. A dressing was placed. The patient tolerated the procedure well without immediate post procedural complication. IMPRESSION: Successful CT guided placement of a 10.2 [place] all purpose drain catheter into the left adnexa with aspiration of 30 mL of purulent fluid. Samples were sent to the laboratory as requested by the ordering clinical team. Ruthann Cancer, MD [facility] Electronically Signed   By: Ruthann Cancer MD   On: 11/13/2020 19:29    Labs:  CBC: Recent Labs    11/03/20 1704 11/04/20 1432 11/10/20 1453 11/14/20 0811  WBC 12.7* 12.5* 12.3* 13.5*  HGB 11.6 10.5* 10.0* 9.8*  HCT 38.7 35.4* 34.2* 33.1*  PLT 748* 734* 699* 671*    COAGS: Recent Labs    10/18/20 1445 11/10/20 1453  INR 1.1  1.2  APTT  --  38*    BMP: Recent Labs    10/13/20 0943 10/14/20 1501 11/14/20 0811  NA 136 134* 133*  K 3.3* 3.3* 3.7  CL 100 98 95*  CO2 25 26 28   GLUCOSE 102* 92 103*  BUN 16 10 5*  CALCIUM 9.1 8.6* 9.1  CREATININE 0.86 0.72 0.88  GFRNONAA >60 >60 >60    LIVER FUNCTION TESTS: Recent Labs    10/13/20 0943 11/14/20 0811  BILITOT 0.2* 0.5  AST 16 11*  ALT 11 8  ALKPHOS 65 42  PROT 8.8* 7.2  ALBUMIN 3.4* 2.6*    Assessment and Plan:  Left tubo-ovarian abscess; left and right pelvic fluid collections S/p anterior right lower quadrant drain and left transgluteal drain placements.   Left tubo-ovarian abscess aspirate with gram positive cocci on cultures. 5 ml of output documented in Epic and another 15 ml in JP bulb.   Right pelvic fluid collection with no growth on cultures. 10 ml of output documented in Epic; scant amount of thin, clear/light yellow fluid in gravity bag.   Both drains were injected with contrast 11/15/20 with fluoroscopy imaging confirming proper position of both drains, residual abscess cavities and no clear communication to adjacent bowel or adnexa.    Please continue with current drain management for RLQ drain: flush drain each shift; document output; change dressing daily or as needed. An order has been placed to flush the left transgluteal drain just once daily due to patient discomfort.   If output continues to stay minimal for the next day or two will consider further imaging versus drain removal.   Other plans per primary teams.   IR will continue to follow.   Electronically Signed: Soyla Dryer, AGACNP-BC [phone] 11/16/2020, 12:08 PM   I spent a total of 15 Minutes at the the patient's bedside AND on the patient's hospital floor  or unit, greater than 50% of which was counseling/coordinating care for left and right pelvic fluid collection drains.

## 2020-11-16 NOTE — Progress Notes (Signed)
Progress Note     Subjective: No new complaints. Tolerating PO. We discussed the plan for IR drain injection study today.  Objective: Vital signs in last 24 hours: Temp:  [97.1 F (36.2 C)-99.4 F (37.4 C)] 97.8 F (36.6 C) (11/30 0521) Pulse Rate:  [83-105] 83 (11/30 0521) Resp:  [15-18] 15 (11/30 0521) BP: (96-110)/(61-76) 97/70 (11/30 0521) SpO2:  [96 %-100 %] 99 % (11/30 0521) Last BM Date: 11/14/20  Intake/Output from previous day: 11/29 0701 - 11/30 0700 In: 695 [P.O.:690; I.V.:5] Out: 15 [Drains:15] Intake/Output this shift: No intake/output data recorded.  PE: General: pleasant, WD, thin female who is laying in bed HEENT: head is normocephalic, atraumatic.  Sclera are noninjected.  PERRL.  Ears and nose without any masses or lesions.  Mouth is pink and moist Heart: regular, rate, and rhythm.  Lungs: CTAB,   Respiratory effort nonlabored Abd: soft, lower abdomen TTP without peritonitis, +BS, no masses, hernias, or organomegaly, RLQ JP in place with minimal serous drainage in gravity bag, transgluteal JP with small amt serous drainage. MS: all 4 extremities are symmetrical with no cyanosis, clubbing, or edema. Skin: warm and dry with no masses, lesions, or rashes Neuro: Cranial nerves 2-12 grossly intact, no focal deficits  Psych: A&Ox3 with an appropriate affect.  Lab Results:  Recent Labs    11/14/20 0811  WBC 13.5*  HGB 9.8*  HCT 33.1*  PLT 671*   BMET Recent Labs    11/14/20 0811  NA 133*  K 3.7  CL 95*  CO2 28  GLUCOSE 103*  BUN 5*  CREATININE 0.88  CALCIUM 9.1   PT/INR No results for input(s): LABPROT, INR in the last 72 hours. CMP     Component Value Date/Time   NA 133 (L) 11/14/2020 0811   K 3.7 11/14/2020 0811   CL 95 (L) 11/14/2020 0811   CO2 28 11/14/2020 0811   GLUCOSE 103 (H) 11/14/2020 0811   BUN 5 (L) 11/14/2020 0811   CREATININE 0.88 11/14/2020 0811   CALCIUM 9.1 11/14/2020 0811   PROT 7.2 11/14/2020 0811   ALBUMIN 2.6  (L) 11/14/2020 0811   AST 11 (L) 11/14/2020 0811   ALT 8 11/14/2020 0811   ALKPHOS 42 11/14/2020 0811   BILITOT 0.5 11/14/2020 0811   GFRNONAA >60 11/14/2020 0811   Lipase     Component Value Date/Time   LIPASE 31 10/13/2020 0943       Studies/Results: IR Sinus/Fist Tube Chk-Non GI  Result Date: 11/15/2020 INDICATION: Crohn's disease, left tubo-ovarian abscess, pelvic pain, status post 2 percutaneous drain EXAM: Fluoroscopic injection of both pelvic drains MEDICATIONS: The patient is currently admitted to the hospital and receiving intravenous antibiotics. The antibiotics were administered within an appropriate time frame prior to the initiation of the procedure. ANESTHESIA/SEDATION: Moderate Sedation Time:  None. The patient was continuously monitored during the procedure by the interventional radiology nurse under my direct supervision. COMPLICATIONS: None immediate. PROCEDURE: Informed written consent was obtained from the patient after a thorough discussion of the procedural risks, benefits and alternatives. All questions were addressed. Maximal Sterile Barrier Technique was utilized including caps, mask, sterile gowns, sterile gloves, sterile drape, hand hygiene and skin antiseptic. A timeout was performed prior to the initiation of the procedure. Anterior right pelvic drain injection: Contrast fills a partially collapsed dependent abscess cavity within the pelvis which correlates with the prior CT. No communication to adjacent bowel or adnexa. Left transgluteal pelvic drain injection: Stable drain catheter position. Abscess cavity collapsed.  There are complex sinus tracks extending from the drain catheter site but without any clear communication or fistula to adjacent bowel. IMPRESSION: Stable drain catheter positions. Residual abscess cavities remain at both drain catheter sites. No clear communication to adjacent bowel or adnexa. Electronically Signed   By: Jerilynn Mages.  Shick M.D.   On: 11/15/2020  18:29   IR Sinus/Fist Tube Chk-Non GI  Result Date: 11/15/2020 INDICATION: Crohn's disease, left tubo-ovarian abscess, pelvic pain, status post 2 percutaneous drain EXAM: Fluoroscopic injection of both pelvic drains MEDICATIONS: The patient is currently admitted to the hospital and receiving intravenous antibiotics. The antibiotics were administered within an appropriate time frame prior to the initiation of the procedure. ANESTHESIA/SEDATION: Moderate Sedation Time:  None. The patient was continuously monitored during the procedure by the interventional radiology nurse under my direct supervision. COMPLICATIONS: None immediate. PROCEDURE: Informed written consent was obtained from the patient after a thorough discussion of the procedural risks, benefits and alternatives. All questions were addressed. Maximal Sterile Barrier Technique was utilized including caps, mask, sterile gowns, sterile gloves, sterile drape, hand hygiene and skin antiseptic. A timeout was performed prior to the initiation of the procedure. Anterior right pelvic drain injection: Contrast fills a partially collapsed dependent abscess cavity within the pelvis which correlates with the prior CT. No communication to adjacent bowel or adnexa. Left transgluteal pelvic drain injection: Stable drain catheter position. Abscess cavity collapsed. There are complex sinus tracks extending from the drain catheter site but without any clear communication or fistula to adjacent bowel. IMPRESSION: Stable drain catheter positions. Residual abscess cavities remain at both drain catheter sites. No clear communication to adjacent bowel or adnexa. Electronically Signed   By: Jerilynn Mages.  Shick M.D.   On: 11/15/2020 18:29   DG Abd Portable 1V  Result Date: 11/15/2020 CLINICAL DATA:  Nausea. Abdominal pain. History of tubo-ovarian abscesses. Patient has bilateral pelvic drains. EXAM: PORTABLE ABDOMEN - 1 VIEW COMPARISON:  Pelvic CT 11/13/2020 FINDINGS: Bilateral  pigtail drains in the pelvis. Gas-filled structure in the central and right side of the pelvis is indeterminate but likely represents colon. Overall, nonobstructive bowel gas pattern. Evidence for a navel ring. IMPRESSION: 1. Gas-filled structure in the pelvis likely represents colon. Overall, nonobstructive bowel gas pattern. 2. Bilateral pelvic drains. Electronically Signed   By: Markus Daft M.D.   On: 11/15/2020 08:52    Anti-infectives: Anti-infectives (From admission, onward)   Start     Dose/Rate Route Frequency Ordered Stop   11/11/20 1400  ciprofloxacin (CIPRO) IVPB 400 mg        400 mg 200 mL/hr over 60 Minutes Intravenous Every 12 hours 11/11/20 1157     11/11/20 1400  metroNIDAZOLE (FLAGYL) tablet 500 mg        500 mg Oral Every 8 hours 11/11/20 1157     11/10/20 1445  cefOXitin (MEFOXIN) 2 g in sodium chloride 0.9 % 100 mL IVPB  Status:  Discontinued        2 g 200 mL/hr over 30 Minutes Intravenous Every 6 hours 11/10/20 1349 11/11/20 1157   11/10/20 1445  doxycycline (VIBRA-TABS) tablet 100 mg  Status:  Discontinued        100 mg Oral Every 12 hours 11/10/20 1349 11/11/20 1157       Assessment/Plan Left tubo-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 - IR drain injection study today to evaluate for possible enteroadnexal fistula - continue IV abx, transition to PO based on culture sensitivities  - continue colace, miralax PRN  FEN: soft/low fiber diet  VTE: SCDs ID: PO flagyl, IV cipro Dispo: med-surg, drain injection study today to r/o enteroadnexal fistula   LOS: 6 days    Jill Alexanders , [facility] Surgery 11/16/2020, 9:20 AM Please see Amion for pager number during day hours 7:00am-4:30pm

## 2020-11-16 NOTE — Progress Notes (Addendum)
Pt is currently on cipro and flagyl for her TOA infection. Abscess culture has grown out strep anginosis. It's sens to levofloxacin but cipro is not reliable for strep. D/w Dr Harmon Pier and we will optimize the cipro/flagyl to Unasyn. It can be transition to PO augmentin when appropriate.  Augmenting 3g IV q6  Onnie Boer, PharmD, [place], AAHIVP, CPP Infectious Disease Pharmacist 11/16/2020 1:25 PM

## 2020-11-16 NOTE — Final Consult Note (Signed)
Consultant Final Sign-Off Note    Assessment/Final recommendations  Rebecca Bell is a 19 y.o. female followed by me for pelvic fluid collection and recurrent tubo-ovarian abscess (TOA) with possible fistulous communication to the distal small bowel. Has now undergone percutaneous drainage of a serous pelvic collection (11/26, cultures NGTD) and left TOA (cx w/ streptococcus anginosis) as well as an IR injection study that does not demonstrate any fistulous communication between the TOA and the small bowel. GI has also been following and feels inflammatory bowel disease in unlikely given lack of family history and recent negative upper and lower endoscopies. This patient has no acute surgical needs, management of drains and antibiotics per primary team an interventional radiology. Please call as needed.  Wound care (if applicable): N/A   Diet at discharge: per primary team   Activity at discharge: per primary team   Follow-up appointment:  No general surgery follow up required    Pending results:  Unresulted Labs (From admission, onward)         None       Medication recommendations:   Other recommendations:    Thank you for allowing [place] to participate in the care of your patient!  Please consult [place] again if you have further needs for your patient.  Darci Current Lavelle Akel 11/16/2020 1:33 PM    Subjective   NAEO.  Objective  Vital signs in last 24 hours: Temp:  [97.8 F (36.6 C)-99.4 F (37.4 C)] 97.8 F (36.6 C) (11/30 0521) Pulse Rate:  [83-105] 83 (11/30 0521) Resp:  [15-18] 15 (11/30 0521) BP: (97-102)/(67-73) 97/70 (11/30 0521) SpO2:  [99 %-100 %] 99 % (11/30 0521)   Pertinent labs and Studies: Recent Labs    11/14/20 0811  WBC 13.5*  HGB 9.8*  HCT 33.1*   BMET Recent Labs    11/14/20 0811  NA 133*  K 3.7  CL 95*  CO2 28  GLUCOSE 103*  BUN 5*  CREATININE 0.88  CALCIUM 9.1   No results for input(s): LABURIN in the last 72 hours. Results for  orders placed or performed during the hospital encounter of 11/10/20  Resp Panel by RT-PCR (Flu A&B, Covid) Nasopharyngeal Swab     Status: None   Collection Time: 11/10/20  4:14 PM   Specimen: Nasopharyngeal Swab; Nasopharyngeal(NP) swabs in vial transport medium  Result Value Ref Range Status   SARS Coronavirus 2 by RT PCR NEGATIVE NEGATIVE Final    Comment: (NOTE) SARS-CoV-2 target nucleic acids are NOT DETECTED.  The SARS-CoV-2 RNA is generally detectable in upper respiratory specimens during the acute phase of infection. The lowest concentration of SARS-CoV-2 viral copies this assay can detect is 138 copies/mL. A negative result does not preclude SARS-Cov-2 infection and should not be used as the sole basis for treatment or other patient management decisions. A negative result may occur with  improper specimen collection/handling, submission of specimen other than nasopharyngeal swab, presence of viral mutation(s) within the areas targeted by this assay, and inadequate number of viral copies(<138 copies/mL). A negative result must be combined with clinical observations, patient history, and epidemiological information. The expected result is Negative.  Fact Sheet for Patients:  EntrepreneurPulse.com.au  Fact Sheet for Healthcare Providers:  IncredibleEmployment.be  This test is no t yet approved or cleared by the [place] FDA and  has been authorized for detection and/or diagnosis of SARS-CoV-2 by FDA under an Emergency Use Authorization (EUA). This EUA will remain  in effect (meaning this test can be used)  for the duration of the COVID-19 declaration under Section 564(b)(1) of the Act, 21 U.S.C.section 360bbb-3(b)(1), unless the authorization is terminated  or revoked sooner.       Influenza A by PCR NEGATIVE NEGATIVE Final   Influenza B by PCR NEGATIVE NEGATIVE Final    Comment: (NOTE) The Xpert Xpress SARS-CoV-2/FLU/RSV plus  assay is intended as an aid in the diagnosis of influenza from Nasopharyngeal swab specimens and should not be used as a sole basis for treatment. Nasal washings and aspirates are unacceptable for Xpert Xpress SARS-CoV-2/FLU/RSV testing.  Fact Sheet for Patients: EntrepreneurPulse.com.au  Fact Sheet for Healthcare Providers: IncredibleEmployment.be  This test is not yet approved or cleared by the [place] FDA and has been authorized for detection and/or diagnosis of SARS-CoV-2 by FDA under an Emergency Use Authorization (EUA). This EUA will remain in effect (meaning this test can be used) for the duration of the COVID-19 declaration under Section 564(b)(1) of the Act, 21 U.S.C. section 360bbb-3(b)(1), unless the authorization is terminated or revoked.  Performed at [facility], [place] [address], [place], [place] [address]   Aerobic/Anaerobic Culture (surgical/deep wound)     Status: None (Preliminary result)   Collection Time: 11/12/20 12:06 PM   Specimen: Abscess  Result Value Ref Range Status   Specimen Description ABSCESS PELVIS RIGHT  Final   Special Requests NONE  Final   Gram Stain   Final    RARE WBC PRESENT, PREDOMINANTLY MONONUCLEAR NO ORGANISMS SEEN    Culture   Final    NO GROWTH 4 DAYS NO ANAEROBES ISOLATED; CULTURE IN PROGRESS FOR 5 DAYS Performed at [facility], [place] [address], [place], [place] [address]    Report Status PENDING  Incomplete  Aerobic/Anaerobic Culture (surgical/deep wound)     Status: None (Preliminary result)   Collection Time: 11/13/20  5:48 PM   Specimen: Wound; Abscess  Result Value Ref Range Status   Specimen Description WOUND  Final   Special Requests lt ovary  Final   Gram Stain   Final    ABUNDANT WBC PRESENT, PREDOMINANTLY PMN RARE GRAM POSITIVE COCCI IN PAIRS    Culture   Final    MODERATE STREPTOCOCCUS ANGINOSIS NO ANAEROBES ISOLATED; CULTURE IN PROGRESS FOR 5 DAYS     Report Status PENDING  Incomplete   Organism ID, Bacteria STREPTOCOCCUS ANGINOSIS  Final      Susceptibility   Streptococcus anginosis - MIC*    PENICILLIN <=0.06 SENSITIVE Sensitive     CEFTRIAXONE 0.25 SENSITIVE Sensitive     ERYTHROMYCIN >=8 RESISTANT Resistant     LEVOFLOXACIN 0.5 SENSITIVE Sensitive     VANCOMYCIN Value in next row Sensitive      0.5 SENSITIVEPerformed at [place] [address], [place], [place] [address]    * MODERATE STREPTOCOCCUS ANGINOSIS    Imaging: IR Sinus/Fist Tube Chk-Non GI  Result Date: 11/15/2020 INDICATION: Crohn's disease, left tubo-ovarian abscess, pelvic pain, status post 2 percutaneous drain EXAM: Fluoroscopic injection of both pelvic drains MEDICATIONS: The patient is currently admitted to the hospital and receiving intravenous antibiotics. The antibiotics were administered within an appropriate time frame prior to the initiation of the procedure. ANESTHESIA/SEDATION: Moderate Sedation Time:  None. The patient was continuously monitored during the procedure by the interventional radiology nurse under my direct supervision. COMPLICATIONS: None immediate. PROCEDURE: Informed written consent was obtained from the patient after a thorough discussion of the procedural risks, benefits and alternatives. All questions were addressed. Maximal Sterile  Barrier Technique was utilized including caps, mask, sterile gowns, sterile gloves, sterile drape, hand hygiene and skin antiseptic. A timeout was performed prior to the initiation of the procedure. Anterior right pelvic drain injection: Contrast fills a partially collapsed dependent abscess cavity within the pelvis which correlates with the prior CT. No communication to adjacent bowel or adnexa. Left transgluteal pelvic drain injection: Stable drain catheter position. Abscess cavity collapsed. There are complex sinus tracks extending from the drain catheter site but without any clear communication or fistula  to adjacent bowel. IMPRESSION: Stable drain catheter positions. Residual abscess cavities remain at both drain catheter sites. No clear communication to adjacent bowel or adnexa. Electronically Signed   By: Jerilynn Mages.  Shick M.D.   On: 11/15/2020 18:29   IR Sinus/Fist Tube Chk-Non GI  Result Date: 11/15/2020 INDICATION: Crohn's disease, left tubo-ovarian abscess, pelvic pain, status post 2 percutaneous drain EXAM: Fluoroscopic injection of both pelvic drains MEDICATIONS: The patient is currently admitted to the hospital and receiving intravenous antibiotics. The antibiotics were administered within an appropriate time frame prior to the initiation of the procedure. ANESTHESIA/SEDATION: Moderate Sedation Time:  None. The patient was continuously monitored during the procedure by the interventional radiology nurse under my direct supervision. COMPLICATIONS: None immediate. PROCEDURE: Informed written consent was obtained from the patient after a thorough discussion of the procedural risks, benefits and alternatives. All questions were addressed. Maximal Sterile Barrier Technique was utilized including caps, mask, sterile gowns, sterile gloves, sterile drape, hand hygiene and skin antiseptic. A timeout was performed prior to the initiation of the procedure. Anterior right pelvic drain injection: Contrast fills a partially collapsed dependent abscess cavity within the pelvis which correlates with the prior CT. No communication to adjacent bowel or adnexa. Left transgluteal pelvic drain injection: Stable drain catheter position. Abscess cavity collapsed. There are complex sinus tracks extending from the drain catheter site but without any clear communication or fistula to adjacent bowel. IMPRESSION: Stable drain catheter positions. Residual abscess cavities remain at both drain catheter sites. No clear communication to adjacent bowel or adnexa. Electronically Signed   By: Jerilynn Mages.  Shick M.D.   On: 11/15/2020 18:29

## 2020-11-17 ENCOUNTER — Encounter: Payer: Self-pay | Admitting: Hematology and Oncology

## 2020-11-17 ENCOUNTER — Inpatient Hospital Stay: Payer: Self-pay | Attending: Hematology and Oncology | Admitting: Hematology and Oncology

## 2020-11-17 ENCOUNTER — Ambulatory Visit: Payer: Self-pay | Admitting: Obstetrics and Gynecology

## 2020-11-17 LAB — AEROBIC/ANAEROBIC CULTURE W GRAM STAIN (SURGICAL/DEEP WOUND): Culture: NO GROWTH

## 2020-11-17 NOTE — Progress Notes (Signed)
This encounter was created in error - please disregard.

## 2020-11-17 NOTE — Progress Notes (Signed)
Patient ID: Rebecca Bell, female   DOB: [birth year], 19 y.o.   MRN: [id number]  PROGRESS NOTE  Rebecca Bell RSW:[id number] DOB: September 04, 2001 DOA: 11/10/2020 PCP: Patient, No Pcp Per  Assessment/Plan: 1. TOA s/p IR drainage on antibiotics, slow improvement   Consultants:  General surgery, IR  Procedures:  IR drain  Antibiotics:  Unasyn (indicate start date, and stop date if known)  HPI/Subjective: Patient is sleeping but arousable, pain slowly improved  Objective: Vitals:   11/17/20 0547 11/17/20 1012  BP: 95/61 90/64  Pulse: 89 79  Resp: 17 18  Temp: 98 F (36.7 C) 98.3 F (36.8 C)  SpO2: 100% 99%    Intake/Output Summary (Last 24 hours) at 11/17/2020 1436 Last data filed at 11/17/2020 1424 Gross per 24 hour  Intake 487.27 ml  Output 30 ml  Net 457.27 ml   Filed Weights   11/10/20 1449  Weight: 51.4 kg    Exam:   General:  sleepy  Cardiovascular: RRR  Respiratory: nl effort  Abdomen: ND, mild tenderness    Data Reviewed: Basic Metabolic Panel: Recent Labs  Lab 11/14/20 0811  NA 133*  K 3.7  CL 95*  CO2 28  GLUCOSE 103*  BUN 5*  CREATININE 0.88  CALCIUM 9.1   Liver Function Tests: Recent Labs  Lab 11/14/20 0811  AST 11*  ALT 8  ALKPHOS 42  BILITOT 0.5  PROT 7.2  ALBUMIN 2.6*   No results for input(s): LIPASE, AMYLASE in the last 168 hours. No results for input(s): AMMONIA in the last 168 hours. CBC: Recent Labs  Lab 11/10/20 1453 11/14/20 0811  WBC 12.3* 13.5*  NEUTROABS 9.6* 11.0*  HGB 10.0* 9.8*  HCT 34.2* 33.1*  MCV 66.3* 64.3*  PLT 699* 671*   Cardiac Enzymes: No results for input(s): CKTOTAL, CKMB, CKMBINDEX, TROPONINI in the last 168 hours. BNP (last 3 results) No results for input(s): BNP in the last 8760 hours.  ProBNP (last 3 results) No results for input(s): PROBNP in the last 8760 hours.  CBG: No results for input(s): GLUCAP in the last 168 hours.  Recent Results (from the past 240 hour(s))  Resp  Panel by RT-PCR (Flu A&B, Covid) Nasopharyngeal Swab     Status: None   Collection Time: 11/10/20  4:14 PM   Specimen: Nasopharyngeal Swab; Nasopharyngeal(NP) swabs in vial transport medium  Result Value Ref Range Status   SARS Coronavirus 2 by RT PCR NEGATIVE NEGATIVE Final    Comment: (NOTE) SARS-CoV-2 target nucleic acids are NOT DETECTED.  The SARS-CoV-2 RNA is generally detectable in upper respiratory specimens during the acute phase of infection. The lowest concentration of SARS-CoV-2 viral copies this assay can detect is 138 copies/mL. A negative result does not preclude SARS-Cov-2 infection and should not be used as the sole basis for treatment or other patient management decisions. A negative result may occur with  improper specimen collection/handling, submission of specimen other than nasopharyngeal swab, presence of viral mutation(s) within the areas targeted by this assay, and inadequate number of viral copies(<138 copies/mL). A negative result must be combined with clinical observations, patient history, and epidemiological information. The expected result is Negative.  Fact Sheet for Patients:  EntrepreneurPulse.com.au  Fact Sheet for Healthcare Providers:  IncredibleEmployment.be  This test is no t yet approved or cleared by the [place] FDA and  has been authorized for detection and/or diagnosis of SARS-CoV-2 by FDA under an Emergency Use Authorization (EUA). This EUA will remain  in effect (meaning this test can  be used) for the duration of the COVID-19 declaration under Section 564(b)(1) of the Act, 21 U.S.C.section 360bbb-3(b)(1), unless the authorization is terminated  or revoked sooner.       Influenza A by PCR NEGATIVE NEGATIVE Final   Influenza B by PCR NEGATIVE NEGATIVE Final    Comment: (NOTE) The Xpert Xpress SARS-CoV-2/FLU/RSV plus assay is intended as an aid in the diagnosis of influenza from Nasopharyngeal  swab specimens and should not be used as a sole basis for treatment. Nasal washings and aspirates are unacceptable for Xpert Xpress SARS-CoV-2/FLU/RSV testing.  Fact Sheet for Patients: EntrepreneurPulse.com.au  Fact Sheet for Healthcare Providers: IncredibleEmployment.be  This test is not yet approved or cleared by the [place] FDA and has been authorized for detection and/or diagnosis of SARS-CoV-2 by FDA under an Emergency Use Authorization (EUA). This EUA will remain in effect (meaning this test can be used) for the duration of the COVID-19 declaration under Section 564(b)(1) of the Act, 21 U.S.C. section 360bbb-3(b)(1), unless the authorization is terminated or revoked.  Performed at [facility], [place] [address], [place], [place] [address]   Aerobic/Anaerobic Culture (surgical/deep wound)     Status: None   Collection Time: 11/12/20 12:06 PM   Specimen: Abscess  Result Value Ref Range Status   Specimen Description ABSCESS PELVIS RIGHT  Final   Special Requests NONE  Final   Gram Stain   Final    RARE WBC PRESENT, PREDOMINANTLY MONONUCLEAR NO ORGANISMS SEEN    Culture   Final    No growth aerobically or anaerobically. Performed at [facility], [place] [address], [place], [place] [address]    Report Status 11/17/2020 FINAL  Final  Aerobic/Anaerobic Culture (surgical/deep wound)     Status: None (Preliminary result)   Collection Time: 11/13/20  5:48 PM   Specimen: Wound; Abscess  Result Value Ref Range Status   Specimen Description WOUND  Final   Special Requests lt ovary  Final   Gram Stain   Final    ABUNDANT WBC PRESENT, PREDOMINANTLY PMN RARE GRAM POSITIVE COCCI IN PAIRS    Culture   Final    MODERATE STREPTOCOCCUS ANGINOSIS NO ANAEROBES ISOLATED; CULTURE IN PROGRESS FOR 5 DAYS    Report Status PENDING  Incomplete   Organism ID, Bacteria STREPTOCOCCUS ANGINOSIS  Final      Susceptibility   Streptococcus  anginosis - MIC*    PENICILLIN <=0.06 SENSITIVE Sensitive     CEFTRIAXONE 0.25 SENSITIVE Sensitive     ERYTHROMYCIN >=8 RESISTANT Resistant     LEVOFLOXACIN 0.5 SENSITIVE Sensitive     VANCOMYCIN Value in next row Sensitive      0.5 SENSITIVEPerformed at [facility] [address], [place], [place] [address]    * MODERATE STREPTOCOCCUS ANGINOSIS     Studies: IR Sinus/Fist Tube Chk-Non GI  Result Date: 11/15/2020 INDICATION: Crohn's disease, left tubo-ovarian abscess, pelvic pain, status post 2 percutaneous drain EXAM: Fluoroscopic injection of both pelvic drains MEDICATIONS: The patient is currently admitted to the hospital and receiving intravenous antibiotics. The antibiotics were administered within an appropriate time frame prior to the initiation of the procedure. ANESTHESIA/SEDATION: Moderate Sedation Time:  None. The patient was continuously monitored during the procedure by the interventional radiology nurse under my direct supervision. COMPLICATIONS: None immediate. PROCEDURE: Informed written consent was obtained from the patient after a thorough discussion of the procedural risks, benefits and alternatives. All questions were addressed. Maximal Sterile Barrier Technique was utilized including caps,  mask, sterile gowns, sterile gloves, sterile drape, hand hygiene and skin antiseptic. A timeout was performed prior to the initiation of the procedure. Anterior right pelvic drain injection: Contrast fills a partially collapsed dependent abscess cavity within the pelvis which correlates with the prior CT. No communication to adjacent bowel or adnexa. Left transgluteal pelvic drain injection: Stable drain catheter position. Abscess cavity collapsed. There are complex sinus tracks extending from the drain catheter site but without any clear communication or fistula to adjacent bowel. IMPRESSION: Stable drain catheter positions. Residual abscess cavities remain at both drain catheter sites.  No clear communication to adjacent bowel or adnexa. Electronically Signed   By: Jerilynn Mages.  Shick M.D.   On: 11/15/2020 18:29   IR Sinus/Fist Tube Chk-Non GI  Result Date: 11/15/2020 INDICATION: Crohn's disease, left tubo-ovarian abscess, pelvic pain, status post 2 percutaneous drain EXAM: Fluoroscopic injection of both pelvic drains MEDICATIONS: The patient is currently admitted to the hospital and receiving intravenous antibiotics. The antibiotics were administered within an appropriate time frame prior to the initiation of the procedure. ANESTHESIA/SEDATION: Moderate Sedation Time:  None. The patient was continuously monitored during the procedure by the interventional radiology nurse under my direct supervision. COMPLICATIONS: None immediate. PROCEDURE: Informed written consent was obtained from the patient after a thorough discussion of the procedural risks, benefits and alternatives. All questions were addressed. Maximal Sterile Barrier Technique was utilized including caps, mask, sterile gowns, sterile gloves, sterile drape, hand hygiene and skin antiseptic. A timeout was performed prior to the initiation of the procedure. Anterior right pelvic drain injection: Contrast fills a partially collapsed dependent abscess cavity within the pelvis which correlates with the prior CT. No communication to adjacent bowel or adnexa. Left transgluteal pelvic drain injection: Stable drain catheter position. Abscess cavity collapsed. There are complex sinus tracks extending from the drain catheter site but without any clear communication or fistula to adjacent bowel. IMPRESSION: Stable drain catheter positions. Residual abscess cavities remain at both drain catheter sites. No clear communication to adjacent bowel or adnexa. Electronically Signed   By: Jerilynn Mages.  Shick M.D.   On: 11/15/2020 18:29    Scheduled Meds: . docusate sodium  100 mg Oral BID  . sodium chloride flush  3 mL Intravenous Q12H  . sodium chloride flush  5 mL  Intracatheter Q8H  . sodium chloride flush  5 mL Intracatheter Q8H   Continuous Infusions: . sodium chloride    . 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

## 2020-11-17 NOTE — Progress Notes (Signed)
Patient ID: Rebecca Bell, female   DOB: 2001-06-13, 19 y.o.   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    Objective  VITALS:  height is 5' 5"  (1.651 m) and weight is 51.4 kg. Her temperature is 97.8 F (36.6 C). Her blood pressure is 91/61 and her pulse is 93. Her respiration is 16 and oxygen saturation is 100%.   EXAMINATION: CONSTITUTIONAL: Well-developed, well-nourished female in no acute distress.  HENT:  Normocephalic, atraumatic, External right and left ear normal. Oropharynx is clear and moist EYES: Conjunctivae and EOM are normal. Pupils are equal, round, and reactive to light. No scleral icterus.  NECK: Normal range of motion, supple, no masses SKIN: Skin is warm and dry. No rash noted. Not diaphoretic. No erythema. No pallor. [place]: Alert and oriented to person, place, and time. No cranial nerve deficit noted. PSYCHIATRIC: Normal mood and affect. Normal behavior. Normal judgment and thought content. CARDIOVASCULAR: Normal heart rate noted RESPIRATORY: Effort and breath sounds normal, no problems with respiration noted MUSCULOSKELETAL: Normal range of motion. No edema and no pain with movement ABDOMEN: Soft, mild tenderness no guarding or rebound   Laboratory Reports: Results for orders placed or performed during the hospital encounter of 11/10/20 (from the past 72 hour(s))  CBC with Differential/Platelet     Status: Abnormal   Collection Time: 11/14/20  8:11 AM  Result Value Ref Range   WBC 13.5 (H) 4.0 - 10.5 K/uL   RBC 5.15 (H) 3.87 - 5.11 MIL/uL   Hemoglobin 9.8 (L) 12.0 - 15.0 g/dL   HCT 33.1 (L) 36 - 46 %   MCV 64.3 (L) 80.0 - 100.0 fL   MCH 19.0 (L) 26.0 - 34.0 pg   MCHC 29.6  (L) 30.0 - 36.0 g/dL   RDW 26.3 (H) 11.5 - 15.5 %   Platelets 671 (H) 150 - 400 K/uL    Comment: REPEATED TO VERIFY   nRBC 0.0 0.0 - 0.2 %   Neutrophils Relative % 82 %   Neutro Abs 11.0 (H) 1.7 - 7.7 K/uL   Lymphocytes Relative 8 %   Lymphs Abs 1.1 0.7 - 4.0 K/uL   Monocytes Relative 7 %   Monocytes Absolute 0.9 0.1 - 1.0 K/uL   Eosinophils Relative 3 %   Eosinophils Absolute 0.4 0.0 - 0.5 K/uL   Basophils Relative 0 %   Basophils Absolute 0.1 0.0 - 0.1 K/uL   Immature Granulocytes 0 %   Abs Immature Granulocytes 0.04 0.00 - 0.07 K/uL    Comment: Performed at [facility], [address], [place], [place] [address]  Comprehensive metabolic panel     Status: Abnormal   Collection Time: 11/14/20  8:11 AM  Result Value Ref Range   Sodium 133 (L) 135 - 145 mmol/L   Potassium 3.7 3.5 - 5.1 mmol/L   Chloride 95 (L) 98 - 111 mmol/L   CO2 28 22 - 32 mmol/L   Glucose, Bld 103 (H) 70 - 99 mg/dL    Comment: Glucose reference range applies only to samples taken after fasting for at least 8 hours.   BUN 5 (L) 6 - 20 mg/dL   Creatinine, Ser 0.88 0.44 - 1.00  mg/dL   Calcium 9.1 8.9 - 10.3 mg/dL   Total Protein 7.2 6.5 - 8.1 g/dL   Albumin 2.6 (L) 3.5 - 5.0 g/dL   AST 11 (L) 15 - 41 U/L   ALT 8 0 - 44 U/L   Alkaline Phosphatase 42 38 - 126 U/L   Total Bilirubin 0.5 0.3 - 1.2 mg/dL   GFR, Estimated >60 >60 mL/min    Comment: (NOTE) Calculated using the CKD-EPI Creatinine Equation (2021)    Anion gap 10 5 - 15    Comment: Performed at [facility] [address] [place], [place] [address]   Radiology Reports: IR Sinus/Fist Tube Chk-Non GI  Result Date: 11/15/2020 INDICATION: Crohn's disease, left tubo-ovarian abscess, pelvic pain, status post 2 percutaneous drain EXAM: Fluoroscopic injection of both pelvic drains MEDICATIONS: The patient is currently admitted to the hospital and receiving intravenous antibiotics. The antibiotics were administered within an appropriate time  frame prior to the initiation of the procedure. ANESTHESIA/SEDATION: Moderate Sedation Time:  None. The patient was continuously monitored during the procedure by the interventional radiology nurse under my direct supervision. COMPLICATIONS: None immediate. PROCEDURE: Informed written consent was obtained from the patient after a thorough discussion of the procedural risks, benefits and alternatives. All questions were addressed. Maximal Sterile Barrier Technique was utilized including caps, mask, sterile gowns, sterile gloves, sterile drape, hand hygiene and skin antiseptic. A timeout was performed prior to the initiation of the procedure. Anterior right pelvic drain injection: Contrast fills a partially collapsed dependent abscess cavity within the pelvis which correlates with the prior CT. No communication to adjacent bowel or adnexa. Left transgluteal pelvic drain injection: Stable drain catheter position. Abscess cavity collapsed. There are complex sinus tracks extending from the drain catheter site but without any clear communication or fistula to adjacent bowel. IMPRESSION: Stable drain catheter positions. Residual abscess cavities remain at both drain catheter sites. No clear communication to adjacent bowel or adnexa. Electronically Signed   By: Jerilynn Mages.  Shick M.D.   On: 11/15/2020 18:29   IR Sinus/Fist Tube Chk-Non GI  Result Date: 11/15/2020 INDICATION: Crohn's disease, left tubo-ovarian abscess, pelvic pain, status post 2 percutaneous drain EXAM: Fluoroscopic injection of both pelvic drains MEDICATIONS: The patient is currently admitted to the hospital and receiving intravenous antibiotics. The antibiotics were administered within an appropriate time frame prior to the initiation of the procedure. ANESTHESIA/SEDATION: Moderate Sedation Time:  None. The patient was continuously monitored during the procedure by the interventional radiology nurse under my direct supervision. COMPLICATIONS: None immediate.  PROCEDURE: Informed written consent was obtained from the patient after a thorough discussion of the procedural risks, benefits and alternatives. All questions were addressed. Maximal Sterile Barrier Technique was utilized including caps, mask, sterile gowns, sterile gloves, sterile drape, hand hygiene and skin antiseptic. A timeout was performed prior to the initiation of the procedure. Anterior right pelvic drain injection: Contrast fills a partially collapsed dependent abscess cavity within the pelvis which correlates with the prior CT. No communication to adjacent bowel or adnexa. Left transgluteal pelvic drain injection: Stable drain catheter position. Abscess cavity collapsed. There are complex sinus tracks extending from the drain catheter site but without any clear communication or fistula to adjacent bowel. IMPRESSION: Stable drain catheter positions. Residual abscess cavities remain at both drain catheter sites. No clear communication to adjacent bowel or adnexa. Electronically Signed   By: Jerilynn Mages.  Shick M.D.   On: 11/15/2020 18:29   DG Abd Portable 1V  Result Date: 11/15/2020 CLINICAL  DATA:  Nausea. Abdominal pain. History of tubo-ovarian abscesses. Patient has bilateral pelvic drains. EXAM: PORTABLE ABDOMEN - 1 VIEW COMPARISON:  Pelvic CT 11/13/2020 FINDINGS: Bilateral pigtail drains in the pelvis. Gas-filled structure in the central and right side of the pelvis is indeterminate but likely represents colon. Overall, nonobstructive bowel gas pattern. Evidence for a navel ring. IMPRESSION: 1. Gas-filled structure in the pelvis likely represents colon. Overall, nonobstructive bowel gas pattern. 2. Bilateral pelvic drains. Electronically Signed   By: Markus Daft M.D.   On: 11/15/2020 08:52    I personally reviewed Labs and Imaging Studies under Results section.    ASSESSMENT/PLAN: TOA: Drain injections were negative for fistula. Continue with IV abx for TOA, and then remove drains per IR. S/p IR  drainage pelvic fluid collections gram pos noted from left TOA aspirate.  DVT Prophylaxis:  Ambulation,SCD   Code Status: Full Disposition Plan: pending  Time spent: 10 mis   LOS: 6 days   Cherre Blanc, MD Obstetrician & Gynecologist, [facility] for [employer], [place]

## 2020-11-18 ENCOUNTER — Inpatient Hospital Stay (HOSPITAL_COMMUNITY): Payer: Medicaid Other

## 2020-11-18 HISTORY — PX: IR SINUS/FIST TUBE CHK-NON GI: IMG673

## 2020-11-18 LAB — AEROBIC/ANAEROBIC CULTURE W GRAM STAIN (SURGICAL/DEEP WOUND)

## 2020-11-18 LAB — CBC WITH DIFFERENTIAL/PLATELET
Abs Immature Granulocytes: 0 10*3/uL (ref 0.00–0.07)
Basophils Absolute: 0.1 10*3/uL (ref 0.0–0.1)
Basophils Relative: 1 %
Eosinophils Absolute: 1 10*3/uL — ABNORMAL HIGH (ref 0.0–0.5)
Eosinophils Relative: 9 %
HCT: 32.1 % — ABNORMAL LOW (ref 36.0–46.0)
Hemoglobin: 9.4 g/dL — ABNORMAL LOW (ref 12.0–15.0)
Lymphocytes Relative: 10 %
Lymphs Abs: 1.2 10*3/uL (ref 0.7–4.0)
MCH: 19.2 pg — ABNORMAL LOW (ref 26.0–34.0)
MCHC: 29.3 g/dL — ABNORMAL LOW (ref 30.0–36.0)
MCV: 65.5 fL — ABNORMAL LOW (ref 80.0–100.0)
Monocytes Absolute: 0.6 10*3/uL (ref 0.1–1.0)
Monocytes Relative: 5 %
Neutro Abs: 8.7 10*3/uL — ABNORMAL HIGH (ref 1.7–7.7)
Neutrophils Relative %: 75 %
Platelets: 675 10*3/uL — ABNORMAL HIGH (ref 150–400)
RBC: 4.9 MIL/uL (ref 3.87–5.11)
RDW: 26.5 % — ABNORMAL HIGH (ref 11.5–15.5)
WBC: 11.6 10*3/uL — ABNORMAL HIGH (ref 4.0–10.5)
nRBC: 0 % (ref 0.0–0.2)
nRBC: 0 /100 WBC

## 2020-11-18 MED ORDER — IOHEXOL 300 MG/ML  SOLN
75.0000 mL | Freq: Once | INTRAMUSCULAR | Status: AC | PRN
  Administered 2020-11-18: 75 mL via INTRAVENOUS

## 2020-11-18 MED ORDER — IOHEXOL 300 MG/ML  SOLN
50.0000 mL | Freq: Once | INTRAMUSCULAR | Status: AC | PRN
  Administered 2020-11-18: 10 mL

## 2020-11-18 NOTE — Progress Notes (Signed)
Patient seen in IR for repeat drain injection. Left transgluteal drain injection demonstrated a fistula to the distal small bowel. The right pelvic drain showed no residual abscess and this drain was removed in IR.   Recommend repeat consult with both GI and Surgery teams; concern for inflammatory bowel disease.   IR will continue to follow. Please call IR with questions/concerns.  Soyla Dryer, [place] [phone] 11/18/2020, 5:06 PM

## 2020-11-18 NOTE — Plan of Care (Signed)

## 2020-11-18 NOTE — Progress Notes (Signed)
Referring Physician(s): Dr. Vivien Rota  Supervising Physician: Sandi Mariscal  Patient Status:  [facility] - In-pt  Chief Complaint: Left tubo-ovarian abscess; pelvic fluid collections  Subjective: Patient sitting up in bed, seems in better spirits. She denies pain or discomfort.   HPI: Rebecca Mahoneis a 19 y.o.femalewith past medical history of abdominal painandovarian cysts whopresented to the ED 11/24/21with recurrence of pelvic pain related to worsening TOA/pelvic fluid collection. Patient known to IR from recent aspiration of pelvic fluid collection 11/2/21with 280 mL fluid removed and resolutionof collection. The culture from that fluid was negative for organism growth.She reporteda few days of relief before slow, progressive return of her pain. She had a right pelvic fluid collection aspiration with drain placement on 11/12/20. She returned to IR again on 11/13/20 for a left adnexal fluid collection aspiration with drain placement (transgluteal approach). Both drains were injected with contrast 11/15/20 with fluoroscopy imaging confirming proper position of both drains, residual abscess cavities and no clear communication to adjacent bowel or adnexa. CT pelvis 11/18/20 shows resolution of abscesses.   Allergies: Patient has no known allergies.  Medications: Prior to Admission medications   Medication Sig Start Date End Date Taking? Authorizing Provider  doxycycline (VIBRA-TABS) 100 MG tablet Take 1 tablet (100 mg total) by mouth every 12 (twelve) hours. 11/03/20  Yes Griffin Basil, MD  ferrous sulfate (FERROUSUL) 325 (65 FE) MG tablet Take 1 tablet (325 mg total) by mouth 2 (two) times daily. 10/20/20  Yes Constant, Peggy, MD  ibuprofen (ADVIL) 600 MG tablet Take 1 tablet (600 mg total) by mouth every 6 (six) hours as needed for cramping. 10/20/20  Yes Constant, Peggy, MD  metroNIDAZOLE (FLAGYL) 500 MG tablet Take 1 tablet (500 mg total) by mouth every 12 (twelve) hours.  11/03/20  Yes Griffin Basil, MD  oxyCODONE-acetaminophen (PERCOCET/ROXICET) 5-325 MG tablet Take 1 tablet by mouth every 4 (four) hours as needed (moderate to severe pain (when tolerating fluids)). 10/20/20  Yes Constant, Peggy, MD     Vital Signs: BP 104/64 (BP Location: Left Arm)   Pulse 92   Temp 98.1 F (36.7 C) (Oral)   Resp 16   Ht 5' 5"  (1.651 m)   Wt 113 lb 5.1 oz (51.4 kg)   LMP 11/06/2020   SpO2 100%   BMI 18.86 kg/m   Physical Exam Constitutional:      General: She is not in acute distress. Pulmonary:     Effort: Pulmonary effort is normal.  Abdominal:     Palpations: Abdomen is soft.     Tenderness: There is no abdominal tenderness.     Comments: RLQ drain to gravity. Scant, thin serosanguinous fluid in bag. Left transgluteal drain to JP bulb. Approximately 5 ml of light serosanguineous fluid in bulb.    Skin:    General: Skin is warm and dry.  Neurological:     Mental Status: She is alert and oriented to person, place, and time.     Imaging: IR Sinus/Fist Tube Chk-Non GI  Result Date: 11/15/2020 INDICATION: Crohn's disease, left tubo-ovarian abscess, pelvic pain, status post 2 percutaneous drain EXAM: Fluoroscopic injection of both pelvic drains MEDICATIONS: The patient is currently admitted to the hospital and receiving intravenous antibiotics. The antibiotics were administered within an appropriate time frame prior to the initiation of the procedure. ANESTHESIA/SEDATION: Moderate Sedation Time:  None. The patient was continuously monitored during the procedure by the interventional radiology nurse under my direct supervision. COMPLICATIONS: None immediate. PROCEDURE: Informed written  consent was obtained from the patient after a thorough discussion of the procedural risks, benefits and alternatives. All questions were addressed. Maximal Sterile Barrier Technique was utilized including caps, mask, sterile gowns, sterile gloves, sterile drape, hand hygiene and  skin antiseptic. A timeout was performed prior to the initiation of the procedure. Anterior right pelvic drain injection: Contrast fills a partially collapsed dependent abscess cavity within the pelvis which correlates with the prior CT. No communication to adjacent bowel or adnexa. Left transgluteal pelvic drain injection: Stable drain catheter position. Abscess cavity collapsed. There are complex sinus tracks extending from the drain catheter site but without any clear communication or fistula to adjacent bowel. IMPRESSION: Stable drain catheter positions. Residual abscess cavities remain at both drain catheter sites. No clear communication to adjacent bowel or adnexa. Electronically Signed   By: Jerilynn Mages.  Shick M.D.   On: 11/15/2020 18:29   IR Sinus/Fist Tube Chk-Non GI  Result Date: 11/15/2020 INDICATION: Crohn's disease, left tubo-ovarian abscess, pelvic pain, status post 2 percutaneous drain EXAM: Fluoroscopic injection of both pelvic drains MEDICATIONS: The patient is currently admitted to the hospital and receiving intravenous antibiotics. The antibiotics were administered within an appropriate time frame prior to the initiation of the procedure. ANESTHESIA/SEDATION: Moderate Sedation Time:  None. The patient was continuously monitored during the procedure by the interventional radiology nurse under my direct supervision. COMPLICATIONS: None immediate. PROCEDURE: Informed written consent was obtained from the patient after a thorough discussion of the procedural risks, benefits and alternatives. All questions were addressed. Maximal Sterile Barrier Technique was utilized including caps, mask, sterile gowns, sterile gloves, sterile drape, hand hygiene and skin antiseptic. A timeout was performed prior to the initiation of the procedure. Anterior right pelvic drain injection: Contrast fills a partially collapsed dependent abscess cavity within the pelvis which correlates with the prior CT. No communication to  adjacent bowel or adnexa. Left transgluteal pelvic drain injection: Stable drain catheter position. Abscess cavity collapsed. There are complex sinus tracks extending from the drain catheter site but without any clear communication or fistula to adjacent bowel. IMPRESSION: Stable drain catheter positions. Residual abscess cavities remain at both drain catheter sites. No clear communication to adjacent bowel or adnexa. Electronically Signed   By: Jerilynn Mages.  Shick M.D.   On: 11/15/2020 18:29   DG Abd Portable 1V  Result Date: 11/15/2020 CLINICAL DATA:  Nausea. Abdominal pain. History of tubo-ovarian abscesses. Patient has bilateral pelvic drains. EXAM: PORTABLE ABDOMEN - 1 VIEW COMPARISON:  Pelvic CT 11/13/2020 FINDINGS: Bilateral pigtail drains in the pelvis. Gas-filled structure in the central and right side of the pelvis is indeterminate but likely represents colon. Overall, nonobstructive bowel gas pattern. Evidence for a navel ring. IMPRESSION: 1. Gas-filled structure in the pelvis likely represents colon. Overall, nonobstructive bowel gas pattern. 2. Bilateral pelvic drains. Electronically Signed   By: Markus Daft M.D.   On: 11/15/2020 08:52    Labs:  CBC: Recent Labs    11/04/20 1432 11/10/20 1453 11/14/20 0811 11/18/20 1107  WBC 12.5* 12.3* 13.5* 11.6*  HGB 10.5* 10.0* 9.8* 9.4*  HCT 35.4* 34.2* 33.1* 32.1*  PLT 734* 699* 671* 675*    COAGS: Recent Labs    10/18/20 1445 11/10/20 1453  INR 1.1 1.2  APTT  --  38*    BMP: Recent Labs    10/13/20 0943 10/14/20 1501 11/14/20 0811  NA 136 134* 133*  K 3.3* 3.3* 3.7  CL 100 98 95*  CO2 25 26 28   GLUCOSE 102* 92 103*  BUN 16 10 5*  CALCIUM 9.1 8.6* 9.1  CREATININE 0.86 0.72 0.88  GFRNONAA >60 >60 >60    LIVER FUNCTION TESTS: Recent Labs    10/13/20 0943 11/14/20 0811  BILITOT 0.2* 0.5  AST 16 11*  ALT 11 8  ALKPHOS 65 42  PROT 8.8* 7.2  ALBUMIN 3.4* 2.6*    Assessment and Plan:  Left tubo-ovarian abscess; left  and right pelvic fluid collections S/p anterior right lower quadrant drain and left transgluteal drain placements 11/26 and 11/27 2021  Left tubo-ovarian abscess aspirate with gram positive cocci on cultures. 20 ml of output documented in Epic and another 5 ml in JP bulb.   Right pelvic fluid collection with no growth on cultures. 20 ml of output documented in Epic; scant amount of thin, clear/light yellow fluid in gravity bag.   Both drains were injected with contrast 11/15/20 with fluoroscopy imaging confirming proper position of both drains, residual abscess cavities and no clear communication to adjacent bowel or adnexa. CT imaging today (11/18/20) shows resolution of abscesses.   Please continue with current drain management for RLQ drain: flush drain each shift; document output; change dressing daily or as needed. An order has been placed to flush the left transgluteal drain just once daily due to patient discomfort.   Plan is for the patient to return to IR today for repeat drain injections. If patient needs to be discharged home with drains in place she will need to follow up with [facility] as an outpatient in 10-14 days.    Other plans per primary teams.   IR will continue to follow.  Electronically Signed: Soyla Dryer, AGACNP-BC [phone] 11/18/2020, 3:42 PM   I spent a total of 25 Minutes at the the patient's bedside AND on the patient's hospital floor or unit, greater than 50% of which was counseling/coordinating care for left and right pelvic fluid collection drains.

## 2020-11-18 NOTE — Plan of Care (Signed)
  Problem: Activity: Goal: Risk for activity intolerance will decrease Outcome: Progressing   Problem: Coping: Goal: Level of anxiety will decrease Outcome: Progressing   Problem: Elimination: Goal: Will not experience complications related to bowel motility Outcome: Progressing

## 2020-11-18 NOTE — Progress Notes (Signed)
Subjective: not using pain medication often, still poor appetite Patient reports tolerating PO.    Objective: I have reviewed patient's vital signs, intake and output, medications, labs, microbiology and radiology results. Blood pressure 107/63, pulse 86, temperature (!) 97.3 F (36.3 C), resp. rate 18, height 5' 5"  (1.651 m), weight 51.4 kg, last menstrual period 11/06/2020, SpO2 99 %.  General: alert, cooperative and no distress GI: soft, non-tender; bowel sounds normal; no masses,  no organomegaly Extremities: extremities normal, atraumatic, no cyanosis or edema Cloudy fluid in drain, dressing clean and dry  Assessment/Plan: Will plan for outpatient drain management with IR for when she is discharged  LOS: 8 days    Rebecca Bell 11/18/2020, 10:34 AM

## 2020-11-19 ENCOUNTER — Other Ambulatory Visit (HOSPITAL_COMMUNITY): Payer: Self-pay | Admitting: Obstetrics & Gynecology

## 2020-11-19 MED ORDER — AMOXICILLIN-POT CLAVULANATE 875-125 MG PO TABS: 1.0000 | ORAL_TABLET | Freq: Two times a day (BID) | ORAL | 1 refills | Status: DC

## 2020-11-19 MED ORDER — OXYCODONE-ACETAMINOPHEN 5-325 MG PO TABS
1.0000 | ORAL_TABLET | Freq: Four times a day (QID) | ORAL | 0 refills | Status: DC | PRN
Start: 2020-11-19 — End: 2020-12-02

## 2020-11-19 MED FILL — AMOX-CLAV 875-125 MG TABLET: 875-125 | 14 days supply | Qty: 28 | Fill #0

## 2020-11-19 MED FILL — OXYCODONE-APAP 5-325MG: 5-325 | 2 days supply | Qty: 12 | Fill #0

## 2020-11-19 NOTE — Plan of Care (Signed)
  Problem: Education: Goal: Knowledge of General Education information will improve Description: Including pain rating scale, medication(s)/side effects and non-pharmacologic comfort measures Outcome: Progressing   Problem: Health Behavior/Discharge Planning: Goal: Ability to manage health-related needs will improve Outcome: Progressing   Problem: Clinical Measurements: Goal: Ability to maintain clinical measurements within normal limits will improve Outcome: Progressing Goal: Will remain free from infection Outcome: Progressing Goal: Diagnostic test results will improve Outcome: Progressing Goal: Respiratory complications will improve Outcome: Progressing Goal: Cardiovascular complication will be avoided Outcome: Progressing   Problem: Activity: Goal: Risk for activity intolerance will decrease Outcome: Progressing   Problem: Nutrition: Goal: Adequate nutrition will be maintained Outcome: Progressing   Problem: Coping: Goal: Level of anxiety will decrease Outcome: Progressing   Problem: Elimination: Goal: Will not experience complications related to bowel motility Outcome: Progressing Goal: Will not experience complications related to urinary retention Outcome: Progressing   Problem: Pain Managment: Goal: General experience of comfort will improve Outcome: Progressing   Problem: Safety: Goal: Ability to remain free from injury will improve Outcome: Progressing   Problem: Skin Integrity: Goal: Risk for impaired skin integrity will decrease Outcome: Progressing   Problem: Education: Goal: Required Educational Video(s) Outcome: Progressing   Problem: Clinical Measurements: Goal: Postoperative complications will be avoided or minimized Outcome: Progressing   Problem: Skin Integrity: Goal: Demonstration of wound healing without infection will improve Outcome: Progressing   Problem: Activity: Goal: Will identify at least one activity in which they can  participate Outcome: Progressing   Problem: Coping: Goal: Ability to identify and develop effective coping behavior will improve Outcome: Progressing Goal: Ability to interact with others will improve Outcome: Progressing Goal: Demonstration of participation in decision-making regarding own care will improve Outcome: Progressing Goal: Ability to use eye contact when communicating with others will improve Outcome: Progressing   Problem: Health Behavior/Discharge Planning: Goal: Identification of resources available to assist in meeting health care needs will improve Outcome: Progressing   Problem: Self-Concept: Goal: Will verbalize positive feelings about self Outcome: Progressing

## 2020-11-19 NOTE — Progress Notes (Signed)
Patient with discharge plans for today. I spoke with the patient over the phone before she left and discussed drain care and her follow up appointment with [place]. I notified her that a scheduler from our office will call her to arrange an appointment to be seen in the outpatient clinic in 10-14 days. I instructed her to accurately document the daily output from her drain and to keep the site clean and dry. She understands that the site cannot be submerged in water and must be covered with an occlusive dressing. She knows to call the office or the clinic with any questions or concerns.  Rebecca Bell, [place] [phone] 11/19/2020, 11:22 AM

## 2020-11-19 NOTE — Progress Notes (Signed)
Discharge instructions (including medications) will be  discussed with and a copy provided to patient.

## 2020-11-19 NOTE — Discharge Summary (Signed)
Physician Discharge Summary  Patient ID: Rebecca Bell MRN: [id number] DOB/AGE: [birth year] 21 y.o.  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 G0P0000 presenting for admission for ongoing pelvic pain in the setting of enlarging TOA/pelvic fluid collection. Reports ongoing pain in lower abdomen that is worse than when she left hospital. Denies fever/chills, nausea/vomiting. Does report poor appetite and loose stools. No other complaints.   She was admitted 10/28-11/3 for same and underwent drainage by IR on 11/2 (280 mLs) with near resolution of collection. No drain left in place, no organisms grew out. At follow up visit, she was put back on doxy/flagyl and ordered for f/u US as she had ongoing pain. She was admitted for pelvic pain in the setting of enlarging pelvic fluid collection/TOA. Previously drained. Given significant enlargement in short interval, recommended for admission for IV antibiotics and drainage.  IR drains were placed 11/26 right pelvic fluid collection via right anterior pelvis. She remained on IV antibiotics which were changed to Unasyn. CT suggested possible ilium involvement with IBD possible Crohn's disease as possibility. GI and General surgery were consulted. Both signed off 11/30 when fluoroscopic study failed to show a fistula to the bowel. Imaging was repeated 12/2 to assess the drains and fistula to the ileum was seen. The decision was made to remove the right anterior lower abdominal/pelvic drainage catheter. The external portion the drainage catheter was cut and drainage catheters removed intact. Dressing was applied.  The remaining left trans gluteal approach percutaneous  drainage catheter was flushed with a small saline converted from a JP bulb to a gravity bag. A dressing applied.  She was discharge to f/u with general surgery, IR and [facility] Consults: GI, general surgery and radiology  Significant Diagnostic Studies: radiology:   Treatments: procedures: percutaneous drainage catheter placement  Discharge Exam: Blood pressure 93/61, pulse 80, temperature 98 F (36.7 C), temperature source Oral, resp. rate 18, height 5' 5"  (1.651 m), weight 51.4 kg, last menstrual period 11/06/2020, SpO2 100 %. General appearance: alert, cooperative and no distress Resp: normal effort GI: soft, non-tender; bowel sounds normal; no masses,  no organomegaly  Disposition: Discharge disposition: 01-Home or Self Care       Discharge Instructions    Discharge patient   Complete by: As directed    Discharge disposition: 01-Home or Self Care   Discharge patient date: 11/19/2020     Allergies as of 11/19/2020   No Known Allergies     Medication List    STOP taking these medications   doxycycline 100 MG tablet Commonly known as: VIBRA-TABS   ferrous sulfate 325 (65 FE) MG tablet Commonly known as: FerrouSul   metroNIDAZOLE 500 MG tablet Commonly known as: FLAGYL     TAKE these medications   amoxicillin-clavulanate 875-125 MG tablet Commonly known as: AUGMENTIN Take 1 tablet by mouth 2 (two) times daily.   ibuprofen 600 MG tablet Commonly known as: ADVIL Take 1 tablet (600 mg total) by mouth every 6 (six) hours as needed for cramping.   oxyCODONE-acetaminophen 5-325 MG tablet Commonly known as: PERCOCET/ROXICET Take 1-2 tablets by mouth every 6 (six) hours as needed. What changed:   how much to take  when to take this  reasons to take this  Follow-up Information    Theresa Duty, NP Follow up on 11/29/2020.   Specialty: Radiology Why: [facility] information: [place]  [id number] [phone]             [facility] 3 weeks  Signed: Emeterio Reeve 11/19/2020, 9:12 AM

## 2020-11-19 NOTE — Discharge Instructions (Signed)
Colitis  Colitis is inflammation of the colon. Colitis may last a short time (be acute), or it may last a long time (become chronic). What are the causes? This condition may be caused by:  Viruses.  Bacteria.  Reaction to medicine.  Certain autoimmune diseases such as Crohn's disease or ulcerative colitis.  Radiation treatment.  Decreased blood flow to the bowel (ischemia). What are the signs or symptoms? Symptoms of this condition include:  Watery diarrhea.  Passing bloody or tarry stool.  Pain.  Fever.  Vomiting.  Tiredness (fatigue).  Weight loss.  Bloating.  Abdominal pain.  Having fewer bowel movements than usual.  A strong and sudden urge to have a bowel movement.  Feeling like the bowel is not empty after a bowel movement. How is this diagnosed? This condition is diagnosed with a stool test or a blood test. You may also have other tests, such as:  X-rays.  CT scan.  Colonoscopy.  Endoscopy.  Biopsy. How is this treated? Treatment for this condition depends on the cause. The condition may be treated by:  Resting the bowel. This involves not eating or drinking for a period of time.  Fluids that are given through an IV.  Medicine for pain and diarrhea.  Antibiotic medicines.  Cortisone medicines.  Surgery. Follow these instructions at home: Eating and drinking   Follow instructions from your health care provider about eating or drinking restrictions.  Drink enough fluid to keep your urine pale yellow.  Work with a dietitian to determine which foods cause your condition to flare up.  Avoid foods that cause flare-ups.  Eat a well-balanced diet. General instructions  If you were prescribed an antibiotic medicine, take it as told by your health care provider. Do not stop taking the antibiotic even if you start to feel better.  Take over-the-counter and prescription medicines only as told by your health care provider.  Keep all  follow-up visits as told by your health care provider. This is important. Contact a health care provider if:  Your symptoms do not go away.  You develop new symptoms. Get help right away if you:  Have a fever that does not go away with treatment.  Develop chills.  Have extreme weakness, fainting, or dehydration.  Have repeated vomiting.  Develop severe pain in your abdomen.  Pass bloody or tarry stool. Summary  Colitis is inflammation of the colon. Colitis may last a short time (be acute), or it may last a long time (become chronic).  Treatment for this condition depends on the cause and may include resting the bowel, taking medicines, or having surgery.  If you were prescribed an antibiotic medicine, take it as told by your health care provider. Do not stop taking the antibiotic even if you start to feel better.  Get help right away if you develop severe pain in your abdomen.  Keep all follow-up visits as told by your health care provider. This is important. This information is not intended to replace advice given to you by your health care provider. Make sure you discuss any questions you have with your health care provider. Document Revised: 06/06/2018 Document Reviewed: 06/06/2018 Elsevier Patient Education  2020 [employer].

## 2020-11-22 ENCOUNTER — Other Ambulatory Visit: Payer: Self-pay | Admitting: Obstetrics & Gynecology

## 2020-11-22 DIAGNOSIS — N7093 Salpingitis and oophoritis, unspecified: Secondary | ICD-10-CM

## 2020-11-24 ENCOUNTER — Ambulatory Visit: Payer: Self-pay | Admitting: Family Medicine

## 2020-11-30 ENCOUNTER — Ambulatory Visit
Admission: RE | Admit: 2020-11-30 | Discharge: 2020-11-30 | Disposition: A | Discharge status: Home / Self Care | Payer: Self-pay | Source: Ambulatory Visit | Attending: Obstetrics & Gynecology | Admitting: Obstetrics & Gynecology

## 2020-11-30 ENCOUNTER — Ambulatory Visit
Admission: RE | Admit: 2020-11-30 | Discharge: 2020-11-30 | Disposition: A | Discharge status: Home / Self Care | Payer: Self-pay | Source: Ambulatory Visit | Attending: Student | Admitting: Student

## 2020-11-30 ENCOUNTER — Other Ambulatory Visit: Payer: Self-pay | Admitting: Obstetrics & Gynecology

## 2020-11-30 ENCOUNTER — Other Ambulatory Visit (INDEPENDENT_AMBULATORY_CARE_PROVIDER_SITE_OTHER): Payer: Self-pay

## 2020-11-30 ENCOUNTER — Encounter: Payer: Self-pay | Admitting: Radiology

## 2020-11-30 DIAGNOSIS — N7093 Salpingitis and oophoritis, unspecified: Secondary | ICD-10-CM

## 2020-11-30 HISTORY — PX: IR RADIOLOGIST EVAL & MGMT: IMG5224

## 2020-11-30 NOTE — Progress Notes (Signed)
Chief Complaint: Patient was seen in consultation today for abscess drain injection  Referring Physician(s): Dr. Emeterio Reeve Dr. Serita Grammes  History of Present Illness: Rebecca Bell is a 19 y.o. female who was initially seen in hospital for complex abscesses in the pelvis, with potentially mixed etiology of TOA vs possible inflammatory bowel disease. She had 2 separate abscess drains placed with the RLQ drain subsequently being removed prior to discharge. On her final imaging prior to discharge, drain injection confirmed communication to the bowel. It was recommended she follow up with both surgical and GI services in addition to IR drain clinic. She reports she has some upcoming appointments on her calendar but is unsure with who.  She reports pain at her drain site. She is eating okay and bowels are moving. She remains on abx. Reports about 15-37m beige foul smelling output per day. She is not flushing as instructed.  Past Medical History:  Diagnosis Date  . Abdominal pain   . Ovarian cyst     Past Surgical History:  Procedure Laterality Date  . COLONOSCOPY  2020  . IR SINUS/FIST TUBE CHK-NON GI  11/15/2020  . IR SINUS/FIST TUBE CHK-NON GI  11/15/2020  . IR SINUS/FIST TUBE CHK-NON GI  11/18/2020  . TIBIA FRACTURE SURGERY      Allergies: Patient has no known allergies.  Medications: Prior to Admission medications   Medication Sig Start Date End Date Taking? Authorizing Provider  amoxicillin-clavulanate (AUGMENTIN) 875-125 MG tablet Take 1 tablet by mouth 2 (two) times daily. 11/19/20   AWoodroe Mode MD  ibuprofen (ADVIL) 600 MG tablet Take 1 tablet (600 mg total) by mouth every 6 (six) hours as needed for cramping. 10/20/20   Constant, Peggy, MD  oxyCODONE-acetaminophen (PERCOCET/ROXICET) 5-325 MG tablet Take 1-2 tablets by mouth every 6 (six) hours as needed. 11/19/20   AWoodroe Mode MD     No family history on file.  Social History   Socioeconomic  History  . Marital status: Single    Spouse name: Not on file  . Number of children: Not on file  . Years of education: Not on file  . Highest education level: Not on file  Occupational History  . Occupation: cScientist, water quality Tobacco Use  . Smoking status: Current Every Day Smoker  . Smokeless tobacco: Never Used  Substance and Sexual Activity  . Alcohol use: Yes    Comment: occ  . Drug use: Not on file    Comment: last time 1 month ago  . Sexual activity: Not on file  Other Topics Concern  . Not on file  Social History Narrative  . Not on file   Social Determinants of Health   Financial Resource Strain: Not on file  Food Insecurity: No Food Insecurity  . Worried About RCharity fundraiserin the Last Year: Never true  . Ran Out of Food in the Last Year: Never true  Transportation Needs: No Transportation Needs  . Lack of Transportation (Medical): No  . Lack of Transportation (Non-Medical): No  Physical Activity: Not on file  Stress: Not on file  Social Connections: Not on file     Review of Systems: A 12 point ROS discussed and pertinent positives are indicated in the HPI above.  All other systems are negative.  Review of Systems  Vital Signs: LMP 11/06/2020   Physical Exam Lungs: CTA without w/r/r Heart: Regular Abdomen: soft, ND, NT Drain:(L)TG intact, site with some dried crusted purulence at insertion  site. Dark beige purulent fluid in bag.  Drain injection today shows communication to the bowel. See full report.   Imaging: CT PELVIS W CONTRAST  Result Date: 11/18/2020 CLINICAL DATA:  Concern for Crohn's colitis with history of bilateral tubo-ovarian abscess post CT-guided placement bilateral percutaneous drainage catheters. There is been little to no output from the right-sided percutaneous catheter for the past several days with minimal amount of residual purulent output left trans gluteal approach percutaneous catheter. EXAM: CT PELVIS WITH CONTRAST TECHNIQUE:  Multidetector CT imaging of the pelvis was performed using the standard protocol following the bolus administration of intravenous contrast. CONTRAST:  74m OMNIPAQUE IOHEXOL 300 MG/ML  SOLN COMPARISON:  Pelvic CT-11/13/2020; 11/10/2020; 12/29/2019 CT guided pelvic cul-de-sac fluid collection aspiration-10/19/2020 CT guided right adnexal percutaneous drainage catheter placement-11/12/2020 CT guided left trans gluteal approach percutaneous drainage catheter placement-11/13/2020 FINDINGS: Urinary Tract: Normal appearance of the urinary bladder given degree distention. Bowel: Redemonstrated extensive inflammatory change of bowel wall thickening involving the terminal ileum associated upstream distension the small-bowel. Similar appearance suspected fistulous connection between distal small bowel and the left adnexa coronal images 38 and 41, series 6. Presumed secondary inflammatory change again is noted to involve the appendix. Redemonstrated mild wall thickening involving the distal aspect the sigmoid colon rectum. No evidence of perforation or definable/drainable collection. Vascular/Lymphatic: Pelvic vasculature appears patent and of normal caliber. Redemonstrated presumably reactive bilateral pelvic sidewall lymph nodes index left common iliac chain lymph node measuring 0.8 cm greatest short axis diameter (image 18 series 5), presumably reactive. Reproductive: Stable positioning bilateral adnexal drainage catheters with apparent complete resolution drain bilateral adnexal collections. There is a minimal amount free fluid in the pelvic cul-de-sac without peripheral wall enhancement. No new definable/drainable collection within the pelvis. Other: Regional soft tissues appear normal. Musculoskeletal: No acute or aggressive osseous. IMPRESSION: 1. Interval complete resolution of bilateral adnexal collections following placement of bilateral percutaneous drainage catheters. No new definable/drainable fluid collections  within pelvis. 2. Similar appearance of suspected fistulous connection between the distal small bowel and the left adnexa. 3. Redemonstrated extensive inflammatory change of the terminal ileum with associated upstream distension of the small-bowel again greater than would be expected for adjacent reactive inflammatory change again worrisome for inflammatory bowel disease such as Crohn's colitis. PLAN: Patient subsequently underwent percutaneous drainage injection. Electronically Signed   By: JSandi MariscalM.D.   On: 11/18/2020 18:03   CT PELVIS W CONTRAST  Result Date: 11/13/2020 CLINICAL DATA:  History of tubo-ovarian abscess post right-sided percutaneous drainage catheter placement. EXAM: CT PELVIS WITH CONTRAST TECHNIQUE: Multidetector CT imaging of the pelvis was performed using the standard protocol following the bolus administration of intravenous contrast. CONTRAST:  1043mOMNIPAQUE IOHEXOL 300 MG/ML  SOLN COMPARISON:  CT abdomen pelvis-11/10/2020; 10/18/2020; CT guided aspiration of pelvic fluid collection via left trans gluteal approach-10/19/2020. CT guided right anterior pelvic percutaneous catheter placement-11/12/2020 FINDINGS: Urinary Tract: Normal appearance of the urinary bladder given degree distention. Bowel: There is ill-defined wall thickening and adjacent stranding involving multiple loops of distal small bowel (coronal images 16 through 46, series 5) with associated upstream distension of the small bowel. Redemonstrated potential fistulous connection between the terminal ileum and the inflammatory to process within the left adnexa (coronal image 45, series 5). Inflammatory change now involves the tip of the appendix (images 35, 40 and 42), nonspecific though presumably reactive in etiology. No discrete definable/drainable fluid collections within the abdomen or pelvis. Vascular/Lymphatic: Pelvic vasculature appears patent. Presumably reactive pelvic lymph nodes with index  left common iliac  chain lymph node measuring 1 cm greatest short axis diameter (image 9, series 3). Reproductive: Interval reduction/near resolution of previously noted dominant fluid collection within the midline of the pelvis following right anterior approach percutaneous drainage catheter placement. Unchanged appearance of small (approximately 2.5 x 2.0 cm) right adnexal fluid collection (image 23, series 3), though the drainage catheter appears to traverse this more superficial collection. Stable appearance of the approximately 3.4 x 2.8 cm left-sided adnexal collection (image 61, series 3), unchanged compared to the 11/10/2020 examination. No new fluid collections within the abdomen or pelvis. Other:  Regional soft tissues appear normal. Musculoskeletal: No acute or aggressive osseous abnormalities. Normal appearance of the bilateral SI joints. IMPRESSION: 1. Redemonstrated extensive wall thickening and ill-defined stranding involving multiple loops of distal small bowel extending to the level of the pelvis,greater than would be expected for adjacent reactive inflammatory change, and while nonspecific could be seen in the setting of inflammatory bowel disease such as Crohn's colitis. Clinical correlation is advised. 2. Interval reduction/near resolution of previously noted dominant fluid collection within the midline of the pelvis following right anterior approach percutaneous drainage catheter placement. 3. Grossly unchanged small (approximately 2.5 cm) right adnexal fluid collection, though the drainage catheter appears to traverse this more superficial collection. 4. Stable appearance of approximately 3.4 cm left-sided adnexal collection, again with suspected fistulous connection to the adjacent distal small bowel, unchanged compared to the 11/10/2020 examination. PLAN: Patient now will be arranged to undergo attempted CT-guided aspiration and/or drainage catheter placement into the residual left adnexal collection  Electronically Signed   By: Sandi Mariscal M.D.   On: 11/13/2020 12:22   IR Sinus/Fist Tube Chk-Non GI  Result Date: 11/18/2020 CLINICAL DATA:  Concern for Crohn's colitis with history of bilateral tubo-ovarian abscess post CT-guided placement bilateral percutaneous drainage catheters. There is been little to no output from the right-sided percutaneous catheter for the past several days with minimal amount of residual purulent output left trans gluteal approach percutaneous catheter. Preceding pelvic CT demonstrates complete resolution bilateral adnexal collections. As such patient for fluoroscopic guided drainage catheter injection prior to potential removal. EXAM: SINUS TRACT INJECTION/FISTULOGRAM COMPARISON:  Pelvic CT-earlier same day; 11/13/2020; 11/10/2020; 12/29/2019 Fluoroscopic guided drainage catheter injection-11/15/2020 CT guided pelvic cul-de-sac fluid collection aspiration-10/19/2020 CT guided right adnexal percutaneous drainage catheter placement-11/12/2020 CT guided left trans gluteal approach percutaneous drainage catheter placement-11/13/2020 CONTRAST:  10 cc Omnipaque 300-administered via the existing percutaneous drain. FLUOROSCOPY TIME:  1 minute, 30 seconds (36 mGy) TECHNIQUE: The patient was positioned supine on the fluoroscopy table. A preprocedural spot fluoroscopic image was obtained of the lower pelvis and the existing bilateral adnexal drainage catheters. Attention was first paid towards the left trans gluteal approach percutaneous drainage catheter. Small amount of contrast was injected as multiple spot fluoroscopic and radiographic images were obtained various obliquities. Next, small amount of contrast was injected via the right anterior approach percutaneous drainage catheter. Images reviewed and discussed patient. The decision was made to remove the right anterior lower abdominal/pelvic drainage catheter. The external portion the drainage catheter was cut and drainage catheters  removed intact. Dressing was applied. The remaining left trans gluteal approach percutaneous drainage catheter was flushed with a small saline converted from a JP bulb to a gravity bag. A dressing applied. The patient tolerated the above procedures well without immediate postprocedural complication. FINDINGS: Preprocedural spot fluoroscopic image demonstrates unchanged positioning bilateral percutaneous drainage catheters with end coiled and locked over the bilateral adnexa. Excreted contrast is  seen within bladder Contrast injection of the left trans gluteal approach percutaneous drainage catheter demonstrates opacification the decompressed left adnexal abscess cavity with communication between the decompressed abscess cavity and the adjacent dysmorphic appearing distal small bowel as was suggested on preceding pelvic CT. Contrast injection of the right anterior approach drainage catheter demonstrates opacification of the residual right-sided adnexal abscess cavity as well as spillage of contrast into the right hemipelvis. There is no definitive evidence of fistulous connection between the right-sided drainage catheter and intestines. IMPRESSION: 1. Contrast injection confirms fistulous connection between the left trans gluteal approach adnexal drainage catheter and the distal small bowel, as was suggested on preceding pelvic CT. 2. Contrast injection of the right anterior approach lower abdominal/pelvic drainage catheter is negative for definitive fistulous connection. As such, this drainage catheter was removed at the patient's bedside without incident. PLAN: - patient's remaining left trans gluteal approach drainage catheter was converted from a JP bulb to a gravity bag. The patient was instructed to no longer flush the drainage catheter but to maintain diligent records regarding daily drainage catheter output. - The patient may follow-up at the interventional radiology drain clinic the week of December 13 with  drainage catheter injection (no CT is necessary given resolution bilateral adnexal abscesses on today's CT and in hopes of limiting the patient's radiation exposure). - Again, recommend surgical and gastroenterology consultation given concern the above process is secondary to inflammatory bowel disease (such as Crohn's colitis) as opposed to a gynecologic infection. Electronically Signed   By: Sandi Mariscal M.D.   On: 11/18/2020 18:12     Labs:  CBC: Recent Labs    11/04/20 1432 11/10/20 1453 11/14/20 0811 11/18/20 1107  WBC 12.5* 12.3* 13.5* 11.6*  HGB 10.5* 10.0* 9.8* 9.4*  HCT 35.4* 34.2* 33.1* 32.1*  PLT 734* 699* 671* 675*    COAGS: Recent Labs    10/18/20 1445 11/10/20 1453  INR 1.1 1.2  APTT  --  38*    BMP: Recent Labs    10/13/20 0943 10/14/20 1501 11/14/20 0811  NA 136 134* 133*  K 3.3* 3.3* 3.7  CL 100 98 95*  CO2 25 26 28   GLUCOSE 102* 92 103*  BUN 16 10 5*  CALCIUM 9.1 8.6* 9.1  CREATININE 0.86 0.72 0.88  GFRNONAA >60 >60 >60    LIVER FUNCTION TESTS: Recent Labs    10/13/20 0943 11/14/20 0811  BILITOT 0.2* 0.5  AST 16 11*  ALT 11 8  ALKPHOS 65 42  PROT 8.8* 7.2  ALBUMIN 3.4* 2.6*    TUMOR MARKERS: No results for input(s): AFPTM, CEA, CA199, CHROMGRNA in the last 8760 hours.  Assessment and Plan: Pelvic abscess with +fistula to the bowel. Possible underlying inflammatory bowel disease. Drain needs to remain to gravity, no flushes. Will set up for recheck drain injection in about 3 weeks. Hopefully this allows some time for her to see the other specialists as recommended.  Thank you for this interesting consult.  I greatly enjoyed meeting [facility] and look forward to participating in their care.  A copy of this report was sent to the requesting provider on this date.  Electronically Signed: Ascencion Dike 11/30/2020, 8:47 AM   I spent a total of 25 minutes in face to face in clinical consultation, greater than 50% of which was  counseling/coordinating care for pelvic abscess drain with fistula

## 2020-12-02 ENCOUNTER — Other Ambulatory Visit: Payer: Self-pay | Admitting: Obstetrics & Gynecology

## 2020-12-02 DIAGNOSIS — N7093 Salpingitis and oophoritis, unspecified: Secondary | ICD-10-CM

## 2020-12-02 MED ORDER — OXYCODONE-ACETAMINOPHEN 5-325 MG PO TABS: 1.0000 | ORAL_TABLET | Freq: Four times a day (QID) | ORAL | 0 refills | Status: DC | PRN

## 2020-12-02 MED FILL — OXYCODONE-ACETAMINOPHEN 5-3: 5-325 | 1 days supply | Qty: 8 | Fill #0

## 2020-12-02 NOTE — Telephone Encounter (Signed)
Sent 8 tabs to [facility], no more refills

## 2020-12-03 ENCOUNTER — Telehealth: Payer: Self-pay | Admitting: Student

## 2020-12-03 NOTE — Telephone Encounter (Signed)
IR.  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  Received call from patient at 1152 regarding pressure/pain at drain. States that intermittently over the past few days she developed pressure/pain near drain- states it feels like she needs to have a bowel movement, however she tries and is unable ("nothing comes out")- she wants to make sure this is normal. States drain output has not changed (approximately 10-20 cc per day, clear yellow with green debris output). 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 Reassured patient that these types of drains are unfortunately more uncomfortable- what she is feeling is not abnormal. Recommend patient continue current drain management (QD monitor of output). Recommend patient follow-up with her surgeon regarding surgery for fistula. Instructed patient to come to ED or call our office if pain becomes severe or output changes (more feculuent consistency, drainage stops). All questions answered and concerns addressed. Patient conveys understanding and agrees with plan.  Please call IR with questions/concerns.   Bea Graff Giavanni Odonovan, PA-C 12/03/2020, 12:09 PM

## 2020-12-06 ENCOUNTER — Other Ambulatory Visit: Payer: Self-pay | Admitting: Obstetrics & Gynecology

## 2020-12-06 ENCOUNTER — Telehealth: Payer: Self-pay | Admitting: Student

## 2020-12-06 DIAGNOSIS — N7093 Salpingitis and oophoritis, unspecified: Secondary | ICD-10-CM

## 2020-12-06 NOTE — Telephone Encounter (Signed)
IR.  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  Received call from patient at 1052 regarding drainage from left TG insertion site. States that over the past few days she has had intermittent drainage of "pus" from drain insertion site- states that one time it saturated dressing, otherwise did not. States that in addition has caused itching/irritation of her "butterfly clip" (statlock). She asks if 1- this is normal and 2- if she is able to come in for a dressing change for a new "butterfly clip" (statlock). Informed patient that this is normal given location of this drain- the way her "butterfly clip" (statlock) is sitting could be irritating the drain (in addition, as previously discussed unfortunately these types of drains are more uncomfortable). Explained given the holiday week, our clinic is only open on Wednesday- asked if patient can wait this long for dressing change or does she need to be seen sooner (in which case appointment will need to be made at [facility]). Patient states that she is ok to wait until Wednesday will send message to clinic to facilitate scheduling, informed patient that IR schedulers will call her to set up this appointment. Instructed patient to call us back if she cannot wait until Wednesday to be seen. All questions answered and concerns addressed. Patient conveys understanding and agrees with plan.  Please call IR with questions/concerns.   Bea Graff Arneda Sappington, PA-C 12/06/2020, 11:06 AM

## 2020-12-08 ENCOUNTER — Ambulatory Visit
Admission: RE | Admit: 2020-12-08 | Discharge: 2020-12-08 | Disposition: A | Discharge status: Home / Self Care | Payer: Self-pay | Source: Ambulatory Visit | Attending: Obstetrics & Gynecology | Admitting: Obstetrics & Gynecology

## 2020-12-08 ENCOUNTER — Other Ambulatory Visit (HOSPITAL_COMMUNITY): Payer: Self-pay | Admitting: Diagnostic Radiology

## 2020-12-08 ENCOUNTER — Other Ambulatory Visit: Payer: Self-pay | Admitting: Student

## 2020-12-08 ENCOUNTER — Encounter: Payer: Self-pay | Admitting: *Deleted

## 2020-12-08 DIAGNOSIS — L0291 Cutaneous abscess, unspecified: Secondary | ICD-10-CM

## 2020-12-08 DIAGNOSIS — N7093 Salpingitis and oophoritis, unspecified: Secondary | ICD-10-CM

## 2020-12-08 HISTORY — PX: IR RADIOLOGIST EVAL & MGMT: IMG5224

## 2020-12-08 NOTE — Progress Notes (Signed)
Chief Complaint: Patient was seen in consultation today for intra-abdominal fluid collection  Referring Physician(s): Dr. Emeterio Reeve Dr. Serita Grammes  History of Present Illness: Rebecca Bell is a 19 y.o. female who was initially seen in hospital for complex abscesses in the pelvis, with potentially mixed etiology of TOA vs possible inflammatory bowel disease. She had 2 separate abscess drains placed with the RLQ drain (anterior drain placed 11/27 which has since removed and L TG drain placed 11/27 which remains in place). On her final imaging prior to discharge, drain injection confirmed communication to the bowel. It was recommended she follow up with both surgical and GI services in addition to IR drain clinic.  Rebecca Bell was seen in routine follow-up 11/30/20 for drain evaluation and management at which time drain injection showed a persistent fistulous connection. He was having increased abdominal pain on the day of evaluation and deferred dressing change to be done at home with help of her sister. Rebecca Bell called the office 12/06/20 to report intermittent drainage of pus from her site. She was scheduled for a site check and dressing change today.   Rebecca Bell presents to IR drain clinic today for assessment of her site and dressing change.  She report negligible drainage into her gravity bag and significant leakage of foul-smelling output from around her drain which is saturating dressings and clothes.  She does in fact have significant purulent-appearing drainage which has saturated her gauze dressing and the washcloth she has brought with her tucked in her undergarments. She denies fever, chills, but endorses occasional abdominal pain vs. Cramping. She has been eating and drinking.   Past Medical History:  Diagnosis Date  . Abdominal pain   . Ovarian cyst     Past Surgical History:  Procedure Laterality Date  . COLONOSCOPY  2020  . IR RADIOLOGIST EVAL & MGMT   11/30/2020  . IR RADIOLOGIST EVAL & MGMT  12/08/2020  . IR SINUS/FIST TUBE CHK-NON GI  11/15/2020  . IR SINUS/FIST TUBE CHK-NON GI  11/15/2020  . IR SINUS/FIST TUBE CHK-NON GI  11/18/2020  . TIBIA FRACTURE SURGERY      Allergies: Patient has no known allergies.  Medications: Prior to Admission medications   Medication Sig Start Date End Date Taking? Authorizing Provider  amoxicillin-clavulanate (AUGMENTIN) 875-125 MG tablet Take 1 tablet by mouth 2 (two) times daily. 11/19/20   Woodroe Mode, MD  ibuprofen (ADVIL) 600 MG tablet Take 1 tablet (600 mg total) by mouth every 6 (six) hours as needed for cramping. 10/20/20   Constant, Peggy, MD  oxyCODONE-acetaminophen (PERCOCET/ROXICET) 5-325 MG tablet Take 1-2 tablets by mouth every 6 (six) hours as needed. 11/19/20   Woodroe Mode, MD     No family history on file.  Social History   Socioeconomic History  . Marital status: Single    Spouse name: Not on file  . Number of children: Not on file  . Years of education: Not on file  . Highest education level: Not on file  Occupational History  . Occupation: Scientist, water quality  Tobacco Use  . Smoking status: Current Every Day Smoker  . Smokeless tobacco: Never Used  Substance and Sexual Activity  . Alcohol use: Yes    Comment: occ  . Drug use: Not on file    Comment: last time 1 month ago  . Sexual activity: Not on file  Other Topics Concern  . Not on file  Social History Narrative  . Not on file  Social Determinants of Health   Financial Resource Strain: Not on file  Food Insecurity: No Food Insecurity  . Worried About Charity fundraiser in the Last Year: Never true  . Ran Out of Food in the Last Year: Never true  Transportation Needs: No Transportation Needs  . Lack of Transportation (Medical): No  . Lack of Transportation (Non-Medical): No  Physical Activity: Not on file  Stress: Not on file  Social Connections: Not on file     Review of Systems: A 12 point ROS discussed  and pertinent positives are indicated in the HPI above.  All other systems are negative.  Review of Systems  Vital Signs: There were no vitals taken for this visit.  Physical Exam NAD, alert Abdomen: soft, non-tender, Left TG drain intact but with copious amount of purulent material leaking around skin and saturating gauze/towel. Very small amount of drainage in gravity bag.   Imaging: CT PELVIS W CONTRAST  Result Date: 11/18/2020 CLINICAL DATA:  Concern for Crohn's colitis with history of bilateral tubo-ovarian abscess post CT-guided placement bilateral percutaneous drainage catheters. There is been little to no output from the right-sided percutaneous catheter for the past several days with minimal amount of residual purulent output left trans gluteal approach percutaneous catheter. EXAM: CT PELVIS WITH CONTRAST TECHNIQUE: Multidetector CT imaging of the pelvis was performed using the standard protocol following the bolus administration of intravenous contrast. CONTRAST:  57m OMNIPAQUE IOHEXOL 300 MG/ML  SOLN COMPARISON:  Pelvic CT-11/13/2020; 11/10/2020; 12/29/2019 CT guided pelvic cul-de-sac fluid collection aspiration-10/19/2020 CT guided right adnexal percutaneous drainage catheter placement-11/12/2020 CT guided left trans gluteal approach percutaneous drainage catheter placement-11/13/2020 FINDINGS: Urinary Tract: Normal appearance of the urinary bladder given degree distention. Bowel: Redemonstrated extensive inflammatory change of bowel wall thickening involving the terminal ileum associated upstream distension the small-bowel. Similar appearance suspected fistulous connection between distal small bowel and the left adnexa coronal images 38 and 41, series 6. Presumed secondary inflammatory change again is noted to involve the appendix. Redemonstrated mild wall thickening involving the distal aspect the sigmoid colon rectum. No evidence of perforation or definable/drainable collection.  Vascular/Lymphatic: Pelvic vasculature appears patent and of normal caliber. Redemonstrated presumably reactive bilateral pelvic sidewall lymph nodes index left common iliac chain lymph node measuring 0.8 cm greatest short axis diameter (image 18 series 5), presumably reactive. Reproductive: Stable positioning bilateral adnexal drainage catheters with apparent complete resolution drain bilateral adnexal collections. There is a minimal amount free fluid in the pelvic cul-de-sac without peripheral wall enhancement. No new definable/drainable collection within the pelvis. Other: Regional soft tissues appear normal. Musculoskeletal: No acute or aggressive osseous. IMPRESSION: 1. Interval complete resolution of bilateral adnexal collections following placement of bilateral percutaneous drainage catheters. No new definable/drainable fluid collections within pelvis. 2. Similar appearance of suspected fistulous connection between the distal small bowel and the left adnexa. 3. Redemonstrated extensive inflammatory change of the terminal ileum with associated upstream distension of the small-bowel again greater than would be expected for adjacent reactive inflammatory change again worrisome for inflammatory bowel disease such as Crohn's colitis. PLAN: Patient subsequently underwent percutaneous drainage injection. Electronically Signed   By: JSandi MariscalM.D.   On: 11/18/2020 18:03   CT PELVIS W CONTRAST  Result Date: 11/13/2020 CLINICAL DATA:  History of tubo-ovarian abscess post right-sided percutaneous drainage catheter placement. EXAM: CT PELVIS WITH CONTRAST TECHNIQUE: Multidetector CT imaging of the pelvis was performed using the standard protocol following the bolus administration of intravenous contrast. CONTRAST:  1049mOMNIPAQUE IOHEXOL  300 MG/ML  SOLN COMPARISON:  CT abdomen pelvis-11/10/2020; 10/18/2020; CT guided aspiration of pelvic fluid collection via left trans gluteal approach-10/19/2020. CT guided right  anterior pelvic percutaneous catheter placement-11/12/2020 FINDINGS: Urinary Tract: Normal appearance of the urinary bladder given degree distention. Bowel: There is ill-defined wall thickening and adjacent stranding involving multiple loops of distal small bowel (coronal images 16 through 46, series 5) with associated upstream distension of the small bowel. Redemonstrated potential fistulous connection between the terminal ileum and the inflammatory to process within the left adnexa (coronal image 45, series 5). Inflammatory change now involves the tip of the appendix (images 35, 40 and 42), nonspecific though presumably reactive in etiology. No discrete definable/drainable fluid collections within the abdomen or pelvis. Vascular/Lymphatic: Pelvic vasculature appears patent. Presumably reactive pelvic lymph nodes with index left common iliac chain lymph node measuring 1 cm greatest short axis diameter (image 9, series 3). Reproductive: Interval reduction/near resolution of previously noted dominant fluid collection within the midline of the pelvis following right anterior approach percutaneous drainage catheter placement. Unchanged appearance of small (approximately 2.5 x 2.0 cm) right adnexal fluid collection (image 23, series 3), though the drainage catheter appears to traverse this more superficial collection. Stable appearance of the approximately 3.4 x 2.8 cm left-sided adnexal collection (image 61, series 3), unchanged compared to the 11/10/2020 examination. No new fluid collections within the abdomen or pelvis. Other:  Regional soft tissues appear normal. Musculoskeletal: No acute or aggressive osseous abnormalities. Normal appearance of the bilateral SI joints. IMPRESSION: 1. Redemonstrated extensive wall thickening and ill-defined stranding involving multiple loops of distal small bowel extending to the level of the pelvis,greater than would be expected for adjacent reactive inflammatory change, and while  nonspecific could be seen in the setting of inflammatory bowel disease such as Crohn's colitis. Clinical correlation is advised. 2. Interval reduction/near resolution of previously noted dominant fluid collection within the midline of the pelvis following right anterior approach percutaneous drainage catheter placement. 3. Grossly unchanged small (approximately 2.5 cm) right adnexal fluid collection, though the drainage catheter appears to traverse this more superficial collection. 4. Stable appearance of approximately 3.4 cm left-sided adnexal collection, again with suspected fistulous connection to the adjacent distal small bowel, unchanged compared to the 11/10/2020 examination. PLAN: Patient now will be arranged to undergo attempted CT-guided aspiration and/or drainage catheter placement into the residual left adnexal collection Electronically Signed   By: Sandi Mariscal M.D.   On: 11/13/2020 12:22   IR Sinus/Fist Tube Chk-Non GI  Result Date: 11/18/2020 CLINICAL DATA:  Concern for Crohn's colitis with history of bilateral tubo-ovarian abscess post CT-guided placement bilateral percutaneous drainage catheters. There is been little to no output from the right-sided percutaneous catheter for the past several days with minimal amount of residual purulent output left trans gluteal approach percutaneous catheter. Preceding pelvic CT demonstrates complete resolution bilateral adnexal collections. As such patient for fluoroscopic guided drainage catheter injection prior to potential removal. EXAM: SINUS TRACT INJECTION/FISTULOGRAM COMPARISON:  Pelvic CT-earlier same day; 11/13/2020; 11/10/2020; 12/29/2019 Fluoroscopic guided drainage catheter injection-11/15/2020 CT guided pelvic cul-de-sac fluid collection aspiration-10/19/2020 CT guided right adnexal percutaneous drainage catheter placement-11/12/2020 CT guided left trans gluteal approach percutaneous drainage catheter placement-11/13/2020 CONTRAST:  10 cc Omnipaque  300-administered via the existing percutaneous drain. FLUOROSCOPY TIME:  1 minute, 30 seconds (36 mGy) TECHNIQUE: The patient was positioned supine on the fluoroscopy table. A preprocedural spot fluoroscopic image was obtained of the lower pelvis and the existing bilateral adnexal drainage catheters. Attention was first paid  towards the left trans gluteal approach percutaneous drainage catheter. Small amount of contrast was injected as multiple spot fluoroscopic and radiographic images were obtained various obliquities. Next, small amount of contrast was injected via the right anterior approach percutaneous drainage catheter. Images reviewed and discussed patient. The decision was made to remove the right anterior lower abdominal/pelvic drainage catheter. The external portion the drainage catheter was cut and drainage catheters removed intact. Dressing was applied. The remaining left trans gluteal approach percutaneous drainage catheter was flushed with a small saline converted from a JP bulb to a gravity bag. A dressing applied. The patient tolerated the above procedures well without immediate postprocedural complication. FINDINGS: Preprocedural spot fluoroscopic image demonstrates unchanged positioning bilateral percutaneous drainage catheters with end coiled and locked over the bilateral adnexa. Excreted contrast is seen within bladder Contrast injection of the left trans gluteal approach percutaneous drainage catheter demonstrates opacification the decompressed left adnexal abscess cavity with communication between the decompressed abscess cavity and the adjacent dysmorphic appearing distal small bowel as was suggested on preceding pelvic CT. Contrast injection of the right anterior approach drainage catheter demonstrates opacification of the residual right-sided adnexal abscess cavity as well as spillage of contrast into the right hemipelvis. There is no definitive evidence of fistulous connection between the  right-sided drainage catheter and intestines. IMPRESSION: 1. Contrast injection confirms fistulous connection between the left trans gluteal approach adnexal drainage catheter and the distal small bowel, as was suggested on preceding pelvic CT. 2. Contrast injection of the right anterior approach lower abdominal/pelvic drainage catheter is negative for definitive fistulous connection. As such, this drainage catheter was removed at the patient's bedside without incident. PLAN: - patient's remaining left trans gluteal approach drainage catheter was converted from a JP bulb to a gravity bag. The patient was instructed to no longer flush the drainage catheter but to maintain diligent records regarding daily drainage catheter output. - The patient may follow-up at the interventional radiology drain clinic the week of December 13 with drainage catheter injection (no CT is necessary given resolution bilateral adnexal abscesses on today's CT and in hopes of limiting the patient's radiation exposure). - Again, recommend surgical and gastroenterology consultation given concern the above process is secondary to inflammatory bowel disease (such as Crohn's colitis) as opposed to a gynecologic infection. Electronically Signed   By: Sandi Mariscal M.D.   On: 11/18/2020 18:12     Labs:  CBC: Recent Labs    11/04/20 1432 11/10/20 1453 11/14/20 0811 11/18/20 1107  WBC 12.5* 12.3* 13.5* 11.6*  HGB 10.5* 10.0* 9.8* 9.4*  HCT 35.4* 34.2* 33.1* 32.1*  PLT 734* 699* 671* 675*    COAGS: Recent Labs    10/18/20 1445 11/10/20 1453  INR 1.1 1.2  APTT  --  38*    BMP: Recent Labs    10/13/20 0943 10/14/20 1501 11/14/20 0811  NA 136 134* 133*  K 3.3* 3.3* 3.7  CL 100 98 95*  CO2 25 26 28   GLUCOSE 102* 92 103*  BUN 16 10 5*  CALCIUM 9.1 8.6* 9.1  CREATININE 0.86 0.72 0.88  GFRNONAA >60 >60 >60    LIVER FUNCTION TESTS: Recent Labs    10/13/20 0943 11/14/20 0811  BILITOT 0.2* 0.5  AST 16 11*  ALT 11  8  ALKPHOS 65 42  PROT 8.8* 7.2  ALBUMIN 3.4* 2.6*    TUMOR MARKERS: No results for input(s): AFPTM, CEA, CA199, CHROMGRNA in the last 8760 hours.  Assessment and Plan: Intra-abdominal vs. Pelvic  abscess s/p percutaneous, TG drain placement 11/13/20 by Dr. Serafina Royals. Rebecca Bell presents to IR drain clinic today for evaluation and management of her left TG drain.  She has significant leakage around her drain insertion site without adequate drainage into her gravity bag.  The tubing was flushed without resistance.  There is no obvious obstruction or clog within the tube itself and flush material immediate leaks from the site.  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 Patient evaluated by Dr. Anselm Pancoast who recommends scheduled appointment at [facility] tomorrow for possible drain evaluation vs. Exchange vs. Upsize with sedation.  Rebecca Bell was given instructions to remain NPO p MN tonight and to have a driver available to bring her home tomorrow and stay with her at home.  She will hear from schedulers with date and time of appointment.  She voices understanding.  New stat lock was applied today.  Thank you for this interesting consult.  I greatly enjoyed meeting Rebecca Bell and look forward to participating in their care.  A copy of this report was sent to the requesting provider on this date.  Electronically Signed: Docia Barrier 12/08/2020, 3:48 PM   Please see Dr. Anselm Pancoast attestation of this note for management and plan.

## 2020-12-09 ENCOUNTER — Ambulatory Visit (HOSPITAL_COMMUNITY)
Admission: RE | Admit: 2020-12-09 | Discharge: 2020-12-09 | Disposition: A | Discharge status: Home / Self Care | Payer: Self-pay | Source: Ambulatory Visit | Attending: Diagnostic Radiology | Admitting: Diagnostic Radiology

## 2020-12-09 ENCOUNTER — Other Ambulatory Visit (HOSPITAL_COMMUNITY): Payer: Self-pay | Admitting: Diagnostic Radiology

## 2020-12-09 ENCOUNTER — Other Ambulatory Visit: Payer: Self-pay

## 2020-12-09 DIAGNOSIS — L0291 Cutaneous abscess, unspecified: Secondary | ICD-10-CM | POA: Insufficient documentation

## 2020-12-09 DIAGNOSIS — Z4803 Encounter for change or removal of drains: Secondary | ICD-10-CM | POA: Insufficient documentation

## 2020-12-09 DIAGNOSIS — K632 Fistula of intestine: Secondary | ICD-10-CM | POA: Insufficient documentation

## 2020-12-09 HISTORY — PX: IR CATHETER TUBE CHANGE: IMG717

## 2020-12-09 LAB — PREGNANCY, URINE: Preg Test, Ur: NEGATIVE

## 2020-12-09 MED ORDER — LIDOCAINE HCL 1 % IJ SOLN
INTRAMUSCULAR | Status: AC | PRN
  Administered 2020-12-09: 20 mL via INTRADERMAL

## 2020-12-09 MED ORDER — LIDOCAINE HCL 1 % IJ SOLN
INTRAMUSCULAR | Status: AC
  Filled 2020-12-09: qty 20

## 2020-12-09 MED ORDER — MIDAZOLAM HCL 2 MG/2ML IJ SOLN
INTRAMUSCULAR | Status: AC | PRN
  Administered 2020-12-09: 0.5 mg via INTRAVENOUS

## 2020-12-09 MED ORDER — MIDAZOLAM HCL 2 MG/2ML IJ SOLN
INTRAMUSCULAR | Status: AC
  Filled 2020-12-09: qty 2

## 2020-12-09 MED ORDER — CEFAZOLIN SODIUM-DEXTROSE 2-4 GM/100ML-% IV SOLN: 2.0000 g | Freq: Once | INTRAVENOUS | Status: DC

## 2020-12-09 MED ORDER — IOHEXOL 300 MG/ML  SOLN
50.0000 mL | Freq: Once | INTRAMUSCULAR | Status: AC | PRN
  Administered 2020-12-09: 11:00:00 10 mL

## 2020-12-09 MED ORDER — SODIUM CHLORIDE 0.9 % IV SOLN
INTRAVENOUS | Status: AC | PRN
  Administered 2020-12-09: 10 mL/h via INTRAVENOUS

## 2020-12-09 MED ORDER — FENTANYL CITRATE (PF) 100 MCG/2ML IJ SOLN
INTRAMUSCULAR | Status: AC | PRN
  Administered 2020-12-09: 25 ug via INTRAVENOUS

## 2020-12-09 MED ORDER — CEFAZOLIN SODIUM-DEXTROSE 2-4 GM/100ML-% IV SOLN
INTRAVENOUS | Status: AC
  Filled 2020-12-09: qty 100

## 2020-12-09 MED ORDER — SODIUM CHLORIDE 0.9 % IV SOLN: INTRAVENOUS | Status: DC

## 2020-12-09 MED ORDER — FENTANYL CITRATE (PF) 100 MCG/2ML IJ SOLN
INTRAMUSCULAR | Status: AC
  Filled 2020-12-09: qty 2

## 2020-12-09 NOTE — Discharge Instructions (Addendum)
Percutaneous Abscess Drain, Care After This sheet gives you information about how to care for yourself after your procedure. Your health care provider may also give you more specific instructions. If you have problems or questions, contact your health care provider. What can I expect after the procedure? After your procedure, it is common to have:  A small amount of bruising and discomfort in the area where the drainage tube (catheter) was placed.  Sleepiness and fatigue. This should go away after the medicines you were given have worn off. Follow these instructions at home: Incision care  Follow instructions from your health care provider about how to take care of your incision. Make sure you: ? Wash your hands with soap and water before you change your bandage (dressing). If soap and water are not available, use hand sanitizer. ? Change your dressing as told by your health care provider. ? Leave stitches (sutures), skin glue, or adhesive strips in place. These skin closures may need to stay in place for 2 weeks or longer. If adhesive strip edges start to loosen and curl up, you may trim the loose edges. Do not remove adhesive strips completely unless your health care provider tells you to do that.  Check your incision area every day for signs of infection. Check for: ? More redness, swelling, or pain. ? More fluid or blood. ? Warmth. ? Pus or a bad smell. ? Fluid leaking from around your catheter (instead of fluid draining through your catheter). Catheter care   Follow instructions from your health care provider about emptying and cleaning your catheter and collection bag. You may need to clean the catheter every day so it does not clog.  If directed, write down the following information every time you empty your bag: ? The date and time. ? The amount of drainage. General instructions  Rest at home for 1-2 days after your procedure. Return to your normal activities as told by your  health care provider.  Do not take baths, swim, or use a hot tub for 24 hours after your procedure, or until your health care provider says that this is okay.  Take over-the-counter and prescription medicines only as told by your health care provider.  Keep all follow-up visits as told by your health care provider. This is important. Contact a health care provider if:  You have less than 10 mL of drainage a day for 2-3 days in a row, or as directed by your health care provider.  You have more redness, swelling, or pain around your incision area.  You have more fluid or blood coming from your incision area.  Your incision area feels warm to the touch.  You have pus or a bad smell coming from your incision area.  You have fluid leaking from around your catheter (instead of through your catheter).  You have a fever or chills.  You have pain that does not get better with medicine. Get help right away if:  Your catheter comes out.  You suddenly stop having drainage from your catheter.  You suddenly have blood in the fluid that is draining from your catheter.  You become dizzy or you faint.  You develop a rash.  You have nausea or vomiting.  You have difficulty breathing or you feel short of breath.  You develop chest pain.  You have problems with your speech or vision.  You have trouble balancing or moving your arms or legs. Summary  It is common to have a small  amount of bruising and discomfort in the area where the drainage tube (catheter) was placed.  You may be directed to record the amount of drainage from the bag every time you empty it.  Follow instructions from your health care provider about emptying and cleaning your catheter and collection bag. This information is not intended to replace advice given to you by your health care provider. Make sure you discuss any questions you have with your health care provider. Document Revised: 11/16/2017 Document  Reviewed: 10/26/2016 Elsevier Patient Education  [place]. Moderate Conscious Sedation, Adult Sedation is the use of medicines to promote relaxation and relieve discomfort and anxiety. Moderate conscious sedation is a type of sedation. Under moderate conscious sedation, you are less alert than normal, but you are still able to respond to instructions, touch, or both. Moderate conscious sedation is used during short medical and dental procedures. It is milder than deep sedation, which is a type of sedation under which you cannot be easily woken up. It is also milder than general anesthesia, which is the use of medicines to make you unconscious. Moderate conscious sedation allows you to return to your regular activities sooner. Tell a health care provider about:  Any allergies you have.  All medicines you are taking, including vitamins, herbs, eye drops, creams, and over-the-counter medicines.  Use of steroids (by mouth or creams).  Any problems you or family members have had with sedatives and anesthetic medicines.  Any blood disorders you have.  Any surgeries you have had.  Any medical conditions you have, such as sleep apnea.  Whether you are pregnant or may be pregnant.  Any use of cigarettes, alcohol, marijuana, or street drugs. What are the risks? Generally, this is a safe procedure. However, problems may occur, including:  Getting too much medicine (oversedation).  Nausea.  Allergic reaction to medicines.  Trouble breathing. If this happens, a breathing tube may be used to help with breathing. It will be removed when you are awake and breathing on your own.  Heart trouble.  Lung trouble. What happens before the procedure? Staying hydrated Follow instructions from your health care provider about hydration, which may include:  Up to 2 hours before the procedure - you may continue to drink clear liquids, such as water, clear fruit juice, black coffee, and plain  tea. Eating and drinking restrictions Follow instructions from your health care provider about eating and drinking, which may include:  8 hours before the procedure - stop eating heavy meals or foods such as meat, fried foods, or fatty foods.  6 hours before the procedure - stop eating light meals or foods, such as toast or cereal.  6 hours before the procedure - stop drinking milk or drinks that contain milk.  2 hours before the procedure - stop drinking clear liquids. Medicine Ask your health care provider about:  Changing or stopping your regular medicines. This is especially important if you are taking diabetes medicines or blood thinners.  Taking medicines such as aspirin and ibuprofen. These medicines can thin your blood. Do not take these medicines before your procedure if your health care provider instructs you not to.  Tests and exams  You will have a physical exam.  You may have blood tests done to show: ? How well your kidneys and liver are working. ? How well your blood can clot. General instructions  Plan to have someone take you home from the hospital or clinic.  If you will be going home  right after the procedure, plan to have someone with you for 24 hours. What happens during the procedure?  An IV tube will be inserted into one of your veins.  Medicine to help you relax (sedative) will be given through the IV tube.  The medical or dental procedure will be performed. What happens after the procedure?  Your blood pressure, heart rate, breathing rate, and blood oxygen level will be monitored often until the medicines you were given have worn off.  Do not drive for 24 hours. This information is not intended to replace advice given to you by your health care provider. Make sure you discuss any questions you have with your health care provider. Document Revised: 11/16/2017 Document Reviewed: 03/25/2016 Elsevier Patient Education  2020 [employer].

## 2020-12-09 NOTE — H&P (Signed)
Chief Complaint: Patient was seen in consultation today for drain injection at the request of [place]  Supervising Physician: Markus Daft  Patient Status: [facility] - Out-pt  History of Present Illness: Rebecca Bell is a 19 y.o. 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 See note from clinic visit yesterday. Here for drain injection with possible exchange/upsize. PMHx, meds, labs, imaging, allergies reviewed. Feels well, no recent fevers, chills, illness. Has been NPO today as directed.   Past Medical History:  Diagnosis Date  . Abdominal pain   . Ovarian cyst     Past Surgical History:  Procedure Laterality Date  . COLONOSCOPY  2020  . IR RADIOLOGIST EVAL & MGMT  11/30/2020  . IR RADIOLOGIST EVAL & MGMT  12/08/2020  . IR SINUS/FIST TUBE CHK-NON GI  11/15/2020  . IR SINUS/FIST TUBE CHK-NON GI  11/15/2020  . IR SINUS/FIST TUBE CHK-NON GI  11/18/2020  . TIBIA FRACTURE SURGERY      Allergies: Patient has no known allergies.  Medications: Prior to Admission medications   Medication Sig Start Date End Date Taking? Authorizing Provider  oxyCODONE-acetaminophen (PERCOCET/ROXICET) 5-325 MG tablet Take 1-2 tablets by mouth every 6 (six) hours as needed. Patient taking differently: Take 1-2 tablets by mouth every 6 (six) hours as needed for moderate pain. 12/02/20  Yes Woodroe Mode, MD  amoxicillin-clavulanate (AUGMENTIN) 875-125 MG tablet Take 1 tablet by mouth 2 (two) times daily. Patient not taking: Reported on 12/08/2020 11/19/20   Woodroe Mode, MD  ibuprofen (ADVIL) 600 MG tablet Take 1 tablet (600 mg total) by mouth every 6 (six) hours as needed for cramping. 10/20/20   Constant, Peggy, MD     No family history on file.  Social History   Socioeconomic History  . Marital status: Single    Spouse name: Not on file  . Number of children: Not on file  . Years of education: Not on file  . Highest education level:  Not on file  Occupational History  . Occupation: Scientist, water quality  Tobacco Use  . Smoking status: Current Every Day Smoker  . Smokeless tobacco: Never Used  Substance and Sexual Activity  . Alcohol use: Yes    Comment: occ  . Drug use: Not on file    Comment: last time 1 month ago  . Sexual activity: Not on file  Other Topics Concern  . Not on file  Social History Narrative  . Not on file   Social Determinants of Health   Financial Resource Strain: Not on file  Food Insecurity: No Food Insecurity  . Worried About Charity fundraiser in the Last Year: Never true  . Ran Out of Food in the Last Year: Never true  Transportation Needs: No Transportation Needs  . Lack of Transportation (Medical): No  . Lack of Transportation (Non-Medical): No  Physical Activity: Not on file  Stress: Not on file  Social Connections: Not on file     Review of Systems: A 12 point ROS discussed and pertinent positives are indicated in the HPI above.  All other systems are negative.  Review of Systems  Vital Signs: BP (!) 92/59   Pulse (!) 139   Temp 98.8 F (37.1 C)   Resp 18   Ht 5' 5"  (1.651 m)   Wt 46.7 kg   SpO2 100%   BMI 17.14 kg/m   Physical Exam Constitutional:      Appearance: Normal appearance.  Cardiovascular:  Rate and Rhythm: Normal rate and regular rhythm.     Heart sounds: Normal heart sounds.  Pulmonary:     Effort: Pulmonary effort is normal. No respiratory distress.     Breath sounds: Normal breath sounds.  Abdominal:     Comments: TG drain intact, cloudy beige/brown output  Skin:    General: Skin is warm and dry.  Neurological:     General: No focal deficit present.     Mental Status: She is alert and oriented to person, place, and time.  Psychiatric:        Mood and Affect: Mood normal.        Thought Content: Thought content normal.        Judgment: Judgment normal.     Imaging: CT PELVIS W CONTRAST  Result Date: 11/18/2020 CLINICAL DATA:  Concern for  Crohn's colitis with history of bilateral tubo-ovarian abscess post CT-guided placement bilateral percutaneous drainage catheters. There is been little to no output from the right-sided percutaneous catheter for the past several days with minimal amount of residual purulent output left trans gluteal approach percutaneous catheter. EXAM: CT PELVIS WITH CONTRAST TECHNIQUE: Multidetector CT imaging of the pelvis was performed using the standard protocol following the bolus administration of intravenous contrast. CONTRAST:  22m OMNIPAQUE IOHEXOL 300 MG/ML  SOLN COMPARISON:  Pelvic CT-11/13/2020; 11/10/2020; 12/29/2019 CT guided pelvic cul-de-sac fluid collection aspiration-10/19/2020 CT guided right adnexal percutaneous drainage catheter placement-11/12/2020 CT guided left trans gluteal approach percutaneous drainage catheter placement-11/13/2020 FINDINGS: Urinary Tract: Normal appearance of the urinary bladder given degree distention. Bowel: Redemonstrated extensive inflammatory change of bowel wall thickening involving the terminal ileum associated upstream distension the small-bowel. Similar appearance suspected fistulous connection between distal small bowel and the left adnexa coronal images 38 and 41, series 6. Presumed secondary inflammatory change again is noted to involve the appendix. Redemonstrated mild wall thickening involving the distal aspect the sigmoid colon rectum. No evidence of perforation or definable/drainable collection. Vascular/Lymphatic: Pelvic vasculature appears patent and of normal caliber. Redemonstrated presumably reactive bilateral pelvic sidewall lymph nodes index left common iliac chain lymph node measuring 0.8 cm greatest short axis diameter (image 18 series 5), presumably reactive. Reproductive: Stable positioning bilateral adnexal drainage catheters with apparent complete resolution drain bilateral adnexal collections. There is a minimal amount free fluid in the pelvic cul-de-sac  without peripheral wall enhancement. No new definable/drainable collection within the pelvis. Other: Regional soft tissues appear normal. Musculoskeletal: No acute or aggressive osseous. IMPRESSION: 1. Interval complete resolution of bilateral adnexal collections following placement of bilateral percutaneous drainage catheters. No new definable/drainable fluid collections within pelvis. 2. Similar appearance of suspected fistulous connection between the distal small bowel and the left adnexa. 3. Redemonstrated extensive inflammatory change of the terminal ileum with associated upstream distension of the small-bowel again greater than would be expected for adjacent reactive inflammatory change again worrisome for inflammatory bowel disease such as Crohn's colitis. PLAN: Patient subsequently underwent percutaneous drainage injection. Electronically Signed   By: JSandi MariscalM.D.   On: 11/18/2020 18:03   CT PELVIS W CONTRAST  Result Date: 11/13/2020 CLINICAL DATA:  History of tubo-ovarian abscess post right-sided percutaneous drainage catheter placement. EXAM: CT PELVIS WITH CONTRAST TECHNIQUE: Multidetector CT imaging of the pelvis was performed using the standard protocol following the bolus administration of intravenous contrast. CONTRAST:  1082mOMNIPAQUE IOHEXOL 300 MG/ML  SOLN COMPARISON:  CT abdomen pelvis-11/10/2020; 10/18/2020; CT guided aspiration of pelvic fluid collection via left trans gluteal approach-10/19/2020. CT guided right anterior pelvic  percutaneous catheter placement-11/12/2020 FINDINGS: Urinary Tract: Normal appearance of the urinary bladder given degree distention. Bowel: There is ill-defined wall thickening and adjacent stranding involving multiple loops of distal small bowel (coronal images 16 through 46, series 5) with associated upstream distension of the small bowel. Redemonstrated potential fistulous connection between the terminal ileum and the inflammatory to process within the left  adnexa (coronal image 45, series 5). Inflammatory change now involves the tip of the appendix (images 35, 40 and 42), nonspecific though presumably reactive in etiology. No discrete definable/drainable fluid collections within the abdomen or pelvis. Vascular/Lymphatic: Pelvic vasculature appears patent. Presumably reactive pelvic lymph nodes with index left common iliac chain lymph node measuring 1 cm greatest short axis diameter (image 9, series 3). Reproductive: Interval reduction/near resolution of previously noted dominant fluid collection within the midline of the pelvis following right anterior approach percutaneous drainage catheter placement. Unchanged appearance of small (approximately 2.5 x 2.0 cm) right adnexal fluid collection (image 23, series 3), though the drainage catheter appears to traverse this more superficial collection. Stable appearance of the approximately 3.4 x 2.8 cm left-sided adnexal collection (image 61, series 3), unchanged compared to the 11/10/2020 examination. No new fluid collections within the abdomen or pelvis. Other:  Regional soft tissues appear normal. Musculoskeletal: No acute or aggressive osseous abnormalities. Normal appearance of the bilateral SI joints. IMPRESSION: 1. Redemonstrated extensive wall thickening and ill-defined stranding involving multiple loops of distal small bowel extending to the level of the pelvis,greater than would be expected for adjacent reactive inflammatory change, and while nonspecific could be seen in the setting of inflammatory bowel disease such as Crohn's colitis. Clinical correlation is advised. 2. Interval reduction/near resolution of previously noted dominant fluid collection within the midline of the pelvis following right anterior approach percutaneous drainage catheter placement. 3. Grossly unchanged small (approximately 2.5 cm) right adnexal fluid collection, though the drainage catheter appears to traverse this more superficial  collection. 4. Stable appearance of approximately 3.4 cm left-sided adnexal collection, again with suspected fistulous connection to the adjacent distal small bowel, unchanged compared to the 11/10/2020 examination. PLAN: Patient now will be arranged to undergo attempted CT-guided aspiration and/or drainage catheter placement into the residual left adnexal collection Electronically Signed   By: Sandi Mariscal M.D.   On: 11/13/2020 12:22   CT PELVIS W CONTRAST  Result Date: 11/10/2020 CLINICAL DATA:  Tubo-ovarian abscess EXAM: CT PELVIS WITH CONTRAST TECHNIQUE: Multidetector CT imaging of the pelvis was performed using the standard protocol following the bolus administration of intravenous contrast. CONTRAST:  127m OMNIPAQUE IOHEXOL 300 MG/ML  SOLN COMPARISON:  10/18/2020 FINDINGS: Urinary Tract: Urinary bladder is under distended limiting assessment. Less distended than on the prior exam. No distal ureteral dilation. Bowel: Diffuse mural stratification and irregular thickening of small bowel loops in the pelvis. Complex fistulous network involving the distal ileum with tract extending from the distal ileum into the LEFT adnexa where there is a peripherally enhancing fluid containing structure that is similar to the prior study. This area measures approximately 3.2 x 2.4 cm (image 19 of series 5. Displacing bowel loops in the pelvis is a 12 x 6.8 cm area of fluid density with peripheral septation that has enlarged since the prior study where it measured 10 x 6 cm. Pseudo sacculation along the distal ileum is present with transmural fistulae and overt fistulization to LEFT adnexa and with presumed Fischel ization also to the sigmoid colon, LEFT adnexal component best seen on image 67 of series 7. Enterocolonic  fistula is a shin to sigmoid best seen on sagittal image 78 of series 8. Fistulization of LEFT adnexa can be followed on images 94-103 as well into the LEFT adnexa. Extensive stranding about the pelvis with  similar appearance to prior imaging. No free air to exhibited in the lower abdomen/upper pelvis. Potential fistulization as well to the appendix which is at the same level of potentials fistulization to the sigmoid colon. Upstream bowel loops are dilated with similar pattern to previous imaging. Vascular/Lymphatic: Patent pelvic vasculature grossly. Venous structures not well assessed. No pelvic lymphadenopathy. Reproductive: Uterus displaced from fluid collection in the RIGHT pelvis. Other: Mesenteric stranding in the low abdomen. This finding is similar to the previous imaging study. Musculoskeletal: Subtle sclerosis in the subchondral region of the bilateral femoral heads raising the question of early avascular necrosis. IMPRESSION: 1. Complex fistulous network in the pelvis leading to abscess in the LEFT adnexa in the setting of presumed inflammatory bowel disease, most suggestive of Crohn's disease. Correlate with patient history for repeated episodes of abdominal pain. 2. Enlarging more simple appearing fluid in the pelvis in the RIGHT pelvis displacing structures from RIGHT to LEFT likely a peritoneal inclusion cyst in the setting of repeated inflammation based on the appearance of small bowel loops. This could also represent a hydrosalpinx. Correlate with fluid sampling which was performed on October 19, 2020 possibility of interval super infection is considered though the appearance while enlarged is otherwise similar to previous imaging. The 3. Upstream bowel loops are dilated likely related to partial obstruction but unchanged from previous exam. 4. Subtle sclerosis in the subchondral region of the bilateral femoral heads raising the question of early avascular necrosis. These results will be called to the ordering clinician or representative by the Radiologist Assistant, and communication documented in the PACS or [employer]. Electronically Signed   By: Zetta Bills M.D.   On: 11/10/2020 17:56    IR Sinus/Fist Tube Chk-Non GI  Result Date: 11/18/2020 CLINICAL DATA:  Concern for Crohn's colitis with history of bilateral tubo-ovarian abscess post CT-guided placement bilateral percutaneous drainage catheters. There is been little to no output from the right-sided percutaneous catheter for the past several days with minimal amount of residual purulent output left trans gluteal approach percutaneous catheter. Preceding pelvic CT demonstrates complete resolution bilateral adnexal collections. As such patient for fluoroscopic guided drainage catheter injection prior to potential removal. EXAM: SINUS TRACT INJECTION/FISTULOGRAM COMPARISON:  Pelvic CT-earlier same day; 11/13/2020; 11/10/2020; 12/29/2019 Fluoroscopic guided drainage catheter injection-11/15/2020 CT guided pelvic cul-de-sac fluid collection aspiration-10/19/2020 CT guided right adnexal percutaneous drainage catheter placement-11/12/2020 CT guided left trans gluteal approach percutaneous drainage catheter placement-11/13/2020 CONTRAST:  10 cc Omnipaque 300-administered via the existing percutaneous drain. FLUOROSCOPY TIME:  1 minute, 30 seconds (36 mGy) TECHNIQUE: The patient was positioned supine on the fluoroscopy table. A preprocedural spot fluoroscopic image was obtained of the lower pelvis and the existing bilateral adnexal drainage catheters. Attention was first paid towards the left trans gluteal approach percutaneous drainage catheter. Small amount of contrast was injected as multiple spot fluoroscopic and radiographic images were obtained various obliquities. Next, small amount of contrast was injected via the right anterior approach percutaneous drainage catheter. Images reviewed and discussed patient. The decision was made to remove the right anterior lower abdominal/pelvic drainage catheter. The external portion the drainage catheter was cut and drainage catheters removed intact. Dressing was applied. The remaining left trans gluteal  approach percutaneous drainage catheter was flushed with a small saline converted from a  JP bulb to a gravity bag. A dressing applied. The patient tolerated the above procedures well without immediate postprocedural complication. FINDINGS: Preprocedural spot fluoroscopic image demonstrates unchanged positioning bilateral percutaneous drainage catheters with end coiled and locked over the bilateral adnexa. Excreted contrast is seen within bladder Contrast injection of the left trans gluteal approach percutaneous drainage catheter demonstrates opacification the decompressed left adnexal abscess cavity with communication between the decompressed abscess cavity and the adjacent dysmorphic appearing distal small bowel as was suggested on preceding pelvic CT. Contrast injection of the right anterior approach drainage catheter demonstrates opacification of the residual right-sided adnexal abscess cavity as well as spillage of contrast into the right hemipelvis. There is no definitive evidence of fistulous connection between the right-sided drainage catheter and intestines. IMPRESSION: 1. Contrast injection confirms fistulous connection between the left trans gluteal approach adnexal drainage catheter and the distal small bowel, as was suggested on preceding pelvic CT. 2. Contrast injection of the right anterior approach lower abdominal/pelvic drainage catheter is negative for definitive fistulous connection. As such, this drainage catheter was removed at the patient's bedside without incident. PLAN: - patient's remaining left trans gluteal approach drainage catheter was converted from a JP bulb to a gravity bag. The patient was instructed to no longer flush the drainage catheter but to maintain diligent records regarding daily drainage catheter output. - The patient may follow-up at the interventional radiology drain clinic the week of December 13 with drainage catheter injection (no CT is necessary given resolution  bilateral adnexal abscesses on today's CT and in hopes of limiting the patient's radiation exposure). - Again, recommend surgical and gastroenterology consultation given concern the above process is secondary to inflammatory bowel disease (such as Crohn's colitis) as opposed to a gynecologic infection. Electronically Signed   By: Sandi Mariscal M.D.   On: 11/18/2020 18:12   IR Sinus/Fist Tube Chk-Non GI  Result Date: 11/15/2020 INDICATION: Crohn's disease, left tubo-ovarian abscess, pelvic pain, status post 2 percutaneous drain EXAM: Fluoroscopic injection of both pelvic drains MEDICATIONS: The patient is currently admitted to the hospital and receiving intravenous antibiotics. The antibiotics were administered within an appropriate time frame prior to the initiation of the procedure. ANESTHESIA/SEDATION: Moderate Sedation Time:  None. The patient was continuously monitored during the procedure by the interventional radiology nurse under my direct supervision. COMPLICATIONS: None immediate. PROCEDURE: Informed written consent was obtained from the patient after a thorough discussion of the procedural risks, benefits and alternatives. All questions were addressed. Maximal Sterile Barrier Technique was utilized including caps, mask, sterile gowns, sterile gloves, sterile drape, hand hygiene and skin antiseptic. A timeout was performed prior to the initiation of the procedure. Anterior right pelvic drain injection: Contrast fills a partially collapsed dependent abscess cavity within the pelvis which correlates with the prior CT. No communication to adjacent bowel or adnexa. Left transgluteal pelvic drain injection: Stable drain catheter position. Abscess cavity collapsed. There are complex sinus tracks extending from the drain catheter site but without any clear communication or fistula to adjacent bowel. IMPRESSION: Stable drain catheter positions. Residual abscess cavities remain at both drain catheter sites. No  clear communication to adjacent bowel or adnexa. Electronically Signed   By: Jerilynn Mages.  Shick M.D.   On: 11/15/2020 18:29   IR Sinus/Fist Tube Chk-Non GI  Result Date: 11/15/2020 INDICATION: Crohn's disease, left tubo-ovarian abscess, pelvic pain, status post 2 percutaneous drain EXAM: Fluoroscopic injection of both pelvic drains MEDICATIONS: The patient is currently admitted to the hospital and receiving intravenous antibiotics. The  antibiotics were administered within an appropriate time frame prior to the initiation of the procedure. ANESTHESIA/SEDATION: Moderate Sedation Time:  None. The patient was continuously monitored during the procedure by the interventional radiology nurse under my direct supervision. COMPLICATIONS: None immediate. PROCEDURE: Informed written consent was obtained from the patient after a thorough discussion of the procedural risks, benefits and alternatives. All questions were addressed. Maximal Sterile Barrier Technique was utilized including caps, mask, sterile gowns, sterile gloves, sterile drape, hand hygiene and skin antiseptic. A timeout was performed prior to the initiation of the procedure. Anterior right pelvic drain injection: Contrast fills a partially collapsed dependent abscess cavity within the pelvis which correlates with the prior CT. No communication to adjacent bowel or adnexa. Left transgluteal pelvic drain injection: Stable drain catheter position. Abscess cavity collapsed. There are complex sinus tracks extending from the drain catheter site but without any clear communication or fistula to adjacent bowel. IMPRESSION: Stable drain catheter positions. Residual abscess cavities remain at both drain catheter sites. No clear communication to adjacent bowel or adnexa. Electronically Signed   By: Jerilynn Mages.  Shick M.D.   On: 11/15/2020 18:29   DG Sinus/Fist Tube Chk-Non GI  Result Date: 11/30/2020 CLINICAL DATA:  TOA (tubo-ovarian abscess), possible Crohn's colitis, status post  percutaneous drainage x2 11/26-27/21. The right-sided catheter was removed 11/18/2020, but fistula to the bowel was demonstrated from left transgluteal catheter at that time. There is persistent output approximately 30 mL per day from the left transgluteal catheter. EXAM: ABSCESS DRAIN CATHETER INJECTION UNDER FLUOROSCOPY TECHNIQUE: The procedure, risks (including but not limited to bleeding, infection, organ damage), benefits, and alternatives were explained to the patient. Questions regarding the procedure were encouraged and answered. The patient understands and consents to the procedure. Survey fluoroscopic inspection reveals stable position of left trans gluteal pigtail drain catheter. Injection demonstrates small residual linear abscess cavity versus contrast in fallopian tube. There is persistent fistula however to bowel anterior to the abscess cavity, unclear if this represents small bowel or sigmoid colon. IMPRESSION: 1. 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 The drain catheter will be left in place. Patient is scheduled for surgical consultation. Electronically Signed   By: Lucrezia Europe M.D.   On: 11/30/2020 11:05   DG Abd Portable 1V  Result Date: 11/15/2020 CLINICAL DATA:  Nausea. Abdominal pain. History of tubo-ovarian abscesses. Patient has bilateral pelvic drains. EXAM: PORTABLE ABDOMEN - 1 VIEW COMPARISON:  Pelvic CT 11/13/2020 FINDINGS: Bilateral pigtail drains in the pelvis. Gas-filled structure in the central and right side of the pelvis is indeterminate but likely represents colon. Overall, nonobstructive bowel gas pattern. Evidence for a navel ring. IMPRESSION: 1. Gas-filled structure in the pelvis likely represents colon. Overall, nonobstructive bowel gas pattern. 2. Bilateral pelvic drains. Electronically Signed   By: Markus Daft M.D.   On: 11/15/2020 08:52   CT IMAGE GUIDED FLUID DRAIN BY CATHETER  Result Date: 11/13/2020 INDICATION: 19 year old female with history of left tubo-ovarian abscess,  large pelvic fluid collection, and suspected Crohn's with left adnexal to small-bowel fistula. Status post anterior right-sided pelvic drain and fluid evacuation on 11/12/2020. Presents for left tubo-ovarian abscess drainage after right-sided pelvic fluid evacuation. EXAM: CT IMAGE GUIDED FLUID DRAIN BY CATHETER COMPARISON:  CT abdomen pelvis from earlier the same day MEDICATIONS: The patient is currently admitted to the hospital and receiving intravenous antibiotics. The antibiotics were administered within an appropriate time frame prior to the initiation of the procedure. ANESTHESIA/SEDATION: Moderate (conscious) sedation was employed during this procedure. A total of Versed  3 mg and Fentanyl 150 mcg was administered intravenously. Moderate Sedation Time: 22 minutes. The patient's level of consciousness and vital signs were monitored continuously by radiology nursing throughout the procedure under my direct supervision. CONTRAST:  None COMPLICATIONS: None immediate. PROCEDURE: Informed written consent was obtained from the patient after a discussion of the risks, benefits and alternatives to treatment. The patient was placed prone in a semi jack-knife position on the CT gantry and a pre procedural CT was performed re-demonstrating the known abscess/fluid collection within the left adnexa. The procedure was planned. A timeout was performed prior to the initiation of the procedure. The left superior buttock was prepped and draped in the usual sterile fashion. The overlying soft tissues were anesthetized with 1% lidocaine with epinephrine. Appropriate trajectory was planned with the use of a 22 gauge spinal needle. An 18 gauge trocar needle was advanced into the abscess/fluid collection and a short Amplatz super stiff wire was coiled within the collection. Appropriate positioning was confirmed with a limited CT scan. The tract was serially dilated allowing placement of a 10.2 [place] all-purpose drainage catheter.  Appropriate positioning was confirmed with a limited postprocedural CT scan. Approximately 30 ml of purulent fluid was aspirated. The tube was connected to a drainage bag and sutured in place. A dressing was placed. The patient tolerated the procedure well without immediate post procedural complication. IMPRESSION: Successful CT guided placement of a 10.2 [place] all purpose drain catheter into the left adnexa with aspiration of 30 mL of purulent fluid. Samples were sent to the laboratory as requested by the ordering clinical team. Ruthann Cancer, MD [facility] Radiology Electronically Signed   By: Ruthann Cancer MD   On: 11/13/2020 19:29   CT IMAGE GUIDED DRAINAGE BY PERCUTANEOUS CATHETER  Result Date: 11/12/2020 INDICATION: 19 year old female with presumed tubo-ovarian abscess status post left adnexal fluid collection aspiration with sterile results on 10/19/2020. Presents with worsening pelvic pain and concern for tubo-ovarian abscess with possible visualization in the setting of Crohn's disease. EXAM: CT IMAGE GUIDED DRAINAGE BY PERCUTANEOUS CATHETER COMPARISON:  CT pelvis from 10/18/2020, CT-guided aspiration from 10/19/2020, ultrasound pelvis from 11/08/2020, and CT pelvis from 11/10/2020 MEDICATIONS: The patient is currently admitted to the hospital and receiving intravenous antibiotics. The antibiotics were administered within an appropriate time frame prior to the initiation of the procedure. ANESTHESIA/SEDATION: Moderate (conscious) sedation was employed during this procedure. A total of Versed 2 mg and Fentanyl 100 mcg was administered intravenously. Moderate Sedation Time: 31 minutes. The patient's level of consciousness and vital signs were monitored continuously by radiology nursing throughout the procedure under my direct supervision. CONTRAST:  None COMPLICATIONS: None immediate. PROCEDURE: Informed written consent was obtained from the patient after  a discussion of the risks, benefits and alternatives to treatment. The patient was placed supine on the CT gantry and a pre procedural CT was performed re-demonstrating the known abscess/fluid collection within the right pelvis. The procedure was planned. A timeout was performed prior to the initiation of the procedure. The right anterior pelvis was prepped and draped in the usual sterile fashion. The overlying soft tissues were anesthetized with 1% lidocaine with epinephrine. Appropriate trajectory was planned with the use of a 22 gauge spinal needle. An 18 gauge trocar needle was advanced into the abscess/fluid collection and a short Amplatz super stiff wire was coiled within the collection. Appropriate positioning was confirmed with a limited CT scan. The tract was serially dilated allowing placement of a 10 [place] all-purpose  drainage catheter. Appropriate positioning was confirmed with a limited postprocedural CT scan. Approximately 250 mL ml of translucent, straw-colored fluid was aspirated. The tube was connected to a drainage bag and sutured in place. A dressing was placed. The patient tolerated the procedure well without immediate post procedural complication. IMPRESSION: Successful CT guided placement of a 10 French all purpose drain catheter into the right pelvic fluid collection with aspiration of approximately 250 mL of serous fluid. Samples were sent for cytology as well as culture. PLAN: Obtain repeat CT pelvis with contrast in approximately 24 hours. If there is persistence of and safe window of approach for left adnexal fluid collection, additional percutaneous procedure could be considered. Ruthann Cancer, MD [facility] Radiology Electronically Signed   By: Ruthann Cancer MD   On: 11/12/2020 13:04   IR Radiologist Eval & Mgmt  Result Date: 12/08/2020 Please refer to notes tab for details about interventional procedure. (Op Note)  IR Radiologist  Eval & Mgmt  Result Date: 11/30/2020 Please refer to notes tab for details about interventional procedure. (Op Note)   Labs:  CBC: Recent Labs    11/04/20 1432 11/10/20 1453 11/14/20 0811 11/18/20 1107  WBC 12.5* 12.3* 13.5* 11.6*  HGB 10.5* 10.0* 9.8* 9.4*  HCT 35.4* 34.2* 33.1* 32.1*  PLT 734* 699* 671* 675*    COAGS: Recent Labs    10/18/20 1445 11/10/20 1453  INR 1.1 1.2  APTT  --  38*    BMP: Recent Labs    10/13/20 0943 10/14/20 1501 11/14/20 0811  NA 136 134* 133*  K 3.3* 3.3* 3.7  CL 100 98 95*  CO2 25 26 28   GLUCOSE 102* 92 103*  BUN 16 10 5*  CALCIUM 9.1 8.6* 9.1  CREATININE 0.86 0.72 0.88  GFRNONAA >60 >60 >60    LIVER FUNCTION TESTS: Recent Labs    10/13/20 0943 11/14/20 0811  BILITOT 0.2* 0.5  AST 16 11*  ALT 11 8  ALKPHOS 65 42  PROT 8.8* 7.2  ALBUMIN 3.4* 2.6*    TUMOR MARKERS: No results for input(s): AFPTM, CEA, CA199, CHROMGRNA in the last 8760 hours.  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  All of the patient's questions were answered, patient is agreeable to proceed. Consent signed and in chart.    Thank you for this interesting consult.  I greatly enjoyed meeting [facility] and look forward to participating in their care.  A copy of this report was sent to the requesting provider on this date.  Electronically Signed: Ascencion Dike, PA-C 12/09/2020, 10:12 AM

## 2020-12-09 NOTE — Sedation Documentation (Addendum)
Dr Anselm Pancoast of [facility] in Short Stay, HR 139, BP 86/58 Temp 98.9. PA A Louk also aware of these VS at 0900 and will let P Turpin know.

## 2020-12-09 NOTE — Progress Notes (Signed)
Patient was given discharge instructions. She verbalized understanding. 

## 2020-12-09 NOTE — Sedation Documentation (Signed)
Dr Anselm Pancoast in to see patient and pulse currently 90's

## 2020-12-09 NOTE — Procedures (Signed)
Interventional Radiology Procedure:   Indications: History of pelvic abscess with drain and known bowel fistula.  Leaking around drain.   Procedure: Drain injection and drain exchange  Findings: Small pelvic cavity with bowel fistula.  New 10 Fr Dawson-Mueller drain placed.  No leakage around drain or skin after new drain placement.   Complications: None     EBL: less than 10 ml  Plan: Keep drain to gravity, no flushing.  Follow up to drain clinic in 2 weeks.  If drain continues to leak, will need to repeat CT and look for additional collections.    Josia Cueva R. Anselm Pancoast, MD  Pager: [phone]

## 2020-12-14 ENCOUNTER — Other Ambulatory Visit (HOSPITAL_COMMUNITY): Payer: Self-pay | Admitting: Physician Assistant

## 2020-12-14 ENCOUNTER — Telehealth (HOSPITAL_COMMUNITY): Payer: Self-pay | Admitting: Physician Assistant

## 2020-12-14 DIAGNOSIS — N7093 Salpingitis and oophoritis, unspecified: Secondary | ICD-10-CM

## 2020-12-14 NOTE — Telephone Encounter (Signed)
Patient placed call to IR at [facility] reporting drain is again leaking at insertion site and there is no output in the bag for over 24 hours. She reports that after recent drain exchange on 12/23 there was significant output for several days with no leakage around the drain site, however yesterday she noticed that there is again no output and leakage around the insertion site. She is wondering if the drain needs to be evaluated again so it does not worsen. She denies any fevers, chills, n/v, abdominal pain.  Will plan for patient to be seen for drain injection/evaluation this week as schedule permits. Patient instructed to continue current drain management/monitor output - if she develops persistent n/v or fevers she should present to the ED for further evaluation.  Candiss Norse, PA-C

## 2020-12-15 ENCOUNTER — Ambulatory Visit: Admit: 2020-12-15 | Discharge: 2020-12-16

## 2020-12-15 DIAGNOSIS — K50918 Crohn's disease, unspecified, with other complication: Principal | ICD-10-CM

## 2020-12-15 DIAGNOSIS — N7093 Salpingitis and oophoritis, unspecified: Principal | ICD-10-CM

## 2020-12-15 DIAGNOSIS — N5089 Other specified disorders of the male genital organs: Principal | ICD-10-CM

## 2020-12-16 ENCOUNTER — Other Ambulatory Visit: Payer: Self-pay

## 2020-12-16 ENCOUNTER — Ambulatory Visit (HOSPITAL_COMMUNITY)
Admission: RE | Admit: 2020-12-16 | Discharge: 2020-12-16 | Disposition: A | Discharge status: Home / Self Care | Payer: Self-pay | Source: Ambulatory Visit | Attending: Physician Assistant | Admitting: Physician Assistant

## 2020-12-16 ENCOUNTER — Ambulatory Visit (HOSPITAL_COMMUNITY)
Admission: RE | Admit: 2020-12-16 | Discharge: 2020-12-16 | Disposition: A | Discharge status: Home / Self Care | Payer: Self-pay | Source: Ambulatory Visit | Attending: Student | Admitting: Student

## 2020-12-16 ENCOUNTER — Other Ambulatory Visit (HOSPITAL_COMMUNITY): Payer: Self-pay | Admitting: Student

## 2020-12-16 DIAGNOSIS — N7093 Salpingitis and oophoritis, unspecified: Secondary | ICD-10-CM

## 2020-12-16 DIAGNOSIS — Z4803 Encounter for change or removal of drains: Secondary | ICD-10-CM | POA: Insufficient documentation

## 2020-12-16 DIAGNOSIS — L0291 Cutaneous abscess, unspecified: Secondary | ICD-10-CM

## 2020-12-16 HISTORY — PX: IR SINUS/FIST TUBE CHK-NON GI: IMG673

## 2020-12-16 MED ORDER — IOHEXOL 300 MG/ML  SOLN
100.0000 mL | Freq: Once | INTRAMUSCULAR | Status: AC | PRN
  Administered 2020-12-16: 15:00:00 100 mL via INTRAVENOUS

## 2020-12-16 MED ORDER — IOHEXOL 300 MG/ML  SOLN
50.0000 mL | Freq: Once | INTRAMUSCULAR | Status: AC | PRN
  Administered 2020-12-16: 10 mL

## 2020-12-16 MED ORDER — AMOXICILLIN-POT CLAVULANATE 875-125 MG PO TABS: 1.0000 | ORAL_TABLET | Freq: Two times a day (BID) | ORAL | 0 refills | Status: DC

## 2020-12-16 MED FILL — AMOX-CLAV 875-125 MG TABLET: 875-125 | 10 days supply | Qty: 20 | Fill #0

## 2020-12-16 NOTE — Progress Notes (Signed)
Chief Complaint: Patient was seen in consultation today for intra-abdominal fluid collection  Referring Physician(s): Dr. Emeterio Reeve Dr. Serita Grammes  History of Present Illness: Rebecca Bell is a 19 y.o. female who was initially seen in hospital for complex abscesses in the pelvis, with potentially mixed etiology of TOA vs possible inflammatory bowel disease. She had 2 separate abscess drains placed with the RLQ drain (anterior drain placed 11/27 which has since removed and L TG drain placed 11/27 which remains in place). On her final imaging prior to discharge, drain injection confirmed communication to the bowel. It was recommended she follow up with both surgical and GI services in addition to IR drain clinic.  Ms. Mellin was seen in routine follow-up 11/30/20 for drain evaluation and management at which time drain injection showed a persistent fistulous connection. He was having increased abdominal pain on the day of evaluation and deferred dressing change to be done at home with help of her sister. Ms. Esteve called the office 12/06/20 to report intermittent drainage of pus from her site. She was scheduled for a site check and dressing change today.   Axel Delgadillo presents to [facility] today for drain evaluation.  At her most recent exchange with Dr. Anselm Pancoast, recommendation was made for additional imaging if she continues to have difficulty with leakage or poor drainage. She reports recent increased drainage after her most recent exchange on 12/23 that lasted for several days, however this has slowed significantly and she is now having increased drainage around the catheter again.  She denies fever, chills, nausea, vomiting, abdominal pain.   Past Medical History:  Diagnosis Date  . Abdominal pain   . Ovarian cyst     Past Surgical History:  Procedure Laterality Date  . COLONOSCOPY  2020  . IR CATHETER TUBE CHANGE  12/09/2020  . IR RADIOLOGIST EVAL & MGMT  11/30/2020  .  IR RADIOLOGIST EVAL & MGMT  12/08/2020  . IR SINUS/FIST TUBE CHK-NON GI  11/15/2020  . IR SINUS/FIST TUBE CHK-NON GI  11/15/2020  . IR SINUS/FIST TUBE CHK-NON GI  11/18/2020  . TIBIA FRACTURE SURGERY      Allergies: Patient has no known allergies.  Medications: Prior to Admission medications   Medication Sig Start Date End Date Taking? Authorizing Provider  amoxicillin-clavulanate (AUGMENTIN) 875-125 MG tablet Take 1 tablet by mouth 2 (two) times daily. 11/19/20   Woodroe Mode, MD  ibuprofen (ADVIL) 600 MG tablet Take 1 tablet (600 mg total) by mouth every 6 (six) hours as needed for cramping. 10/20/20   Constant, Peggy, MD  oxyCODONE-acetaminophen (PERCOCET/ROXICET) 5-325 MG tablet Take 1-2 tablets by mouth every 6 (six) hours as needed. 11/19/20   Woodroe Mode, MD     No family history on file.  Social History   Socioeconomic History  . Marital status: Single    Spouse name: Not on file  . Number of children: Not on file  . Years of education: Not on file  . Highest education level: Not on file  Occupational History  . Occupation: Scientist, water quality  Tobacco Use  . Smoking status: Current Every Day Smoker  . Smokeless tobacco: Never Used  Substance and Sexual Activity  . Alcohol use: Yes    Comment: occ  . Drug use: Not on file    Comment: last time 1 month ago  . Sexual activity: Not on file  Other Topics Concern  . Not on file  Social History Narrative  . Not on  file   Social Determinants of Health   Financial Resource Strain: Not on file  Food Insecurity: No Food Insecurity  . Worried About Charity fundraiser in the Last Year: Never true  . Ran Out of Food in the Last Year: Never true  Transportation Needs: No Transportation Needs  . Lack of Transportation (Medical): No  . Lack of Transportation (Non-Medical): No  Physical Activity: Not on file  Stress: Not on file  Social Connections: Not on file     Review of Systems: A 12 point ROS discussed and pertinent  positives are indicated in the HPI above.  All other systems are negative.  Review of Systems  Vital Signs: There were no vitals taken for this visit. NAD, alert Abdomen: soft, non-tender, Left TG drain intact but with copious amount of purulent material leaking around skin and saturating gauze/towel. Very small amount of drainage in gravity bag.   Imaging: CT PELVIS W CONTRAST  Result Date: 11/18/2020 CLINICAL DATA:  Concern for Crohn's colitis with history of bilateral tubo-ovarian abscess post CT-guided placement bilateral percutaneous drainage catheters. There is been little to no output from the right-sided percutaneous catheter for the past several days with minimal amount of residual purulent output left trans gluteal approach percutaneous catheter. EXAM: CT PELVIS WITH CONTRAST TECHNIQUE: Multidetector CT imaging of the pelvis was performed using the standard protocol following the bolus administration of intravenous contrast. CONTRAST:  42m OMNIPAQUE IOHEXOL 300 MG/ML  SOLN COMPARISON:  Pelvic CT-11/13/2020; 11/10/2020; 12/29/2019 CT guided pelvic cul-de-sac fluid collection aspiration-10/19/2020 CT guided right adnexal percutaneous drainage catheter placement-11/12/2020 CT guided left trans gluteal approach percutaneous drainage catheter placement-11/13/2020 FINDINGS: Urinary Tract: Normal appearance of the urinary bladder given degree distention. Bowel: Redemonstrated extensive inflammatory change of bowel wall thickening involving the terminal ileum associated upstream distension the small-bowel. Similar appearance suspected fistulous connection between distal small bowel and the left adnexa coronal images 38 and 41, series 6. Presumed secondary inflammatory change again is noted to involve the appendix. Redemonstrated mild wall thickening involving the distal aspect the sigmoid colon rectum. No evidence of perforation or definable/drainable collection. Vascular/Lymphatic: Pelvic vasculature  appears patent and of normal caliber. Redemonstrated presumably reactive bilateral pelvic sidewall lymph nodes index left common iliac chain lymph node measuring 0.8 cm greatest short axis diameter (image 18 series 5), presumably reactive. Reproductive: Stable positioning bilateral adnexal drainage catheters with apparent complete resolution drain bilateral adnexal collections. There is a minimal amount free fluid in the pelvic cul-de-sac without peripheral wall enhancement. No new definable/drainable collection within the pelvis. Other: Regional soft tissues appear normal. Musculoskeletal: No acute or aggressive osseous. IMPRESSION: 1. Interval complete resolution of bilateral adnexal collections following placement of bilateral percutaneous drainage catheters. No new definable/drainable fluid collections within pelvis. 2. Similar appearance of suspected fistulous connection between the distal small bowel and the left adnexa. 3. Redemonstrated extensive inflammatory change of the terminal ileum with associated upstream distension of the small-bowel again greater than would be expected for adjacent reactive inflammatory change again worrisome for inflammatory bowel disease such as Crohn's colitis. PLAN: Patient subsequently underwent percutaneous drainage injection. Electronically Signed   By: JSandi MariscalM.D.   On: 11/18/2020 18:03   CT PELVIS W CONTRAST  Result Date: 11/13/2020 CLINICAL DATA:  History of tubo-ovarian abscess post right-sided percutaneous drainage catheter placement. EXAM: CT PELVIS WITH CONTRAST TECHNIQUE: Multidetector CT imaging of the pelvis was performed using the standard protocol following the bolus administration of intravenous contrast. CONTRAST:  10106mOMNIPAQUE IOHEXOL  300 MG/ML  SOLN COMPARISON:  CT abdomen pelvis-11/10/2020; 10/18/2020; CT guided aspiration of pelvic fluid collection via left trans gluteal approach-10/19/2020. CT guided right anterior pelvic percutaneous catheter  placement-11/12/2020 FINDINGS: Urinary Tract: Normal appearance of the urinary bladder given degree distention. Bowel: There is ill-defined wall thickening and adjacent stranding involving multiple loops of distal small bowel (coronal images 16 through 46, series 5) with associated upstream distension of the small bowel. Redemonstrated potential fistulous connection between the terminal ileum and the inflammatory to process within the left adnexa (coronal image 45, series 5). Inflammatory change now involves the tip of the appendix (images 35, 40 and 42), nonspecific though presumably reactive in etiology. No discrete definable/drainable fluid collections within the abdomen or pelvis. Vascular/Lymphatic: Pelvic vasculature appears patent. Presumably reactive pelvic lymph nodes with index left common iliac chain lymph node measuring 1 cm greatest short axis diameter (image 9, series 3). Reproductive: Interval reduction/near resolution of previously noted dominant fluid collection within the midline of the pelvis following right anterior approach percutaneous drainage catheter placement. Unchanged appearance of small (approximately 2.5 x 2.0 cm) right adnexal fluid collection (image 23, series 3), though the drainage catheter appears to traverse this more superficial collection. Stable appearance of the approximately 3.4 x 2.8 cm left-sided adnexal collection (image 61, series 3), unchanged compared to the 11/10/2020 examination. No new fluid collections within the abdomen or pelvis. Other:  Regional soft tissues appear normal. Musculoskeletal: No acute or aggressive osseous abnormalities. Normal appearance of the bilateral SI joints. IMPRESSION: 1. Redemonstrated extensive wall thickening and ill-defined stranding involving multiple loops of distal small bowel extending to the level of the pelvis,greater than would be expected for adjacent reactive inflammatory change, and while nonspecific could be seen in the  setting of inflammatory bowel disease such as Crohn's colitis. Clinical correlation is advised. 2. Interval reduction/near resolution of previously noted dominant fluid collection within the midline of the pelvis following right anterior approach percutaneous drainage catheter placement. 3. Grossly unchanged small (approximately 2.5 cm) right adnexal fluid collection, though the drainage catheter appears to traverse this more superficial collection. 4. Stable appearance of approximately 3.4 cm left-sided adnexal collection, again with suspected fistulous connection to the adjacent distal small bowel, unchanged compared to the 11/10/2020 examination. PLAN: Patient now will be arranged to undergo attempted CT-guided aspiration and/or drainage catheter placement into the residual left adnexal collection Electronically Signed   By: Sandi Mariscal M.D.   On: 11/13/2020 12:22   IR Sinus/Fist Tube Chk-Non GI  Result Date: 11/18/2020 CLINICAL DATA:  Concern for Crohn's colitis with history of bilateral tubo-ovarian abscess post CT-guided placement bilateral percutaneous drainage catheters. There is been little to no output from the right-sided percutaneous catheter for the past several days with minimal amount of residual purulent output left trans gluteal approach percutaneous catheter. Preceding pelvic CT demonstrates complete resolution bilateral adnexal collections. As such patient for fluoroscopic guided drainage catheter injection prior to potential removal. EXAM: SINUS TRACT INJECTION/FISTULOGRAM COMPARISON:  Pelvic CT-earlier same day; 11/13/2020; 11/10/2020; 12/29/2019 Fluoroscopic guided drainage catheter injection-11/15/2020 CT guided pelvic cul-de-sac fluid collection aspiration-10/19/2020 CT guided right adnexal percutaneous drainage catheter placement-11/12/2020 CT guided left trans gluteal approach percutaneous drainage catheter placement-11/13/2020 CONTRAST:  10 cc Omnipaque 300-administered via the  existing percutaneous drain. FLUOROSCOPY TIME:  1 minute, 30 seconds (36 mGy) TECHNIQUE: The patient was positioned supine on the fluoroscopy table. A preprocedural spot fluoroscopic image was obtained of the lower pelvis and the existing bilateral adnexal drainage catheters. Attention was first paid  towards the left trans gluteal approach percutaneous drainage catheter. Small amount of contrast was injected as multiple spot fluoroscopic and radiographic images were obtained various obliquities. Next, small amount of contrast was injected via the right anterior approach percutaneous drainage catheter. Images reviewed and discussed patient. The decision was made to remove the right anterior lower abdominal/pelvic drainage catheter. The external portion the drainage catheter was cut and drainage catheters removed intact. Dressing was applied. The remaining left trans gluteal approach percutaneous drainage catheter was flushed with a small saline converted from a JP bulb to a gravity bag. A dressing applied. The patient tolerated the above procedures well without immediate postprocedural complication. FINDINGS: Preprocedural spot fluoroscopic image demonstrates unchanged positioning bilateral percutaneous drainage catheters with end coiled and locked over the bilateral adnexa. Excreted contrast is seen within bladder Contrast injection of the left trans gluteal approach percutaneous drainage catheter demonstrates opacification the decompressed left adnexal abscess cavity with communication between the decompressed abscess cavity and the adjacent dysmorphic appearing distal small bowel as was suggested on preceding pelvic CT. Contrast injection of the right anterior approach drainage catheter demonstrates opacification of the residual right-sided adnexal abscess cavity as well as spillage of contrast into the right hemipelvis. There is no definitive evidence of fistulous connection between the right-sided drainage  catheter and intestines. IMPRESSION: 1. Contrast injection confirms fistulous connection between the left trans gluteal approach adnexal drainage catheter and the distal small bowel, as was suggested on preceding pelvic CT. 2. Contrast injection of the right anterior approach lower abdominal/pelvic drainage catheter is negative for definitive fistulous connection. As such, this drainage catheter was removed at the patient's bedside without incident. PLAN: - patient's remaining left trans gluteal approach drainage catheter was converted from a JP bulb to a gravity bag. The patient was instructed to no longer flush the drainage catheter but to maintain diligent records regarding daily drainage catheter output. - The patient may follow-up at the interventional radiology drain clinic the week of December 13 with drainage catheter injection (no CT is necessary given resolution bilateral adnexal abscesses on today's CT and in hopes of limiting the patient's radiation exposure). - Again, recommend surgical and gastroenterology consultation given concern the above process is secondary to inflammatory bowel disease (such as Crohn's colitis) as opposed to a gynecologic infection. Electronically Signed   By: Sandi Mariscal M.D.   On: 11/18/2020 18:12     Labs:  CBC: Recent Labs    11/04/20 1432 11/10/20 1453 11/14/20 0811 11/18/20 1107  WBC 12.5* 12.3* 13.5* 11.6*  HGB 10.5* 10.0* 9.8* 9.4*  HCT 35.4* 34.2* 33.1* 32.1*  PLT 734* 699* 671* 675*    COAGS: Recent Labs    10/18/20 1445 11/10/20 1453  INR 1.1 1.2  APTT  --  38*    BMP: Recent Labs    10/13/20 0943 10/14/20 1501 11/14/20 0811  NA 136 134* 133*  K 3.3* 3.3* 3.7  CL 100 98 95*  CO2 25 26 28   GLUCOSE 102* 92 103*  BUN 16 10 5*  CALCIUM 9.1 8.6* 9.1  CREATININE 0.86 0.72 0.88  GFRNONAA >60 >60 >60    LIVER FUNCTION TESTS: Recent Labs    10/13/20 0943 11/14/20 0811  BILITOT 0.2* 0.5  AST 16 11*  ALT 11 8  ALKPHOS 65 42   PROT 8.8* 7.2  ALBUMIN 3.4* 2.6*    TUMOR MARKERS: No results for input(s): AFPTM, CEA, CA199, CHROMGRNA in the last 8760 hours.  Assessment and Plan: Intra-abdominal vs. Pelvic  abscess s/p percutaneous, TG drain placement 11/13/20 by Dr. Serafina Royals. Last exchanged 12/09/20 for poor drainage through the catheter Ms. Brand presents to IR drain clinic today for evaluation and management of her left TG drain.  She has some leakage around her drain insertion site with some drainage into gravity bag.  It is tender to touch.   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 Case reviewed by Dr. Laurence Ferrari.  CT Pelvis with contrast obtained and reviewed as well.  Appears she may have small abscesses along the length of the catheter tract.  Injection also performed and reviewed by Dr. Laurence Ferrari.  See dictation for full report.  Drain to remain in place today.  Continue current management and site care.  Patient to start Augmentin 875 mg BID x 10 days. Prescription sent to preferred pharmacy of [place].  Keep scheduled appointment in drain clinic.   Thank you for this interesting consult.  I greatly enjoyed meeting [facility] and look forward to participating in their care.  A copy of this report was sent to the requesting provider on this date.  Electronically Signed: Docia Barrier 12/16/2020, 3:37 PM   Please see Dr. Laurence Ferrari attestation of this note for management and plan.

## 2020-12-21 ENCOUNTER — Ambulatory Visit
Admission: RE | Admit: 2020-12-21 | Discharge: 2020-12-21 | Disposition: A | Discharge status: Home / Self Care | Payer: Medicaid Other | Source: Ambulatory Visit | Attending: Obstetrics & Gynecology | Admitting: Obstetrics & Gynecology

## 2020-12-21 ENCOUNTER — Other Ambulatory Visit: Payer: Self-pay | Admitting: General Surgery

## 2020-12-21 DIAGNOSIS — N7093 Salpingitis and oophoritis, unspecified: Secondary | ICD-10-CM

## 2020-12-21 HISTORY — PX: IR RADIOLOGIST EVAL & MGMT: IMG5224

## 2020-12-21 NOTE — Progress Notes (Signed)
Referring Physician(s): Dr. Jeanmarie Hubert Dr. 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  History of present illness:  20 y.o. female outpatient. History of  tuboovarian abscess found to have a large pelvis fluid collection of unknown etiology favoring inflammatory bowel. On 11.26.21 IR placed an abscess drain to the  RLQ (anterior drain) fluid collection. On 11.27.21  a second drain was placed via a transgluteal approach to to the left adnexa fluid collection. 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  The Drain was last exchanged on 12.23.21. Fistulogram performed on 12.30.21 due to leaking showed the existing drain in good position with a persistent fistula.  Ms  Delafuente was placed on a course of Augmentin in hopes of resolving micro abscesses along the track of the abscess catheter. Ms Imel unfortunately missed her appointment with the gastroenterologist at [facility] and is rescheduled for August 23, 2021.  She met with a colorectal surgeon at [facility] on 1.3.21 who is currently following her.    Ms. Steinkamp presents to the [facility] Radiology drain clinic for drain evaluation.  Ms Uber states a resolution in the leaking around the drain exit site. She is still taking Augmentin. Output has been 10 ml per day with no flushing per instruction. Currently without any significant complaints. Patient alert and laying in bed, calm and comfortable. Denies any fevers, headache, chest pain, SOB, cough, abdominal pain, nausea, vomiting or bleeding. She endorses "hot flashes"   Past Medical History:  Diagnosis Date  . Abdominal pain   . Ovarian cyst     Past Surgical History:  Procedure Laterality Date  . COLONOSCOPY  2020  . IR CATHETER TUBE CHANGE   12/09/2020  . IR RADIOLOGIST EVAL & MGMT  11/30/2020  . IR RADIOLOGIST EVAL & MGMT  12/08/2020  . IR RADIOLOGIST EVAL & MGMT  12/21/2020  . IR SINUS/FIST TUBE CHK-NON GI  11/15/2020  . IR SINUS/FIST TUBE CHK-NON GI  11/15/2020  . IR SINUS/FIST TUBE CHK-NON GI  11/18/2020  . IR SINUS/FIST TUBE CHK-NON GI  12/16/2020  . TIBIA FRACTURE SURGERY      Allergies: Patient has no known allergies.  Medications: Prior to Admission medications   Medication Sig Start Date End Date Taking? Authorizing Provider  amoxicillin-clavulanate (AUGMENTIN) 875-125 MG tablet Take 1 tablet by mouth 2 (two) times daily. Patient not taking: Reported on 12/08/2020 11/19/20   Woodroe Mode, MD  amoxicillin-clavulanate (AUGMENTIN) 875-125 MG tablet Take 1 tablet by mouth 2 (two) times daily. 12/16/20   Docia Barrier, PA  ibuprofen (ADVIL) 600 MG tablet Take 1 tablet (600 mg total) by mouth every 6 (six) hours as needed for cramping. 10/20/20   Constant, Peggy, MD  oxyCODONE-acetaminophen (PERCOCET/ROXICET) 5-325 MG tablet Take 1-2 tablets by mouth every 6 (six) hours as needed. Patient taking differently: Take 1-2 tablets by mouth every 6 (six) hours as needed for moderate pain. 12/02/20   Woodroe Mode, MD     No family history on file.  Social History   Socioeconomic History  . Marital status: Single    Spouse name: Not on file  . Number of children: Not on file  . Years of education: Not on file  . Highest education  level: Not on file  Occupational History  . Occupation: Scientist, water quality  Tobacco Use  . Smoking status: Current Every Day Smoker  . Smokeless tobacco: Never Used  Substance and Sexual Activity  . Alcohol use: Yes    Comment: occ  . Drug use: Not on file    Comment: last time 1 month ago  . Sexual activity: Not on file  Other Topics Concern  . Not on file  Social History Narrative  . Not on file   Social Determinants of Health   Financial Resource Strain: Not on file  Food  Insecurity: No Food Insecurity  . Worried About Charity fundraiser in the Last Year: Never true  . Ran Out of Food in the Last Year: Never true  Transportation Needs: No Transportation Needs  . Lack of Transportation (Medical): No  . Lack of Transportation (Non-Medical): No  Physical Activity: Not on file  Stress: Not on file  Social Connections: Not on file     Vital Signs: BP 101/63   Pulse (!) 118   SpO2 100%   Physical Exam Vitals and nursing note reviewed.  Constitutional:      Appearance: She is well-developed.  HENT:     Head: Normocephalic and atraumatic.  Eyes:     Conjunctiva/sclera: Conjunctivae normal.  Pulmonary:     Effort: Pulmonary effort is normal.  Abdominal:     Comments: Positive left adnexal abscess drain to gravity bag. 5 ml of  thick yellow colored fluid noted in gravity bag. Drain is not being flushed at this time.    Musculoskeletal:        General: Normal range of motion.     Cervical back: Normal range of motion.  Skin:    General: Skin is warm.  Neurological:     Mental Status: She is alert and oriented to person, place, and time.     Imaging: IR Radiologist Eval & Mgmt  Result Date: 12/21/2020 Please refer to notes tab for details about interventional procedure. (Op Note)   Labs:  CBC: Recent Labs    11/04/20 1432 11/10/20 1453 11/14/20 0811 11/18/20 1107  WBC 12.5* 12.3* 13.5* 11.6*  HGB 10.5* 10.0* 9.8* 9.4*  HCT 35.4* 34.2* 33.1* 32.1*  PLT 734* 699* 671* 675*    COAGS: Recent Labs    10/18/20 1445 11/10/20 1453  INR 1.1 1.2  APTT  --  38*    BMP: Recent Labs    10/13/20 0943 10/14/20 1501 11/14/20 0811  NA 136 134* 133*  K 3.3* 3.3* 3.7  CL 100 98 95*  CO2 25 26 28   GLUCOSE 102* 92 103*  BUN 16 10 5*  CALCIUM 9.1 8.6* 9.1  CREATININE 0.86 0.72 0.88  GFRNONAA >60 >60 >60    LIVER FUNCTION TESTS: Recent Labs    10/13/20 0943 11/14/20 0811  BILITOT 0.2* 0.5  AST 16 11*  ALT 11 8  ALKPHOS 65 42   PROT 8.8* 7.2  ALBUMIN 3.4* 2.6*    Assessment: 20 y.o. female outpatient. History of  tuboovarian abscess found to have a large pelvis fluid collection of unknown etiology favoring inflammatory bowel. On 11.26.21 IR placed an abscess drain to the  RLQ (anterior drain) fluid collection. On 11.27.21  a second drain was placed via a transgluteal approach to to the left adnexa fluid collection. The right anterior drain was removed  on 12.27.21 and Ms Giza was found to  have a fistula from the left adnexa fluid collection  to the adjacent bowel.  The Drain was last exchanged on 12.23.21. Fistulogram performed on 12.30.21 due to leaking showed the existing drain in good position with a persistent fistula. The course of  Augmentin that Ms Scoggin was placed on appears to have resolved the micro abscesses that track along the abscess catheter and she is reporting no leaking around the catheter exit site.  Ms Sidman unfortunately missed her appointment with the gastroenterologist at [facility] and is rescheduled for August 23, 2021.  She met with a colorectal surgeon at [facility] on 1.3.21 who is currently following her.   Case reviewed by Dr. Vernia Buff. As Ms Yeatts abscess appears to be improving no additional imaging was obtained at this visit. Continue current management and site care. Ms Castronovo was instructed to complete her current course of antibiotics and to follow up with the gastroenterologist at [facility] as scheduled.   Recommend Ms Lurie be scheduled for a fistulogram  in the [facility] in 2 weeks time. No CT needed..    Signed: Jacqualine Mau, NP 12/21/2020, 2:27 PM   Please refer to Dr. Annamaria Boots attestation of this note for management and plan.

## 2020-12-23 ENCOUNTER — Ambulatory Visit: Admit: 2020-12-23 | Discharge: 2020-12-24

## 2020-12-23 DIAGNOSIS — N7093 Salpingitis and oophoritis, unspecified: Principal | ICD-10-CM

## 2020-12-23 DIAGNOSIS — N5089 Other specified disorders of the male genital organs: Principal | ICD-10-CM

## 2020-12-23 DIAGNOSIS — K50918 Crohn's disease, unspecified, with other complication: Principal | ICD-10-CM

## 2020-12-24 ENCOUNTER — Other Ambulatory Visit (HOSPITAL_BASED_OUTPATIENT_CLINIC_OR_DEPARTMENT_OTHER): Payer: Self-pay | Admitting: Surgery

## 2020-12-24 DIAGNOSIS — K50919 Crohn's disease, unspecified, with unspecified complications: Principal | ICD-10-CM

## 2020-12-24 DIAGNOSIS — K50918 Crohn's disease, unspecified, with other complication: Principal | ICD-10-CM

## 2020-12-24 DIAGNOSIS — N5089 Other specified disorders of the male genital organs: Principal | ICD-10-CM

## 2020-12-24 MED ORDER — NEOMYCIN 500 MG TABLET
ORAL_TABLET | Freq: Three times a day (TID) | ORAL | 0 refills | 1.00000 days | Status: CP
Start: 2020-12-24 — End: 2021-01-14

## 2020-12-24 MED ORDER — ERYTHROMYCIN 250 MG TABLET
ORAL_TABLET | Freq: Three times a day (TID) | ORAL | 0 refills | 1.00000 days | Status: CP
Start: 2020-12-24 — End: 2020-12-25

## 2020-12-24 MED ORDER — POLYETHYLENE GLYCOL 3350 17 GRAM ORAL POWDER PACKET
PACK | Freq: Once | ORAL | 0 refills | 1.00000 days | Status: CP
Start: 2020-12-24 — End: 2021-01-14

## 2020-12-24 MED ORDER — BISACODYL 5 MG TABLET,DELAYED RELEASE
ORAL_TABLET | Freq: Once | ORAL | 0 refills | 1 days | Status: CP
Start: 2020-12-24 — End: 2021-01-14

## 2020-12-24 MED FILL — SM GENTLE LAXATIVE EC 5 MG: 5 MG | 25 days supply | Qty: 100 | Fill #0

## 2020-12-24 MED FILL — POLYETHYLENE GLYCOL 3350 PO: 17 | 2 days supply | Qty: 510 | Fill #0

## 2020-12-24 MED FILL — ERYTHROMYCIN BASE 250 MG TA: 250 | 1 days supply | Qty: 12 | Fill #0

## 2020-12-24 MED FILL — NEOMYCIN 500 MG TABLET: 500 | 1 days supply | Qty: 6 | Fill #0

## 2020-12-28 ENCOUNTER — Ambulatory Visit: Admit: 2020-12-28 | Discharge: 2021-01-14 | Disposition: A | Discharge status: Home / Self Care | Payer: MEDICAID

## 2020-12-28 ENCOUNTER — Encounter
Admit: 2020-12-28 | Discharge: 2021-01-14 | Disposition: A | Discharge status: Home / Self Care | Payer: MEDICAID | Attending: Nurse Practitioner

## 2020-12-28 ENCOUNTER — Encounter: Admit: 2020-12-28 | Discharge: 2021-01-14 | Disposition: A | Discharge status: Home / Self Care | Payer: MEDICAID

## 2020-12-28 DIAGNOSIS — N739 Female pelvic inflammatory disease, unspecified: Principal | ICD-10-CM

## 2020-12-28 DIAGNOSIS — Z20822 Contact with and (suspected) exposure to covid-19: Principal | ICD-10-CM

## 2020-12-28 DIAGNOSIS — E871 Hypo-osmolality and hyponatremia: Principal | ICD-10-CM

## 2020-12-28 DIAGNOSIS — K50012 Crohn's disease of small intestine with intestinal obstruction: Principal | ICD-10-CM

## 2020-12-28 DIAGNOSIS — K50013 Crohn's disease of small intestine with fistula: Principal | ICD-10-CM

## 2020-12-28 DIAGNOSIS — E43 Unspecified severe protein-calorie malnutrition: Principal | ICD-10-CM

## 2020-12-28 DIAGNOSIS — Z681 Body mass index (BMI) 19 or less, adult: Principal | ICD-10-CM

## 2020-12-28 DIAGNOSIS — K651 Peritoneal abscess: Principal | ICD-10-CM

## 2020-12-28 DIAGNOSIS — D509 Iron deficiency anemia, unspecified: Principal | ICD-10-CM

## 2020-12-28 DIAGNOSIS — K50914 Crohn's disease, unspecified, with abscess: Principal | ICD-10-CM

## 2020-12-28 DIAGNOSIS — R634 Abnormal weight loss: Principal | ICD-10-CM

## 2020-12-28 DIAGNOSIS — N83292 Other ovarian cyst, left side: Principal | ICD-10-CM

## 2020-12-28 DIAGNOSIS — K50919 Crohn's disease, unspecified, with unspecified complications: Principal | ICD-10-CM

## 2020-12-28 DIAGNOSIS — T8143XA Infection following a procedure, organ and space surgical site, initial encounter: Principal | ICD-10-CM

## 2020-12-30 DIAGNOSIS — R634 Abnormal weight loss: Principal | ICD-10-CM

## 2020-12-30 DIAGNOSIS — N83292 Other ovarian cyst, left side: Principal | ICD-10-CM

## 2020-12-30 DIAGNOSIS — K50914 Crohn's disease, unspecified, with abscess: Principal | ICD-10-CM

## 2020-12-30 DIAGNOSIS — K50013 Crohn's disease of small intestine with fistula: Principal | ICD-10-CM

## 2020-12-30 DIAGNOSIS — K50012 Crohn's disease of small intestine with intestinal obstruction: Principal | ICD-10-CM

## 2020-12-30 DIAGNOSIS — D509 Iron deficiency anemia, unspecified: Principal | ICD-10-CM

## 2020-12-30 DIAGNOSIS — E43 Unspecified severe protein-calorie malnutrition: Principal | ICD-10-CM

## 2020-12-30 DIAGNOSIS — N739 Female pelvic inflammatory disease, unspecified: Principal | ICD-10-CM

## 2020-12-30 DIAGNOSIS — E871 Hypo-osmolality and hyponatremia: Principal | ICD-10-CM

## 2020-12-30 DIAGNOSIS — Z681 Body mass index (BMI) 19 or less, adult: Principal | ICD-10-CM

## 2020-12-30 DIAGNOSIS — K651 Peritoneal abscess: Principal | ICD-10-CM

## 2020-12-30 DIAGNOSIS — Z20822 Contact with and (suspected) exposure to covid-19: Principal | ICD-10-CM

## 2020-12-31 DIAGNOSIS — E43 Unspecified severe protein-calorie malnutrition: Principal | ICD-10-CM

## 2020-12-31 DIAGNOSIS — N739 Female pelvic inflammatory disease, unspecified: Principal | ICD-10-CM

## 2020-12-31 DIAGNOSIS — Z20822 Contact with and (suspected) exposure to covid-19: Principal | ICD-10-CM

## 2020-12-31 DIAGNOSIS — K50012 Crohn's disease of small intestine with intestinal obstruction: Principal | ICD-10-CM

## 2020-12-31 DIAGNOSIS — K50013 Crohn's disease of small intestine with fistula: Principal | ICD-10-CM

## 2020-12-31 DIAGNOSIS — Z681 Body mass index (BMI) 19 or less, adult: Principal | ICD-10-CM

## 2020-12-31 DIAGNOSIS — K651 Peritoneal abscess: Principal | ICD-10-CM

## 2020-12-31 DIAGNOSIS — R634 Abnormal weight loss: Principal | ICD-10-CM

## 2020-12-31 DIAGNOSIS — E871 Hypo-osmolality and hyponatremia: Principal | ICD-10-CM

## 2020-12-31 DIAGNOSIS — N83292 Other ovarian cyst, left side: Principal | ICD-10-CM

## 2020-12-31 DIAGNOSIS — D509 Iron deficiency anemia, unspecified: Principal | ICD-10-CM

## 2020-12-31 DIAGNOSIS — K50914 Crohn's disease, unspecified, with abscess: Principal | ICD-10-CM

## 2021-01-04 ENCOUNTER — Other Ambulatory Visit: Payer: Medicaid Other

## 2021-01-04 DIAGNOSIS — R634 Abnormal weight loss: Principal | ICD-10-CM

## 2021-01-04 DIAGNOSIS — K50914 Crohn's disease, unspecified, with abscess: Principal | ICD-10-CM

## 2021-01-04 DIAGNOSIS — K651 Peritoneal abscess: Principal | ICD-10-CM

## 2021-01-04 DIAGNOSIS — N739 Female pelvic inflammatory disease, unspecified: Principal | ICD-10-CM

## 2021-01-04 DIAGNOSIS — Z681 Body mass index (BMI) 19 or less, adult: Principal | ICD-10-CM

## 2021-01-04 DIAGNOSIS — Z20822 Contact with and (suspected) exposure to covid-19: Principal | ICD-10-CM

## 2021-01-04 DIAGNOSIS — E43 Unspecified severe protein-calorie malnutrition: Principal | ICD-10-CM

## 2021-01-04 DIAGNOSIS — E871 Hypo-osmolality and hyponatremia: Principal | ICD-10-CM

## 2021-01-04 DIAGNOSIS — K50013 Crohn's disease of small intestine with fistula: Principal | ICD-10-CM

## 2021-01-04 DIAGNOSIS — N83292 Other ovarian cyst, left side: Principal | ICD-10-CM

## 2021-01-04 DIAGNOSIS — D509 Iron deficiency anemia, unspecified: Principal | ICD-10-CM

## 2021-01-04 DIAGNOSIS — K50012 Crohn's disease of small intestine with intestinal obstruction: Principal | ICD-10-CM

## 2021-01-05 DIAGNOSIS — N83292 Other ovarian cyst, left side: Principal | ICD-10-CM

## 2021-01-05 DIAGNOSIS — E871 Hypo-osmolality and hyponatremia: Principal | ICD-10-CM

## 2021-01-05 DIAGNOSIS — E43 Unspecified severe protein-calorie malnutrition: Principal | ICD-10-CM

## 2021-01-05 DIAGNOSIS — Z681 Body mass index (BMI) 19 or less, adult: Principal | ICD-10-CM

## 2021-01-05 DIAGNOSIS — Z20822 Contact with and (suspected) exposure to covid-19: Principal | ICD-10-CM

## 2021-01-05 DIAGNOSIS — K50013 Crohn's disease of small intestine with fistula: Principal | ICD-10-CM

## 2021-01-05 DIAGNOSIS — K50914 Crohn's disease, unspecified, with abscess: Principal | ICD-10-CM

## 2021-01-05 DIAGNOSIS — K50012 Crohn's disease of small intestine with intestinal obstruction: Principal | ICD-10-CM

## 2021-01-05 DIAGNOSIS — D509 Iron deficiency anemia, unspecified: Principal | ICD-10-CM

## 2021-01-05 DIAGNOSIS — R634 Abnormal weight loss: Principal | ICD-10-CM

## 2021-01-05 DIAGNOSIS — K651 Peritoneal abscess: Principal | ICD-10-CM

## 2021-01-05 DIAGNOSIS — N739 Female pelvic inflammatory disease, unspecified: Principal | ICD-10-CM

## 2021-01-11 MED ORDER — OXYCODONE 5 MG TABLET
ORAL_TABLET | ORAL | 0 refills | 0.00000 days | Status: CP
Start: 2021-01-11 — End: ?
  Filled 2021-01-14: qty 25, 2d supply, fill #0

## 2021-01-11 MED ORDER — AMOXICILLIN 875 MG-POTASSIUM CLAVULANATE 125 MG TABLET
ORAL_TABLET | Freq: Two times a day (BID) | ORAL | 0 refills | 2.00000 days | Status: CP
Start: 2021-01-11 — End: 2021-01-13

## 2021-01-11 MED ORDER — ENOXAPARIN 30 MG/0.3 ML SUBCUTANEOUS SYRINGE
SUBCUTANEOUS | 0 refills | 30.00000 days | Status: CP
Start: 2021-01-11 — End: 2021-02-13
  Filled 2021-01-14: qty 9, 30d supply, fill #0

## 2021-01-11 MED ORDER — ACETAMINOPHEN 500 MG TABLET
ORAL_TABLET | 0 refills | 0.00000 days
Start: 2021-01-11 — End: ?

## 2021-01-12 ENCOUNTER — Ambulatory Visit: Payer: Self-pay | Admitting: Obstetrics and Gynecology

## 2021-01-12 DIAGNOSIS — Z681 Body mass index (BMI) 19 or less, adult: Principal | ICD-10-CM

## 2021-01-12 DIAGNOSIS — K651 Peritoneal abscess: Principal | ICD-10-CM

## 2021-01-12 DIAGNOSIS — D509 Iron deficiency anemia, unspecified: Principal | ICD-10-CM

## 2021-01-12 DIAGNOSIS — K50013 Crohn's disease of small intestine with fistula: Principal | ICD-10-CM

## 2021-01-12 DIAGNOSIS — K50914 Crohn's disease, unspecified, with abscess: Principal | ICD-10-CM

## 2021-01-12 DIAGNOSIS — N83292 Other ovarian cyst, left side: Principal | ICD-10-CM

## 2021-01-12 DIAGNOSIS — R634 Abnormal weight loss: Principal | ICD-10-CM

## 2021-01-12 DIAGNOSIS — K50012 Crohn's disease of small intestine with intestinal obstruction: Principal | ICD-10-CM

## 2021-01-12 DIAGNOSIS — E871 Hypo-osmolality and hyponatremia: Principal | ICD-10-CM

## 2021-01-12 DIAGNOSIS — Z20822 Contact with and (suspected) exposure to covid-19: Principal | ICD-10-CM

## 2021-01-12 DIAGNOSIS — E43 Unspecified severe protein-calorie malnutrition: Principal | ICD-10-CM

## 2021-01-12 DIAGNOSIS — N739 Female pelvic inflammatory disease, unspecified: Principal | ICD-10-CM

## 2021-01-13 MED ORDER — AMOXICILLIN 875 MG-POTASSIUM CLAVULANATE 125 MG TABLET
ORAL_TABLET | Freq: Two times a day (BID) | ORAL | 0 refills | 7.00000 days | Status: CP
Start: 2021-01-13 — End: 2021-01-21
  Filled 2021-01-14: qty 14, 7d supply, fill #0

## 2021-01-14 MED ORDER — COLOPLAST BARRIER RING 2"
PRN refills | 0 days
Start: 2021-01-14 — End: ?

## 2021-01-14 MED ORDER — NO STING BARRIER FILM PADS
PRN refills | 0 days
Start: 2021-01-14 — End: ?

## 2021-01-14 MED ORDER — NEW IMAGE FLEXTEND BARRIER FLAT 2 1/4"
PRN refills | 0 days
Start: 2021-01-14 — End: ?

## 2021-01-14 MED ORDER — OSTOMY SUPPLIES POWDER
PRN refills | 0 days
Start: 2021-01-14 — End: ?

## 2021-01-14 MED ORDER — NEW IMAGE DRAINABLE POUCH 2 1/4 " (12")
PRN refills | 0 days
Start: 2021-01-14 — End: ?

## 2021-01-14 MED FILL — ADAPT STOMA POWDER TOPICAL: 30 days supply | Qty: 28.3 | Fill #0

## 2021-01-14 MED FILL — NO STING BARRIER FILM PADS: 30 days supply | Qty: 30 | Fill #0

## 2021-01-14 MED FILL — NEW IMAGE FLEXTEND BARRIER FLAT 2 1/4": 30 days supply | Qty: 20 | Fill #0

## 2021-01-14 MED FILL — NEW IMAGE DRAINABLE POUCH 2 1/4 " (12"): 30 days supply | Qty: 20 | Fill #0

## 2021-01-14 MED FILL — COLOPLAST BARRIER RING 2": 30 days supply | Qty: 20 | Fill #0

## 2021-01-24 ENCOUNTER — Ambulatory Visit: Admit: 2021-01-24 | Discharge: 2021-01-25

## 2021-01-24 DIAGNOSIS — Z932 Ileostomy status: Principal | ICD-10-CM

## 2021-01-24 DIAGNOSIS — Z9049 Acquired absence of other specified parts of digestive tract: Principal | ICD-10-CM

## 2021-01-24 DIAGNOSIS — K50919 Crohn's disease, unspecified, with unspecified complications: Principal | ICD-10-CM

## 2021-01-24 DIAGNOSIS — R933 Abnormal findings on diagnostic imaging of other parts of digestive tract: Principal | ICD-10-CM

## 2021-01-27 ENCOUNTER — Ambulatory Visit: Admit: 2021-01-27 | Discharge: 2021-01-28 | Attending: Gastroenterology | Primary: Gastroenterology

## 2021-01-27 DIAGNOSIS — K50919 Crohn's disease, unspecified, with unspecified complications: Principal | ICD-10-CM

## 2021-02-07 ENCOUNTER — Ambulatory Visit: Admit: 2021-02-07 | Discharge: 2021-02-08

## 2021-02-07 DIAGNOSIS — K50019 Crohn's disease of small intestine with unspecified complications: Principal | ICD-10-CM

## 2021-02-09 MED ORDER — OSTOMY SUPPLIES
11 refills | 0.00000 days | Status: CP
Start: 2021-02-09 — End: ?

## 2021-03-21 ENCOUNTER — Ambulatory Visit: Admit: 2021-03-21 | Discharge: 2021-03-22

## 2021-03-21 DIAGNOSIS — K50019 Crohn's disease of small intestine with unspecified complications: Principal | ICD-10-CM

## 2021-05-03 ENCOUNTER — Encounter: Admit: 2021-05-03 | Discharge: 2021-05-06 | Disposition: A | Discharge status: Home / Self Care | Payer: MEDICAID

## 2021-05-03 ENCOUNTER — Ambulatory Visit: Admit: 2021-05-03 | Discharge: 2021-05-06 | Disposition: A | Discharge status: Home / Self Care | Payer: MEDICAID

## 2021-05-06 MED ORDER — OXYCODONE 5 MG TABLET
ORAL_TABLET | ORAL | 0 refills | 4.00000 days | Status: CP | PRN
Start: 2021-05-06 — End: ?

## 2021-05-06 MED ORDER — ACETAMINOPHEN 500 MG TABLET
ORAL_TABLET | Freq: Four times a day (QID) | ORAL | 0 refills | 10.00000 days | Status: CP | PRN
Start: 2021-05-06 — End: ?
  Filled 2021-05-06: qty 20, 4d supply, fill #0
  Filled 2021-05-06: qty 40, 10d supply, fill #0

## 2021-05-07 MED ORDER — ENOXAPARIN 40 MG/0.4 ML SUBCUTANEOUS SYRINGE
SUBCUTANEOUS | 0 refills | 27.00000 days | Status: CP
Start: 2021-05-07 — End: 2021-06-03
  Filled 2021-05-06: qty 10.8, 27d supply, fill #0

## 2021-05-23 ENCOUNTER — Ambulatory Visit: Admit: 2021-05-23 | Discharge: 2021-05-24

## 2021-05-23 DIAGNOSIS — K50919 Crohn's disease, unspecified, with unspecified complications: Principal | ICD-10-CM

## 2021-06-02 ENCOUNTER — Ambulatory Visit: Admit: 2021-06-02 | Discharge: 2021-06-02 | Payer: MEDICAID | Attending: Gastroenterology | Primary: Gastroenterology

## 2021-06-02 ENCOUNTER — Ambulatory Visit: Admit: 2021-06-02 | Discharge: 2021-06-02 | Payer: MEDICAID

## 2021-06-02 DIAGNOSIS — K50919 Crohn's disease, unspecified, with unspecified complications: Principal | ICD-10-CM

## 2021-06-02 DIAGNOSIS — E611 Iron deficiency: Principal | ICD-10-CM

## 2021-06-22 DIAGNOSIS — K50919 Crohn's disease, unspecified, with unspecified complications: Principal | ICD-10-CM

## 2021-06-22 MED ORDER — HUMIRA PEN CITRATE FREE STARTER PACK FOR CROHN'S/UC/HS 3 X 80 MG/0.8 ML
PACK | ORAL | 0 refills | 0.00000 days | Status: CP
Start: 2021-06-22 — End: ?

## 2021-06-22 MED ORDER — HUMIRA PEN CITRATE FREE 40 MG/0.4 ML
SUBCUTANEOUS | 1 refills | 84.00000 days | Status: CP
Start: 2021-06-22 — End: ?

## 2021-06-24 DIAGNOSIS — K50019 Crohn's disease of small intestine with unspecified complications: Principal | ICD-10-CM

## 2021-08-06 ENCOUNTER — Encounter (HOSPITAL_BASED_OUTPATIENT_CLINIC_OR_DEPARTMENT_OTHER): Payer: Self-pay | Admitting: Emergency Medicine

## 2021-08-06 ENCOUNTER — Other Ambulatory Visit: Payer: Self-pay

## 2021-08-06 ENCOUNTER — Emergency Department (HOSPITAL_BASED_OUTPATIENT_CLINIC_OR_DEPARTMENT_OTHER)
Admission: EM | Admit: 2021-08-06 | Discharge: 2021-08-06 | Disposition: A | Discharge status: Home / Self Care | Payer: Medicaid Other | Attending: Emergency Medicine | Admitting: Emergency Medicine

## 2021-08-06 DIAGNOSIS — J020 Streptococcal pharyngitis: Secondary | ICD-10-CM | POA: Insufficient documentation

## 2021-08-06 DIAGNOSIS — F17200 Nicotine dependence, unspecified, uncomplicated: Secondary | ICD-10-CM | POA: Insufficient documentation

## 2021-08-06 HISTORY — DX: Crohn's disease, unspecified, without complications: K50.90

## 2021-08-06 NOTE — ED Notes (Signed)
Patient Alert and oriented to baseline. Stable and ambulatory to baseline. Patient verbalized understanding of the discharge instructions.  Patient belongings were taken by the patient.   

## 2021-08-06 NOTE — ED Triage Notes (Signed)
Pt presents to ED POV. Pt c/o9 sore throat x3d. Pt reports that she got covid vax 3d ago and has had sore throat since. Denies fever chills, swelling, hives, itching. Airway intact, resp e/u.

## 2021-08-06 NOTE — Discharge Instructions (Addendum)
Take tylenol 2 pills 4 times a day and motrin 4 pills 3 times a day.  Drink plenty of fluids.  Return for worsening shortness of breath, headache, confusion. Follow up with your family doctor.   

## 2021-08-06 NOTE — ED Provider Notes (Signed)
[facility] EMERGENCY DEPARTMENT Provider Note   CSN: [id number] Arrival date & time: 08/06/21  1301     History Chief Complaint  Patient presents with   Sore Throat    Rebecca Bell is a 20 y.o. female.  20 yo F with a chief complaints of a sore throat.  Going on for about 3 days now.  Just had her COVID vaccination at the onset of her symptoms.  Denies sick contacts denies cough denies fever.  Has pain with swallowing.  The history is provided by the patient.  Sore Throat This is a new problem. The current episode started more than 2 days ago. The problem occurs constantly. The problem has not changed since onset.Pertinent negatives include no chest pain, no headaches and no shortness of breath. The symptoms are aggravated by swallowing. Nothing relieves the symptoms. She has tried nothing for the symptoms. The treatment provided no relief.      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 ([place]) 11/11/2020   TOA (tubo-ovarian abscess) 11/10/2020   Thalassemia, unspecified 11/04/2020   Iron deficiency anemia 11/04/2020   Thrombocytosis 10/17/2020   Anemia 10/17/2020    Past Surgical History:  Procedure Laterality Date   COLONOSCOPY  2020   IR CATHETER TUBE CHANGE  12/09/2020   IR RADIOLOGIST EVAL & MGMT  11/30/2020   IR RADIOLOGIST EVAL & MGMT  12/08/2020   IR RADIOLOGIST EVAL & MGMT  12/21/2020   IR SINUS/FIST TUBE CHK-NON GI  11/15/2020   IR SINUS/FIST TUBE CHK-NON GI  11/15/2020   IR SINUS/FIST TUBE CHK-NON GI  11/18/2020   IR SINUS/FIST TUBE CHK-NON GI  12/16/2020   TIBIA FRACTURE SURGERY       OB History     Gravida  0   Para  0   Term  0   Preterm  0   AB  0   Living  0      SAB  0   IAB  0   Ectopic  0   Multiple  0   Live Births  0            History reviewed. No pertinent family history.  Social History   Tobacco Use   Smoking status: Every Day   Smokeless tobacco: Never  Substance Use Topics   Alcohol use: Yes    Comment: occ    Home Medications Prior to Admission medications   Medication Sig Start Date End Date Taking? Authorizing Provider  amoxicillin-clavulanate (AUGMENTIN) 875-125 MG tablet TAKE 1 TABLET BY MOUTH TWO TIMES DAILY. Patient not taking: Reported on 12/08/2020 11/19/20 11/19/21  Woodroe Mode, MD  amoxicillin-clavulanate (AUGMENTIN) 875-125 MG tablet TAKE 1 TABLET BY MOUTH TWICE DAILY 12/16/20 12/16/21  Docia Barrier, PA  erythromycin (E-MYCIN) 250 MG tablet TAKE 4 TABLETS BY MOUTH 3 TIMES DAILY. TAKE AT 2:00PM, 3:00PM, AND 10:00PM THE DAY PRIOR TO SURGERY 12/24/20 12/24/21  Stem, Hollace Kinnier, MD  ibuprofen (ADVIL) 600 MG tablet TAKE 1 TABLET (600 MG TOTAL) BY MOUTH EVERY 6 (SIX) HOURS AS NEEDED FOR CRAMPING. 10/20/20 10/20/21  Constant, Peggy, MD  neomycin (MYCIFRADIN) 500 MG tablet TAKE 2 TABLETS BY MOUTH 3 TIMES DAILY. TAKE AT 2:00PM, 3:00PM, AND 10:00PM THE DAY PRIOR TO SURGERY  12/24/20 12/24/21  Stem, Hollace Kinnier, MD  polyethylene glycol powder (GLYCOLAX/MIRALAX) 17 GM/SCOOP powder TAKE 238 GRAMS BY MOUTH ONCE FOR 1 DOSE. MIX IN [place]. DRINK 8OZ OF MIXTURE EVERY 15 MINUTES UNTILE GONE STARTING AT 11AM ON DAY PRIOR TO SURGERY 12/24/20 12/24/21  Stem, Hollace Kinnier, MD  ferrous sulfate (FERROUSUL) 325 (65 FE) MG tablet Take 1 tablet (325 mg total) by mouth 2 (two) times daily. 10/20/20 11/19/20  Constant, Peggy, MD    Allergies    Patient has no known allergies.  Review of Systems   Review of Systems  Constitutional:  Negative for chills and fever.  HENT:  Positive for sore throat. Negative for congestion and rhinorrhea.   Eyes:  Negative for redness and visual disturbance.  Respiratory:  Negative for shortness of breath and wheezing.   Cardiovascular:  Negative for chest  pain and palpitations.  Gastrointestinal:  Negative for nausea and vomiting.  Genitourinary:  Negative for dysuria and urgency.  Musculoskeletal:  Negative for arthralgias and myalgias.  Skin:  Negative for pallor and wound.  Neurological:  Negative for dizziness and headaches.   Physical Exam Updated Vital Signs BP 108/75 (BP Location: Left Arm)   Pulse (!) 109   Temp 99 F (37.2 C) (Oral)   Resp 18   Ht 5' 5"  (1.651 m)   Wt 65.8 kg   LMP 07/08/2021   SpO2 100%   BMI 24.13 kg/m   Physical Exam Vitals and nursing note reviewed.  Constitutional:      General: She is not in acute distress.    Appearance: She is well-developed. She is not diaphoretic.  HENT:     Head: Normocephalic and atraumatic.     Mouth/Throat:     Comments: Trace erythema to the posterior oropharynx.  No tonsillar swelling no exudates no anterior cervical lymphadenopathy.  Uvula is midline.  Tolerating secretions without difficulty. Eyes:     Pupils: Pupils are equal, round, and reactive to light.  Cardiovascular:     Rate and Rhythm: Normal rate and regular rhythm.     Heart sounds: No murmur heard.   No friction rub. No gallop.  Pulmonary:     Effort: Pulmonary effort is normal.     Breath sounds: No wheezing or rales.  Abdominal:     General: There is no distension.     Palpations: Abdomen is soft.     Tenderness: There is no abdominal tenderness.  Musculoskeletal:        General: No tenderness.     Cervical back: Normal range of motion and neck supple.  Skin:    General: Skin is warm and dry.  Neurological:     Mental Status: She is alert and oriented to person, place, and time.  Psychiatric:        Behavior: Behavior normal.    ED Results / Procedures / Treatments   Labs (all labs ordered are listed, but only abnormal results are displayed) Labs Reviewed - No data to display  EKG None  Radiology No results found.  Procedures Procedures   Medications Ordered in ED Medications  - No data to display  ED Course  I have reviewed the triage vital signs and the nursing notes.  Pertinent labs & imaging results that were available during my care of the patient were reviewed by me and considered in my medical decision making (see chart for details).    MDM Rules/Calculators/A&P  20 yo F with a chief complaint of a sore throat.  Finding on exam.  Unlikely to be strep.  Most likely to be reaction to vaccine based on timeline.  We will have the patient follow-up with her family doctor.  Return for fever or difficulty swallowing.  1:41 PM:  I have discussed the diagnosis/risks/treatment options with the patient and believe the pt to be eligible for discharge home to follow-up with PCP. We also discussed returning to the ED immediately if new or worsening sx occur. We discussed the sx which are most concerning (e.g., sudden worsening pain, fever, inability to tolerate by mouth) that necessitate immediate return. Medications administered to the patient during their visit and any new prescriptions provided to the patient are listed below.  Medications given during this visit Medications - No data to display   The patient appears reasonably screen and/or stabilized for discharge and I doubt any other medical condition or other [facility] requiring further screening, evaluation, or treatment in the ED at this time prior to discharge.   Final Clinical Impression(s) / ED Diagnoses Final diagnoses:  Strep throat    Rx / DC Orders ED Discharge Orders     None        Deno Etienne, DO 08/06/21 1341

## 2021-09-01 ENCOUNTER — Ambulatory Visit: Admit: 2021-09-01 | Discharge: 2021-09-02 | Attending: Gastroenterology | Primary: Gastroenterology

## 2021-09-01 ENCOUNTER — Other Ambulatory Visit (HOSPITAL_BASED_OUTPATIENT_CLINIC_OR_DEPARTMENT_OTHER): Payer: Self-pay

## 2021-09-01 DIAGNOSIS — K50919 Crohn's disease, unspecified, with unspecified complications: Principal | ICD-10-CM

## 2021-09-01 DIAGNOSIS — Z79899 Other long term (current) drug therapy: Principal | ICD-10-CM

## 2021-09-01 DIAGNOSIS — D84821 Immunosuppression due to drug therapy (CMS-HCC): Principal | ICD-10-CM

## 2021-09-01 MED ORDER — COLESTIPOL 1 GRAM TABLET
ORAL_TABLET | 11 refills | 0 days | Status: CP
Start: 2021-09-01 — End: ?

## 2021-09-01 MED ORDER — COLESTIPOL HCL 1 G PO TABS
ORAL_TABLET | ORAL | 11 refills | Status: AC
  Filled 2021-09-01: qty 180, 45d supply, fill #0

## 2021-09-02 ENCOUNTER — Other Ambulatory Visit (HOSPITAL_BASED_OUTPATIENT_CLINIC_OR_DEPARTMENT_OTHER): Payer: Self-pay

## 2021-09-12 ENCOUNTER — Other Ambulatory Visit (HOSPITAL_BASED_OUTPATIENT_CLINIC_OR_DEPARTMENT_OTHER): Payer: Self-pay

## 2021-09-26 ENCOUNTER — Other Ambulatory Visit (HOSPITAL_BASED_OUTPATIENT_CLINIC_OR_DEPARTMENT_OTHER): Payer: Self-pay

## 2021-12-23 IMAGING — US US ART/VEN ABD/PELV/SCROTUM DOPPLER LTD
1 series · 13 of 25 positions shown · non-contrast
Comparison: None.

CLINICAL DATA: Severe pelvic pain for 1 week. Last menstrual period
09/23/2020.

EXAM:
TRANSABDOMINAL ULTRASOUND OF PELVIS
DOPPLER ULTRASOUND OF OVARIES
TECHNIQUE: Transabdominal ultrasound examination of the pelvis was performed
including evaluation of the uterus, ovaries, adnexal regions, and
pelvic cul-de-sac.
Color and duplex Doppler ultrasound was utilized to evaluate blood
flow to the ovaries.

[Series 1: us art/ven abd/pelv/scrotum doppler ltd · 150 acquisitions, 13 frames shown]
[im 1/150]
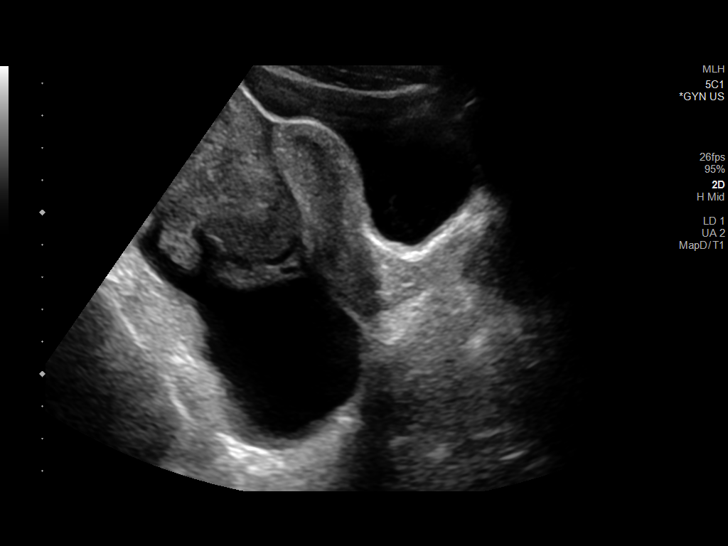
[im 13/150]
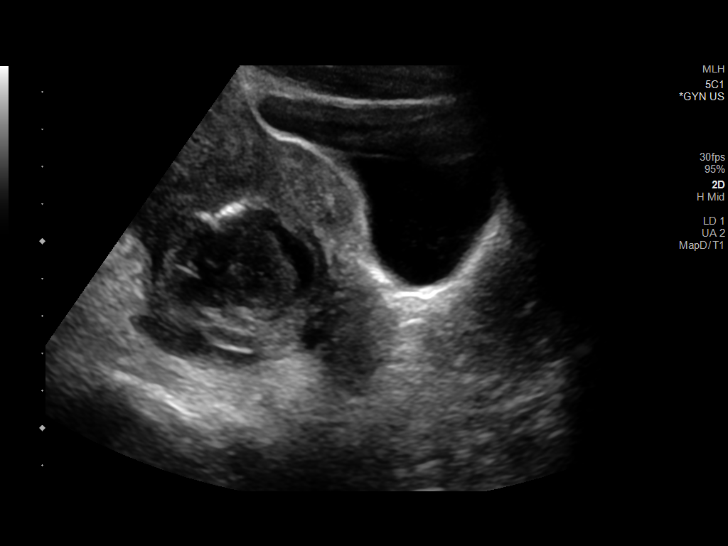
[im 25/150]
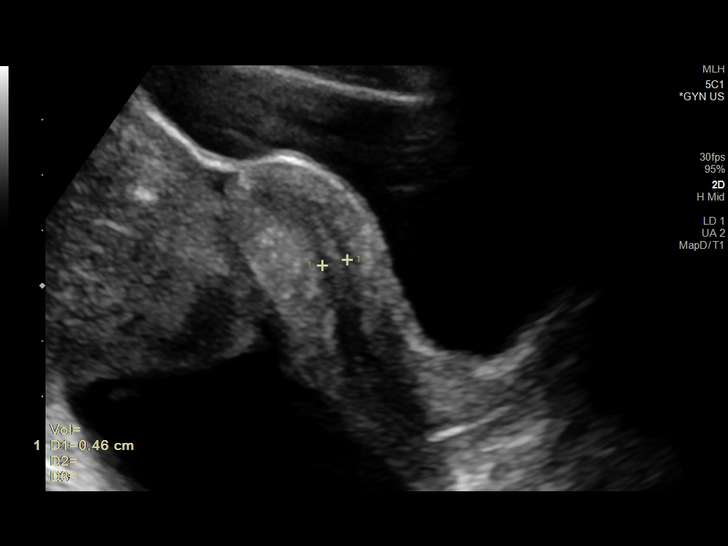
[im 38/150]
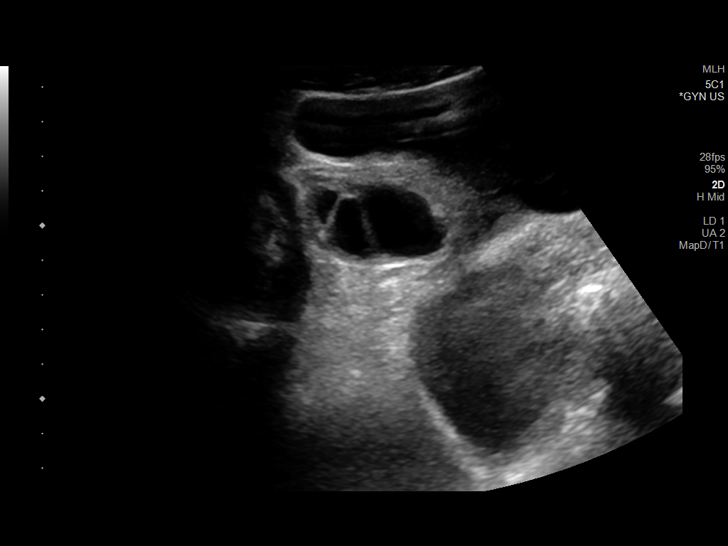
[im 50/150]
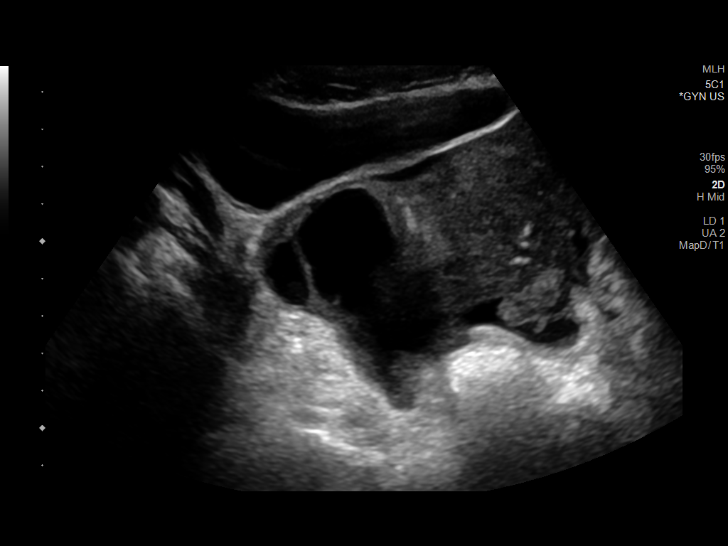
[im 63/150]
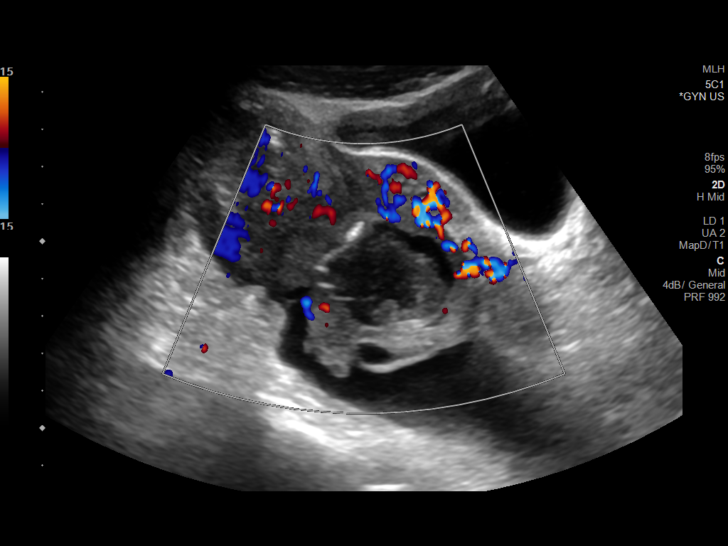
[im 75/150]
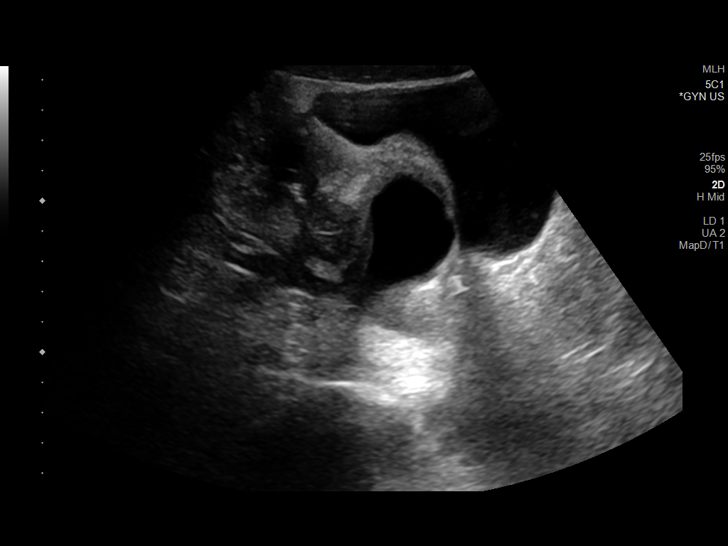
[im 87/150]
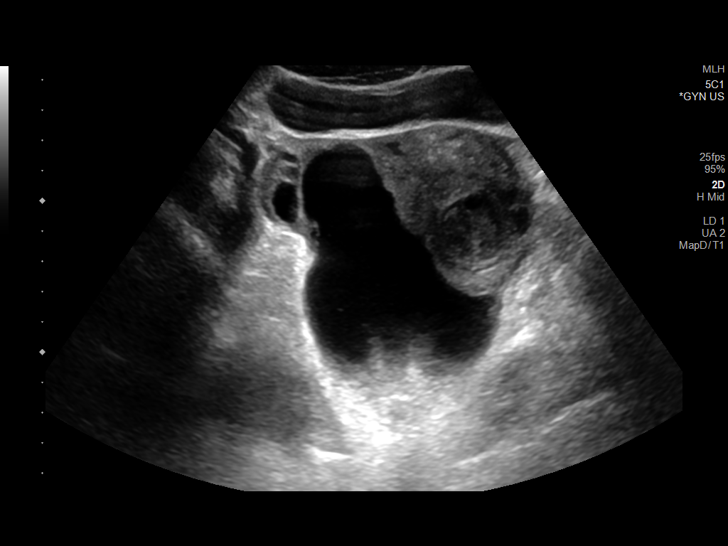
[im 100/150]
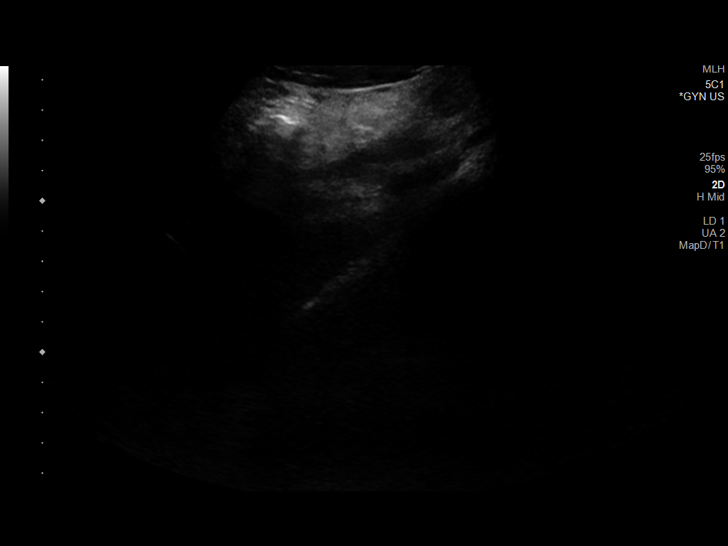
[im 112/150]
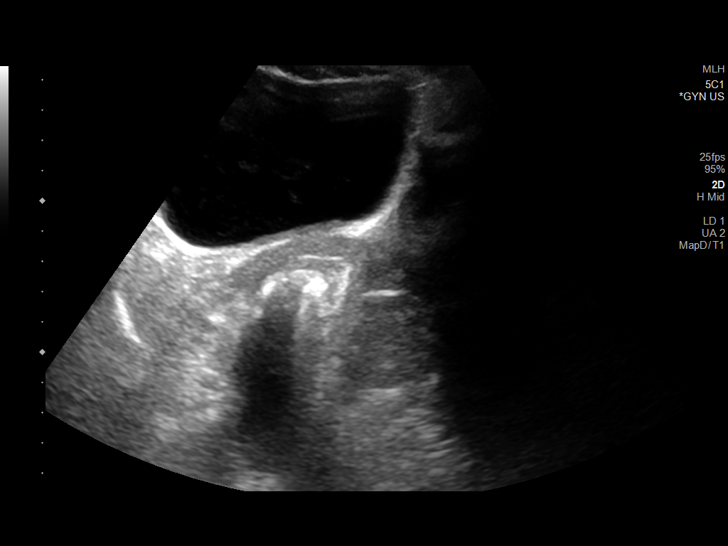
[im 125/150]
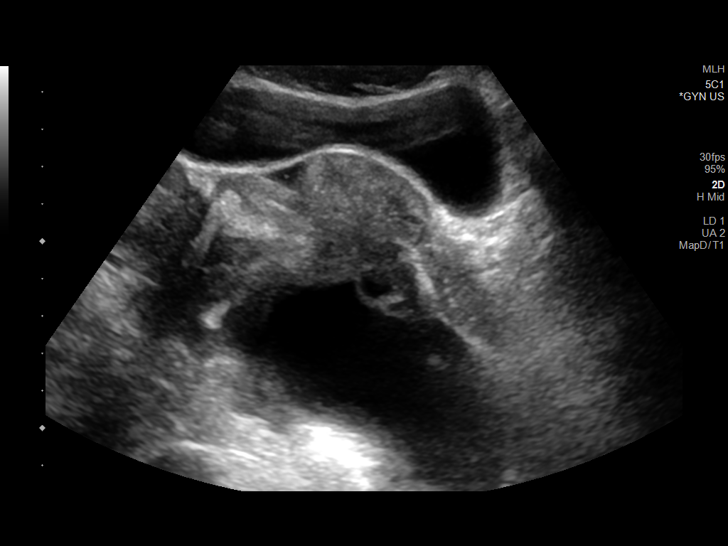
[im 137/150]
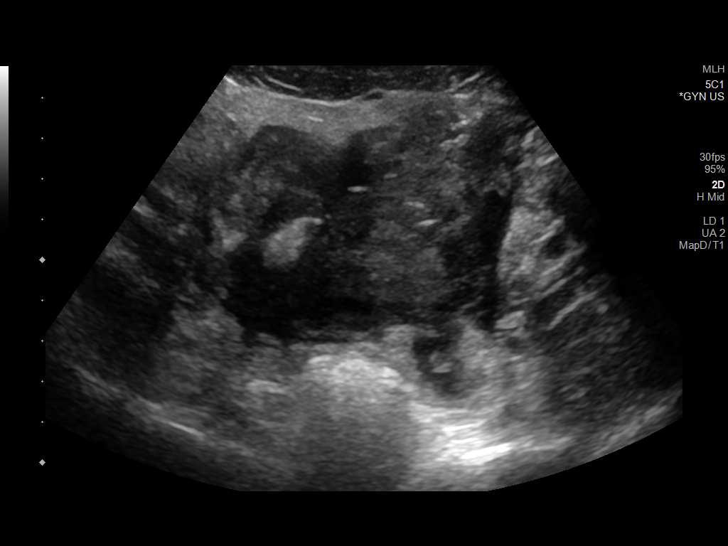
[im 150/150]
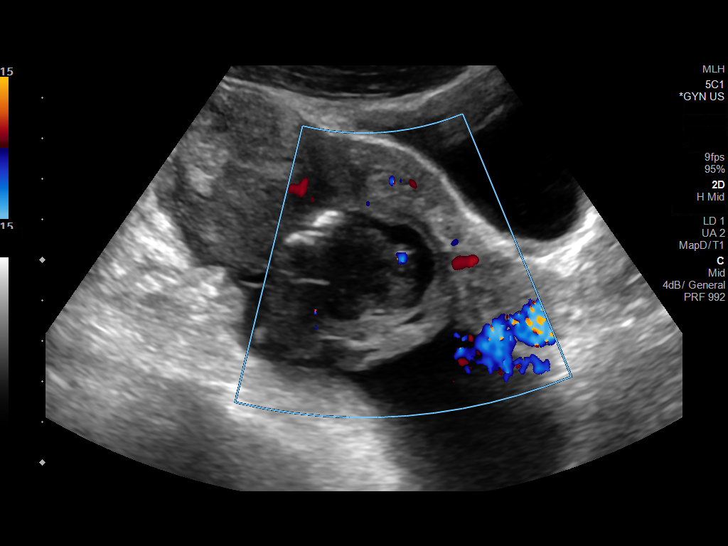

[13 of 25 positions shown; findings below may reference images not displayed]

FINDINGS: Measurements: 6.3 x 2.4 x 3.4 cm = volume: 27 mL. No fibroids or
other mass visualized.

Endometrium

Thickness: 3 mm.  No focal abnormality visualized.

Right ovary

Measurements: 5.9 x 4.1 x 4.3 cm = volume: 54 mL. A right adnexal
mass has multiple thin septations and measures 10.0 x 6.1 x 7.2 cm.

Left ovary

Measurements: 5.0 x 4.7 x 5.3 cm = volume: 65 mL. The left ovary has
a heterogeneous appearance.

Pulsed Doppler evaluation demonstrates normal low-resistance
arterial and venous waveforms in both ovaries.

Other findings: A heterogeneous soft tissue mass superior to the
uterus measures 8.0 x 4.8 x 8.6 cm and demonstrates internal blood
flow. Small volume free fluid is seen in the pelvis.
IMPRESSION: 1. Enlarged and heterogeneous appearing left ovary may reflect
infection or torsion.

2. Large cystic mass in the vicinity of the right adnexa may reflect
hydrosalpinx, pyosalpinx, or an adnexal cyst with multiple thin
septations.

3. Heterogeneous soft tissue mass superior to the uterus is
nonspecific and may reflect a tubo-ovarian abscess.

These results were called by telephone at the time of interpretation
on 10/14/2020 at [DATE] to the patient's provider, who verbally
acknowledged these results.

## 2021-12-27 IMAGING — CT CT PELVIS W/ CM
2 of 3 series · 16 of 46 positions shown, 18 images · IV contrast (omnipaque)
Comparison: MR pelvis dated 10/17/2020. Ultrasound pelvis dated
10/14/2020.

CLINICAL DATA: Tubo-ovarian abscess

EXAM:
CT PELVIS WITH CONTRAST
TECHNIQUE: Multidetector CT imaging of the pelvis was performed using the
standard protocol following the bolus administration of intravenous
contrast.
CONTRAST:  100mL OMNIPAQUE IOHEXOL 300 MG/ML  SOLN

[Series 3: pelvis with 5.0 · axial · 0.69mm/px · z∈[-462,-262]mm · 13 of 48 slices shown, 15 images]
[im 4/48  soft-tissue]
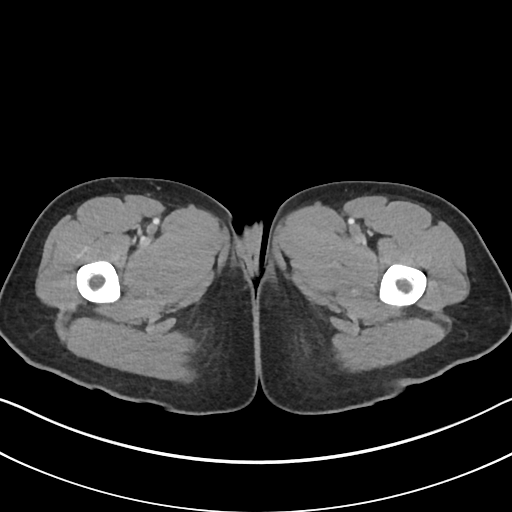
[im 4/48  bone]
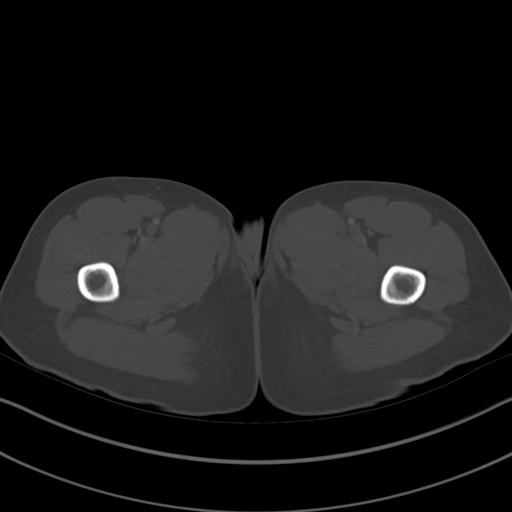
[im 7/48  soft-tissue]
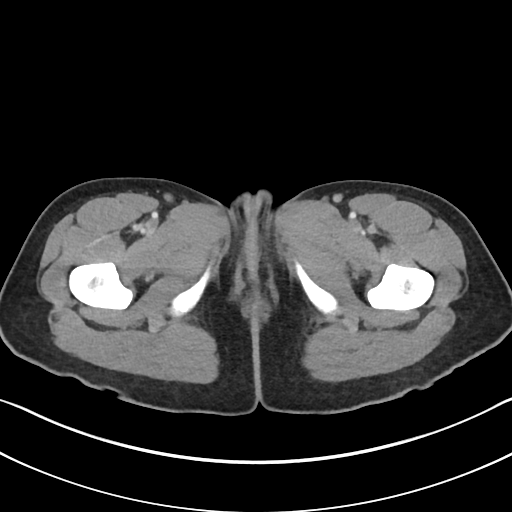
[im 10/48  soft-tissue]
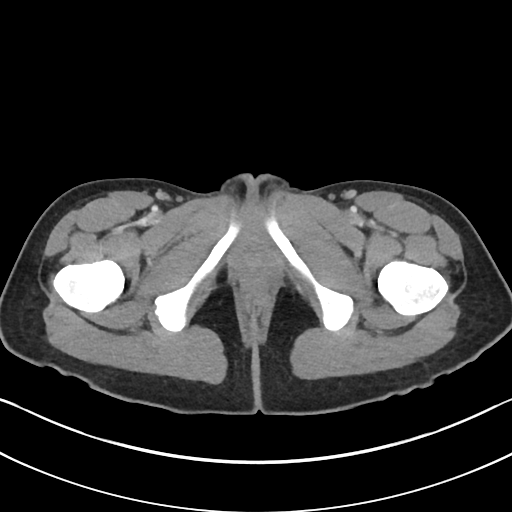
[im 14/48  soft-tissue]
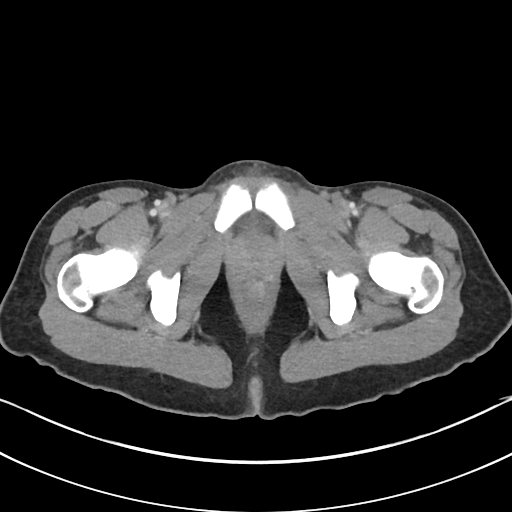
[im 17/48  soft-tissue]
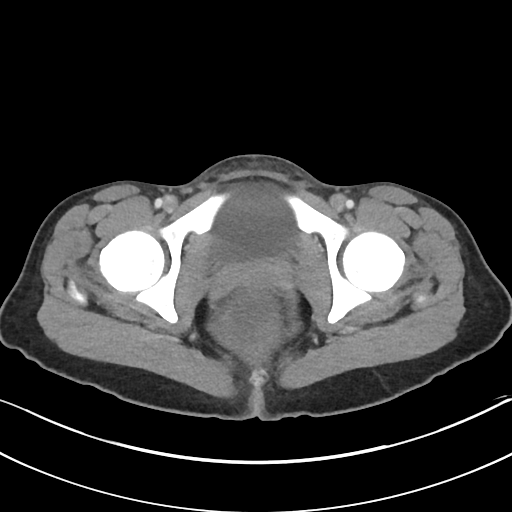
[im 20/48  soft-tissue]
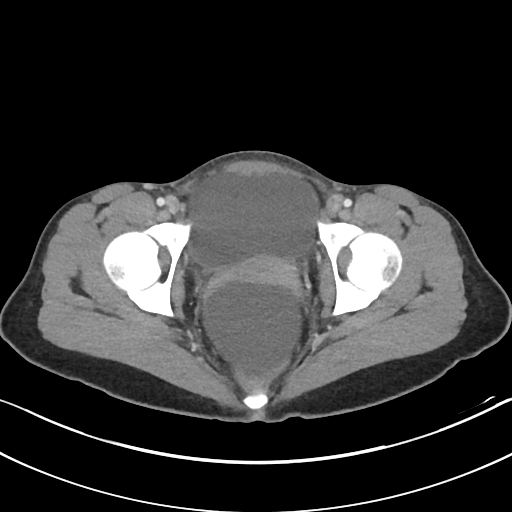
[im 25/48  soft-tissue]
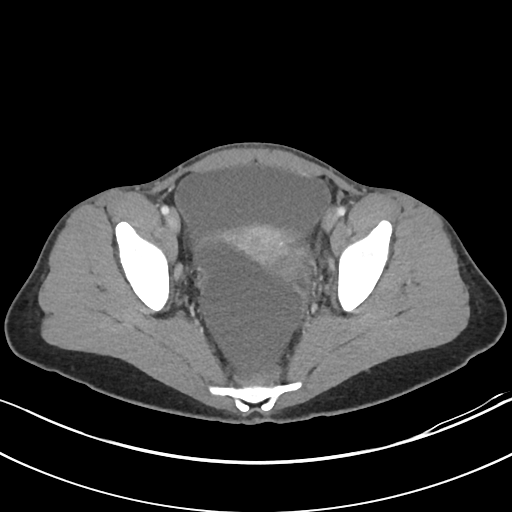
[im 28/48  soft-tissue]
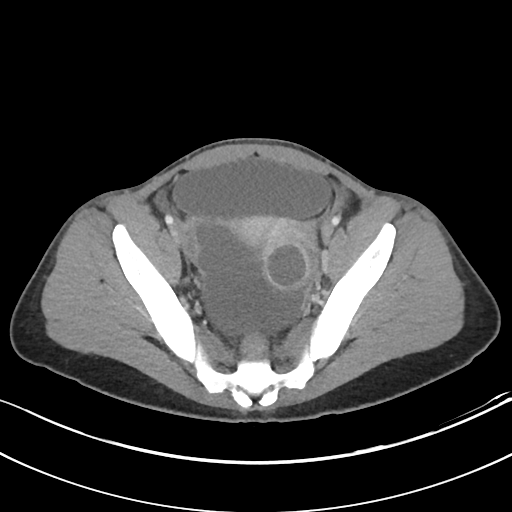
[im 31/48  soft-tissue]
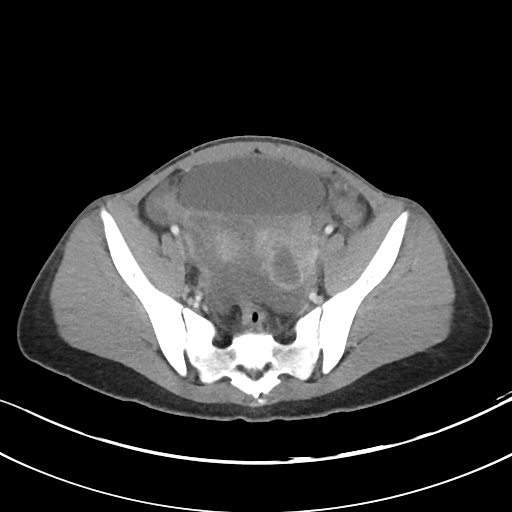
[im 31/48  bone]
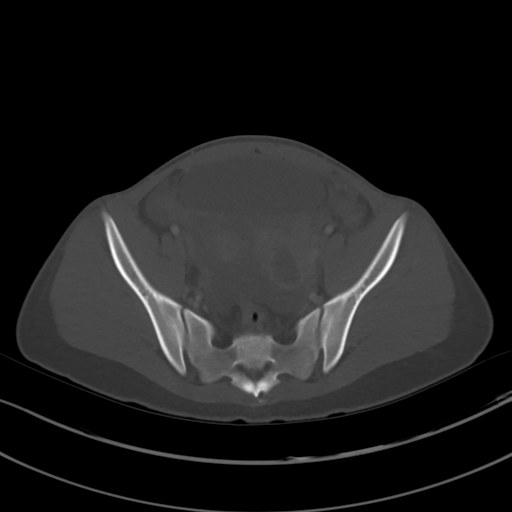
[im 34/48  soft-tissue]
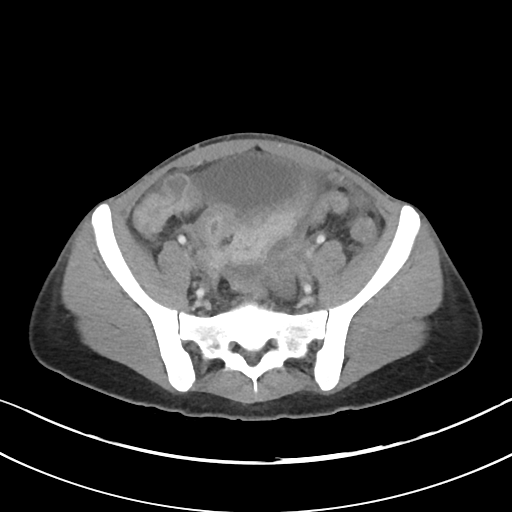
[im 38/48  soft-tissue]
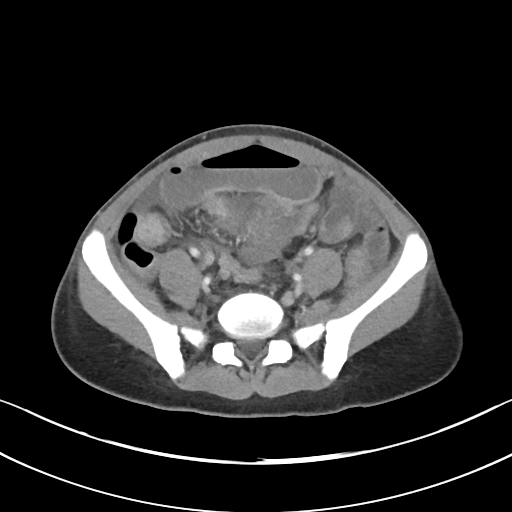
[im 41/48  soft-tissue]
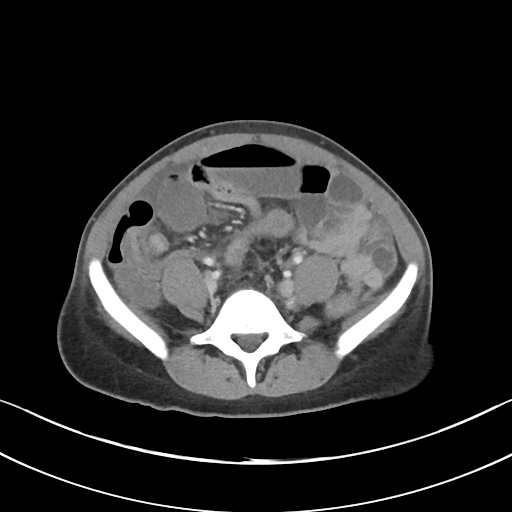
[im 44/48  soft-tissue]
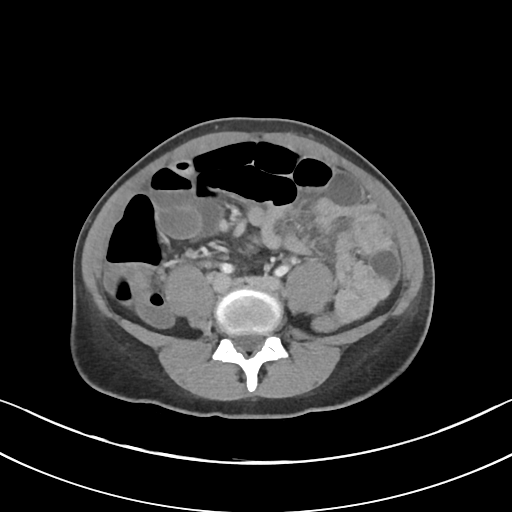

[Series 5: pelvis with 2.0 cor · coronal · 0.47mm/px · 3 of 112 slices shown]
[im 38/112  soft-tissue]
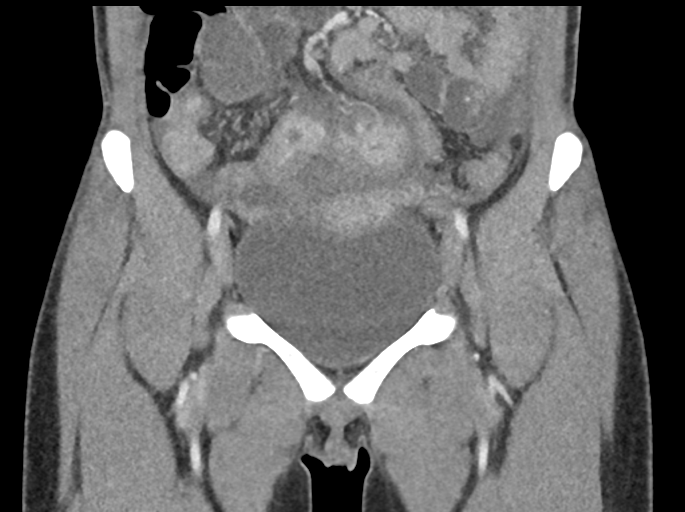
[im 50/112  soft-tissue]
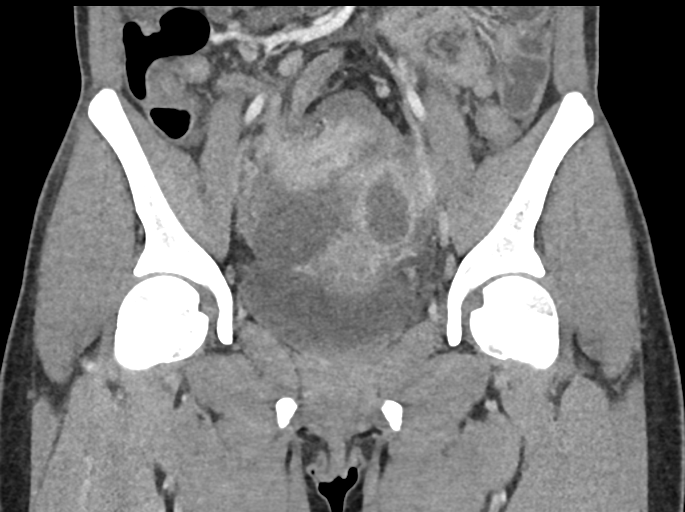
[im 62/112  soft-tissue]
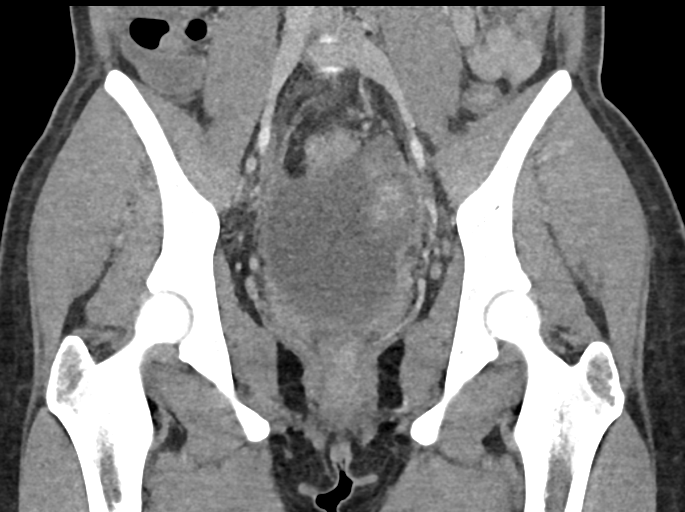

[16 of 46 positions shown; findings below may reference images not displayed]

FINDINGS: Urinary Tract:  Bladder is within normal limits.

Bowel: Long segment wall thickening involving the distal/terminal
ileum (series 3/image 15), underdistended but abnormal.

Vascular/Lymphatic: No evidence of aneurysm.

No suspicious pelvic lymphadenopathy.

Reproductive:  Uterus is within normal limits.

Right ovary is likely within normal limits (series 3/image 20).

3.1 x 3.7 cm irregular enhancing lesion in the left ovary (series
3/image 20; sagittal image 92).

Other: Small to moderate pelvic ascites, mildly masslike. Associated
fluid/ascites along the small bowel mesentery (series 3/images 11
and 14).

Musculoskeletal: Visualized osseous structures are within normal
limits.
IMPRESSION: 3.7 cm irregular enhancing lesion in the left ovary, favoring a
tubo-ovarian abscess on recent MRI.

Long segment wall thickening involving the distal/terminal ileum,
abnormal. This appearance may be reactive given the left adnexal
process, but infectious/inflammatory ileitis is not excluded. At a
minimum, GI follow-up after resolution of the pelvic inflammatory
process is suggested.

Small to moderate pelvic ascites, mildly masslike. Associated
fluid/ascites along the small bowel mesentery.

## 2022-01-18 MED ORDER — SIMETHICONE 125 MG CHEWABLE TABLET
ORAL_TABLET | 0 refills | 0 days | Status: CP
Start: 2022-01-18 — End: ?
  Filled 2022-01-20: qty 4, 2d supply, fill #0

## 2022-01-18 MED ORDER — BISACODYL 5 MG TABLET,DELAYED RELEASE
ORAL_TABLET | Freq: Once | ORAL | 0 refills | 1 days | Status: CP
Start: 2022-01-18 — End: 2022-01-18
  Filled 2022-01-20: qty 2, 1d supply, fill #0

## 2022-01-18 MED ORDER — POLYETHYLENE GLYCOL 3350 17 GRAM/DOSE ORAL POWDER
Freq: Once | ORAL | 0 refills | 1.00000 days | Status: CP
Start: 2022-01-18 — End: 2022-01-18

## 2022-01-19 IMAGING — CT CT PELVIS W/ CM
2 of 3 series · 13 of 46 positions shown, 15 images · IV contrast (Omni 300)
Comparison: 10/18/2020

CLINICAL DATA: Tubo-ovarian abscess

EXAM:
CT PELVIS WITH CONTRAST
TECHNIQUE: Multidetector CT imaging of the pelvis was performed using the
standard protocol following the bolus administration of intravenous
contrast.
CONTRAST:  100mL OMNIPAQUE IOHEXOL 300 MG/ML  SOLN

[Series 5: pelvis with 5.0 · axial · 0.81mm/px · z∈[+722,+922]mm · 10 of 46 slices shown, 12 images]
[im 3/46  soft-tissue]
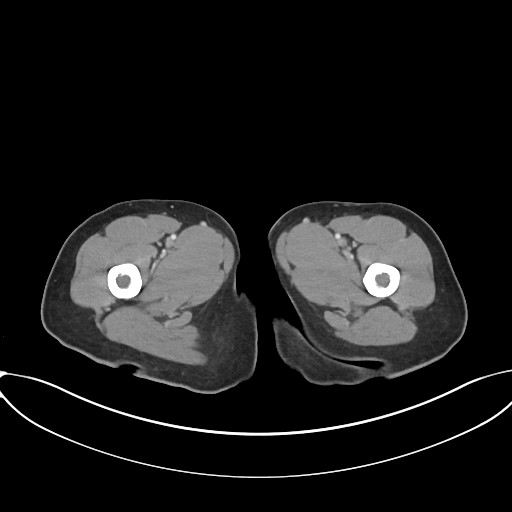
[im 3/46  bone]
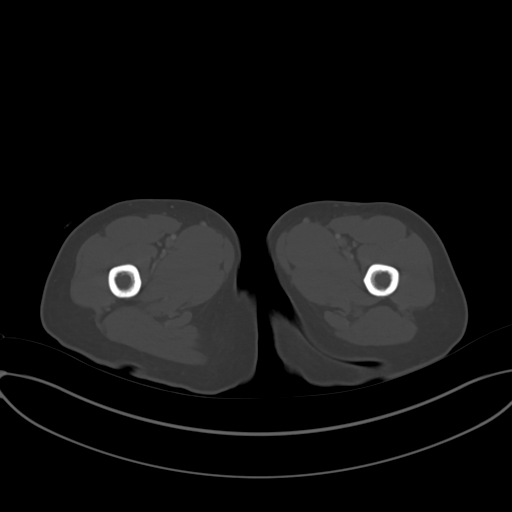
[im 8/46  soft-tissue]
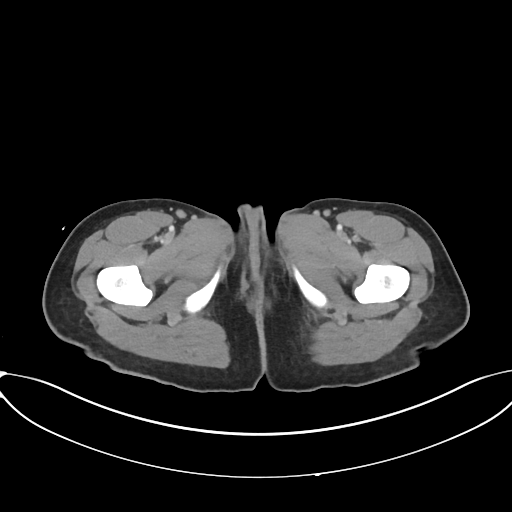
[im 12/46  soft-tissue]
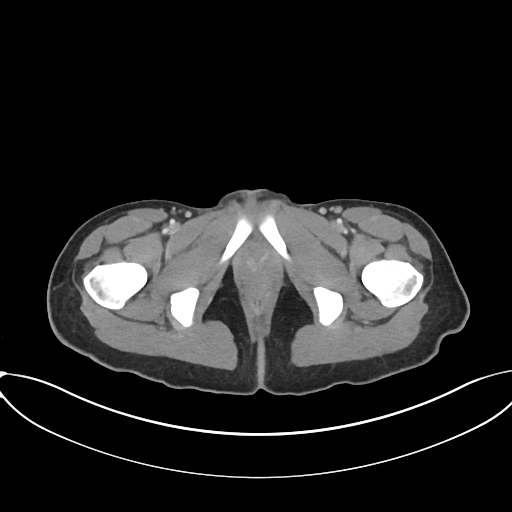
[im 16/46  soft-tissue]
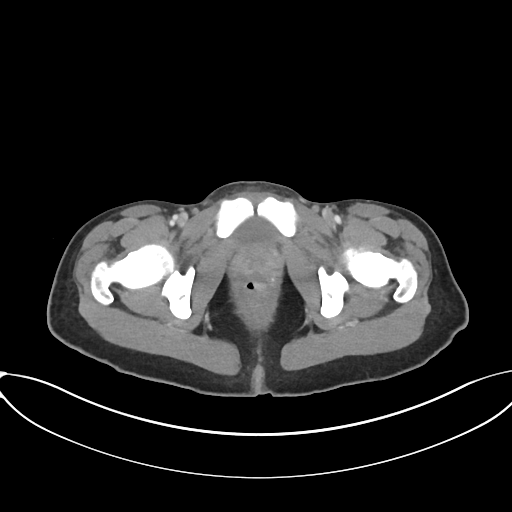
[im 21/46  soft-tissue]
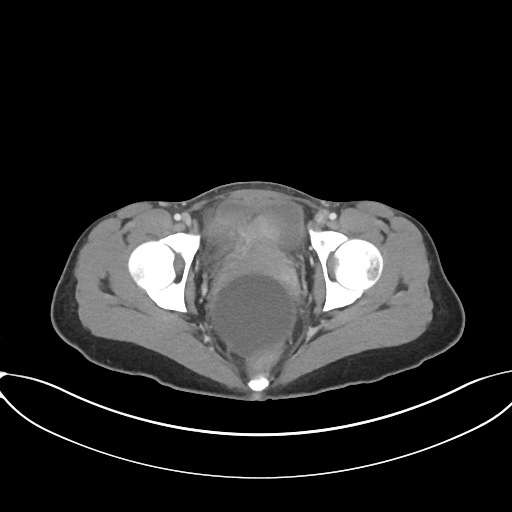
[im 25/46  soft-tissue]
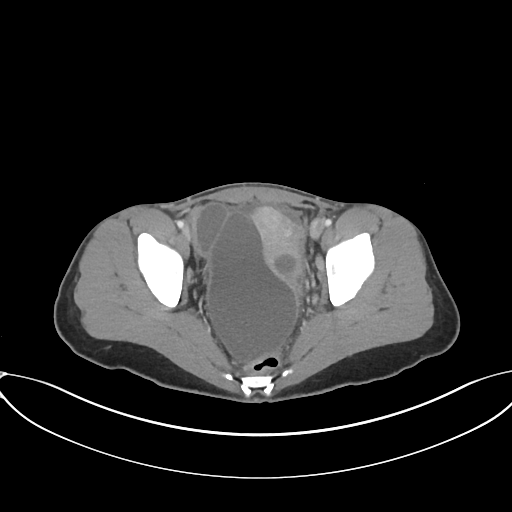
[im 30/46  soft-tissue]
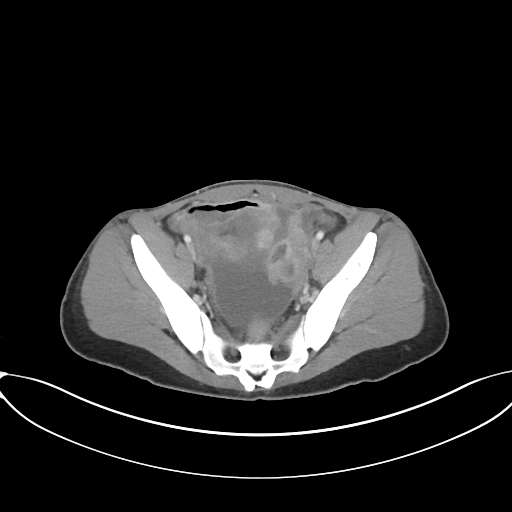
[im 34/46  soft-tissue]
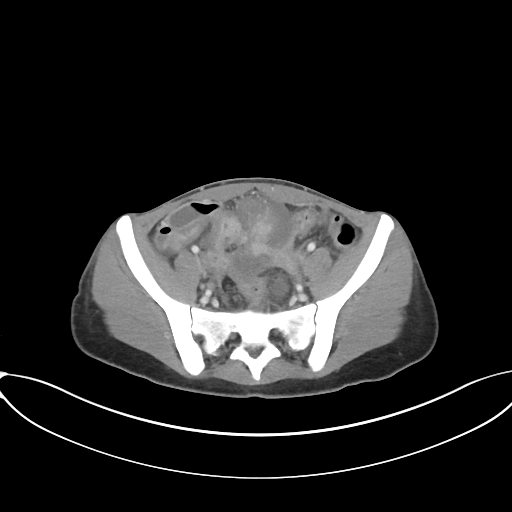
[im 38/46  soft-tissue]
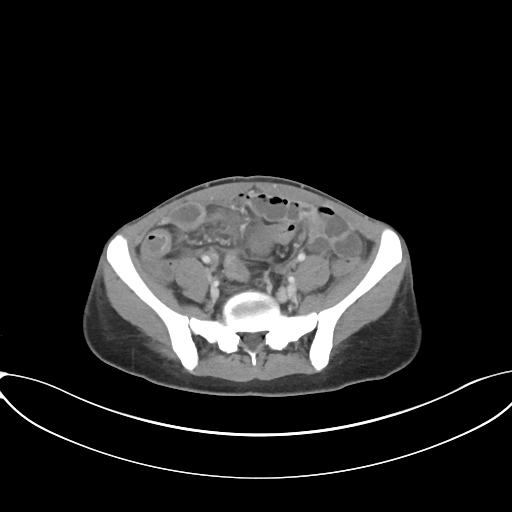
[im 38/46  bone]
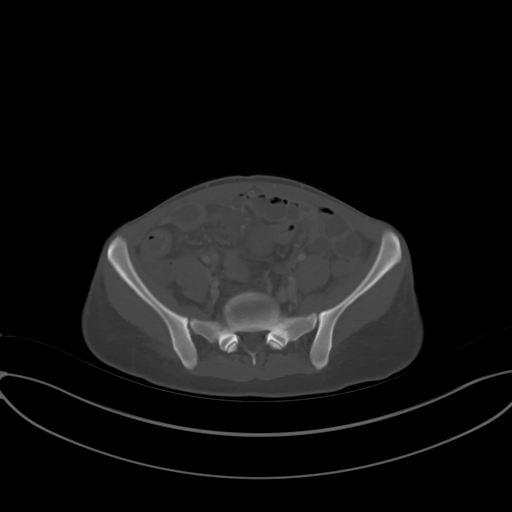
[im 43/46  soft-tissue]
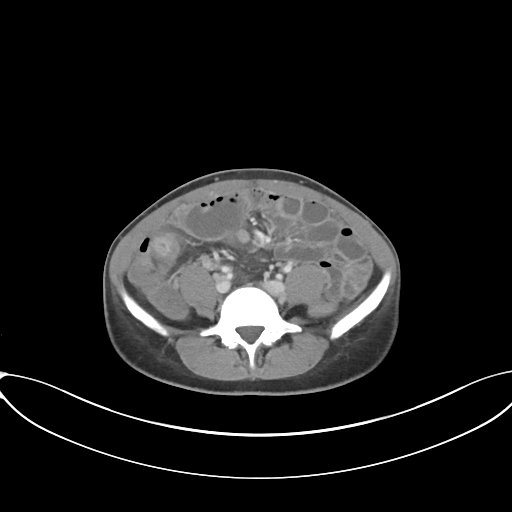

[Series 7: pelvis with 2.0 cor · coronal · 0.48mm/px · 3 of 149 slices shown]
[im 66/149  soft-tissue]
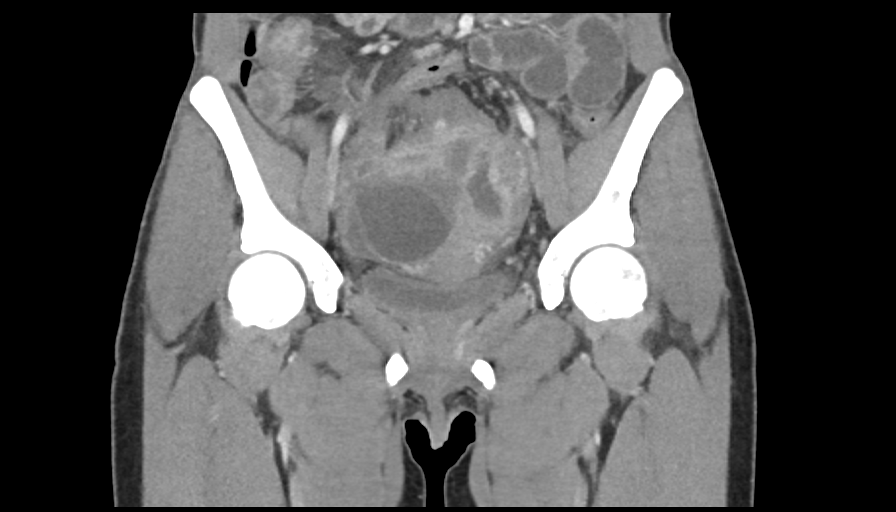
[im 83/149  soft-tissue]
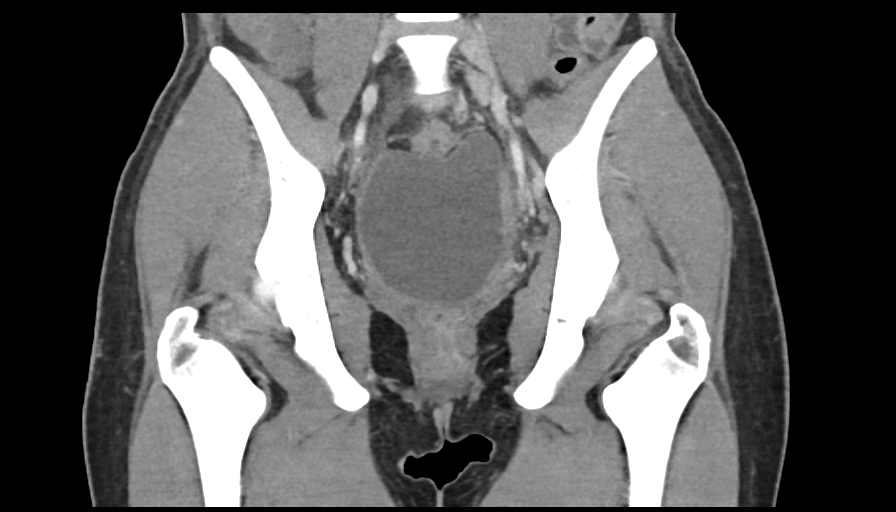
[im 99/149  soft-tissue]
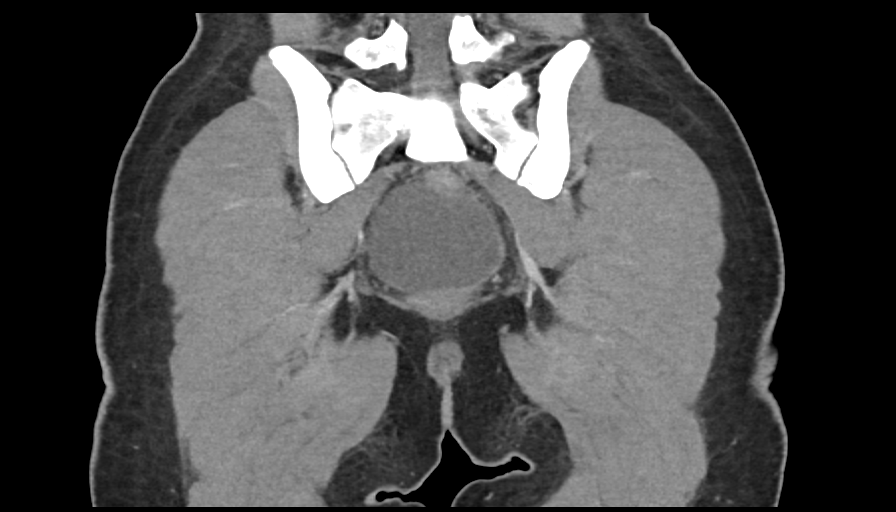

[13 of 46 positions shown; findings below may reference images not displayed]

FINDINGS: Urinary Tract: Urinary bladder is under distended limiting
assessment. Less distended than on the prior exam. No distal
ureteral dilation.

Bowel: Diffuse mural stratification and irregular thickening of
small bowel loops in the pelvis. Complex fistulous network involving
the distal ileum with tract extending from the distal ileum into the
LEFT adnexa where there is a peripherally enhancing fluid containing
structure that is similar to the prior study. This area measures
approximately 3.2 x 2.4 cm (image 19 of series 5.

Displacing bowel loops in the pelvis is a 12 x 6.8 cm area of fluid
density with peripheral septation that has enlarged since the prior
study where it measured 10 x 6 cm.

Pseudo sacculation along the distal ileum is present with transmural
fistulae and overt fistulization to LEFT adnexa and with presumed
Klever ization also to the sigmoid colon, LEFT adnexal component
best seen on image 67 of series 7. Enterocolonic fistula is a shin
to sigmoid best seen on sagittal image 78 of series 8. Fistulization
of LEFT adnexa can be followed on images 94-103 as well into the
LEFT adnexa. Extensive stranding about the pelvis with similar
appearance to prior imaging. No free air to exhibited in the lower
abdomen/upper pelvis. Potential fistulization as well to the
appendix which is at the same level of potentials fistulization to
the sigmoid colon. Upstream bowel loops are dilated with similar
pattern to previous imaging.

Vascular/Lymphatic: Patent pelvic vasculature grossly. Venous
structures not well assessed. No pelvic lymphadenopathy.

Reproductive: Uterus displaced from fluid collection in the RIGHT
pelvis.

Other: Mesenteric stranding in the low abdomen. This finding is
similar to the previous imaging study.

Musculoskeletal: Subtle sclerosis in the subchondral region of the
bilateral femoral heads raising the question of early avascular
necrosis.
IMPRESSION: 1. Complex fistulous network in the pelvis leading to abscess in the
LEFT adnexa in the setting of presumed inflammatory bowel disease,
most suggestive of Crohn's disease. Correlate with patient history
for repeated episodes of abdominal pain.
2. Enlarging more simple appearing fluid in the pelvis in the RIGHT
pelvis displacing structures from RIGHT to LEFT likely a peritoneal
inclusion cyst in the setting of repeated inflammation based on the
appearance of small bowel loops. This could also represent a
hydrosalpinx. Correlate with fluid sampling which was performed on
October 19, 2020 possibility of interval super infection is
considered though the appearance while enlarged is otherwise similar
to previous imaging. The
3. Upstream bowel loops are dilated likely related to partial
obstruction but unchanged from previous exam.
4. Subtle sclerosis in the subchondral region of the bilateral
femoral heads raising the question of early avascular necrosis.

These results will be called to the ordering clinician or
representative by the Radiologist Assistant, and communication
documented in the PACS or [REDACTED].

## 2022-01-20 MED FILL — POLYETHYLENE GLYCOL 3350 17 GRAM/DOSE ORAL POWDER: ORAL | 1 days supply | Qty: 119 | Fill #0

## 2022-01-20 MED FILL — POLYETHYLENE GLYCOL 3350 17 GRAM/DOSE ORAL POWDER: ORAL | 1 days supply | Qty: 238 | Fill #0

## 2022-02-03 ENCOUNTER — Encounter: Admit: 2022-02-03 | Discharge: 2022-02-07 | Payer: PRIVATE HEALTH INSURANCE

## 2022-02-03 ENCOUNTER — Ambulatory Visit: Admit: 2022-02-03 | Discharge: 2022-02-07 | Payer: PRIVATE HEALTH INSURANCE

## 2022-04-12 DIAGNOSIS — K50919 Crohn's disease, unspecified, with unspecified complications: Principal | ICD-10-CM

## 2022-04-12 MED ORDER — HUMIRA PEN CITRATE FREE 40 MG/0.4 ML
SUBCUTANEOUS | 1 refills | 84 days | Status: CP
Start: 2022-04-12 — End: ?
  Filled 2022-07-10: qty 2, 28d supply, fill #0

## 2022-04-13 ENCOUNTER — Other Ambulatory Visit (HOSPITAL_BASED_OUTPATIENT_CLINIC_OR_DEPARTMENT_OTHER): Payer: Self-pay

## 2022-04-13 MED ORDER — COLESTIPOL 1 GRAM TABLET
ORAL_TABLET | 11 refills | 0 days | Status: CP
Start: 2022-04-13 — End: ?

## 2022-04-13 MED ORDER — COLESTIPOL HCL 1 G PO TABS
ORAL_TABLET | ORAL | 11 refills | Status: DC
  Filled 2022-04-13: qty 60, 30d supply, fill #0

## 2022-04-20 NOTE — Unmapped (Signed)
[facility] Specialty Medication Onboarding    Specialty Medication: HUMIRA(CF) PEN 40 mg/0.4 mL injection (adalimumab)  Prior Authorization: Approved   Financial Assistance: Yes - copay card approved as secondary   Final Copay/Day Supply: $5 / 28    Insurance Restrictions: Yes - max 1 month supply     Notes to Pharmacist: n/a    The triage team has completed the benefits investigation and has determined that the patient is able to fill this medication at [facility]. Please contact the patient to complete the onboarding or follow up with the prescribing physician as needed.

## 2022-04-21 NOTE — Unmapped (Addendum)
Patient has been getting Humira from the manufacturer. She currently has 4 boxes left. Last dose 04/21/22. Scheduled a follow up call for 8/15. She knows to call us if she needs anything before that phone call.      [facility]   Patient Onboarding/Medication Counseling    Erica Henry is a 21 y.o. female with crohn's disease who I am counseling today on continuation of therapy.  I am speaking to the patient.    Was a Nurse, learning disability used for this call? No    Verified patient's date of birth / HIPAA.    Specialty medication(s) to be sent: Inflammatory Disorders: Humira      Non-specialty medications/supplies to be sent: n/a      Medications not needed at this time: sharps kit - patient has one at home       Humira (adalimumab)    The patient declined counseling on medication administration, missed dose instructions, goals of therapy, side effects and monitoring parameters, warnings and precautions, and drug/food interactions because they have taken the medication previously. The information in the declined sections below are for informational purposes only and was not discussed with patient.       Medication & Administration     Dosage:  Crohn's Disease: Inject 40mg  under the skin every 14 days        Lab tests required prior to treatment initiation:  Tuberculosis: Tuberculosis screening resulted in a non-reactive Quantiferon TB Gold assay.  Hepatitis B: Hepatitis B serology studies are complete and non-reactive.    Administration:     Prefilled auto-injector pen  1. Gather all supplies needed for injection on a clean, flat working surface: medication pen removed from packaging, alcohol swab, sharps container, etc.  2. Look at the medication label - look for correct medication, correct dose, and check the expiration date  3. Look at the medication - the liquid visible in the window on the side of the pen device should appear clear and colorless  4. Lay the auto-injector pen on a flat surface and allow it to warm up to room temperature for at least 30-45 minutes  5. Select injection site - you can use the front of your thigh or your belly (but not the area 2 inches around your belly button); if someone else is giving you the injection you can also use your upper arm in the skin covering your triceps muscle  6. Prepare injection site - wash your hands and clean the skin at the injection site with an alcohol swab and let it air dry, do not touch the injection site again before the injection  7. Pull the 2 safety caps straight off - gray/white to uncover the needle cover and the plum cap to uncover the plum activator button, do not remove until immediately prior to injection and do not touch the white needle cover  8. Gently squeeze the area of cleaned skin and hold it firmly to create a firm surface at the selected injection site  9. Put the white needle cover against your skin at the injection site at a 90 degree angle, hold the pen such that you can see the clear medication window  10. Press down and hold the pen firmly against your skin, press the plum activator button to initiate the injection, there will be a click when the injection starts  11. Continue to hold the pen firmly against your skin for about 10-15 seconds - the window will start to turn  solid yellow  12. To verify the injection is complete after 10-15 seconds, look and ensure the window is solid yellow and then pull the pen away from your skin  13. Dispose of the used auto-injector pen immediately in your sharps disposal container the needle will be covered automatically  14. If you see any blood at the injection site, press a cotton ball or gauze on the site and maintain pressure until the bleeding stops, do not rub the injection site    Adherence/Missed dose instructions:  If your injection is given more than 3 days after your scheduled injection date - consult your pharmacist for additional instructions on how to adjust your dosing schedule.    Goals of Therapy     - Achieve remission of symptoms  - Maintain remission of symptoms  - Minimize long-term systemic glucocorticoid use  - Prevent need for surgical procedures  - Maintenance of effective psychosocial functioning    Side Effects & Monitoring Parameters     Injection site reaction (redness, irritation, inflammation localized to the site of administration)  Signs of a common cold - minor sore throat, runny or stuffy nose, etc.  Upset stomach  Headache    The following side effects should be reported to the provider:  Signs of a hypersensitivity reaction - rash; hives; itching; red, swollen, blistered, or peeling skin; wheezing; tightness in the chest or throat; difficulty breathing, swallowing, or talking; swelling of the mouth, face, lips, tongue, or throat; etc.  Reduced immune function - report signs of infection such as fever; chills; body aches; very bad sore throat; ear or sinus pain; cough; more sputum or change in color of sputum; pain with passing urine; wound that will not heal, etc.  Also at a slightly higher risk of some malignancies (mainly skin and blood cancers) due to this reduced immune function.  In the case of signs of infection - the patient should hold the next dose of Humira?? and call your primary care provider to ensure adequate medical care.  Treatment may be resumed when infection is treated and patient is asymptomatic.  Changes in skin - a new growth or lump that forms; changes in shape, size, or color of a previous mole or marking  Signs of unexplained bruising or bleeding - throwing up blood or emesis that looks like coffee grounds; black, tarry, or bloody stool; etc.  Signs of new or worsening heart failure - shortness of breath; sudden weight gain; heartbeat that is not normal; swelling in the arms or legs that is new or worse      Contraindications, Warnings, & Precautions     Have your bloodwork checked as you have been told by your prescriber  Talk with your doctor if you are pregnant, planning to become pregnant, or breastfeeding  Discuss the possible need for holding your dose(s) of Humira?? when a planned procedure is scheduled with the prescriber as it may delay healing/recovery timeline       Drug/Food Interactions     Medication list reviewed in Epic. The patient was instructed to inform the care team before taking any new medications or supplements. No drug interactions identified.   Talk with you prescriber or pharmacist before receiving any live vaccinations while taking this medication and after you stop taking it    Storage, Handling Precautions, & Disposal     Store this medication in the refrigerator.  Do not freeze  If needed, you may store at room temperature for up to 14  days  Store in [employer], protected from light  Do not shake  Dispose of used syringes/pens in a sharps disposal container            Current Medications (including OTC/herbals), Comorbidities and Allergies     Current Outpatient Medications   Medication Sig Dispense Refill    colestipoL (COLESTID) 1 gram tablet Take 2 tablets in the morning. If necessary 1-2 tablets  approx. 10 minutes  before large meals. Take other medications 2-3 hours apart. 180 tablet 11    HUMIRA PEN CITRATE FREE 40 MG/0.4 ML Inject the contents of 1 pen (40 mg total) under the skin every fourteen (14) days. 6 each 1    simethicone (MYLICON) 125 MG chewable tablet Take 2 tablets (250 mg) at 5:00 pm the day before your procedure. Take remaining 2 tablets (250 mg) at least 4 hours before your procedure time. 4 tablet 0     No current facility-administered medications for this visit.       No Known Allergies    Patient Active Problem List   Diagnosis    Pelvic abscess in female    Severe protein-calorie malnutrition (CMS-HCC)    Iron deficiency anemia    Crohn's disease of small intestine (CMS-HCC)    Abscess    Anemia    Thalassemia, unspecified       Reviewed and up to date in Epic.    Appropriateness of Therapy     Acute infections noted within Epic:  No active infections  Patient reported infection: None    Is medication and dose appropriate based on diagnosis and infection status? Yes    Prescription has been clinically reviewed: Yes      Baseline Quality of Life Assessment      How many days over the past month did your crohn's disease  keep you from your normal activities? For example, brushing your teeth or getting up in the morning. 0    Financial Information     Medication Assistance provided: Prior Authorization and Copay Assistance    Anticipated copay of $5 / 28 days reviewed with patient. Verified delivery address.    Delivery Information     Scheduled delivery date: n/a - patient has 4 boxes of Humira left from previous shipments from manufacturer    Expected start date: n/a    Last dose 04/21/22. Scheduled a follow up call for 8/15. She knows to call us if she needs anything before that phone call.      Explained the services we provide at [facility] and that each month we would call to set up refills.  Stressed importance of returning phone calls so that we could ensure they receive their medications in time each month.  Informed patient that we should be setting up refills 7-10 days prior to when they will run out of medication.  A pharmacist will reach out to perform a clinical assessment periodically.  Informed patient that a welcome packet, containing information about our pharmacy and other support services, a Notice of Privacy Practices, and a drug information handout will be sent.      The patient or caregiver noted above participated in the development of this care plan and knows that they can request review of or adjustments to the care plan at any time.      Patient or caregiver verbalized understanding of the above information as well as how to contact the pharmacy at [phone] option 4 with any  questions/concerns.  The pharmacy is open Monday through Friday 8:30am-4:30pm.  A pharmacist is available 24/7 via pager to answer any clinical questions they may have.    Patient Specific Needs     Does the patient have any physical, cognitive, or cultural barriers? No    Does the patient have adequate living arrangements? (i.e. the ability to store and take their medication appropriately) Yes    Did you identify any home environmental safety or security hazards? No    Patient prefers to have medications discussed with  Patient     Is the patient or caregiver able to read and understand education materials at a high school level or above? Yes    Patient's primary language is  English     Is the patient high risk? No    SOCIAL DETERMINANTS OF HEALTH     At the [facility], we have learned that life circumstances - like trouble affording food, housing, utilities, or transportation can affect the health of many of our patients.   That is why we wanted to ask: are you currently experiencing any life circumstances that are negatively impacting your health and/or quality of life? Patient declined to answer    Social Determinants of Health     Financial Resource Strain: Not on file   Internet Connectivity: Not on file   Food Insecurity: Not on file   Tobacco Use: Low Risk     Smoking Tobacco Use: Never    Smokeless Tobacco Use: Never    Passive Exposure: Not on file   Housing/Utilities: Not on file   Alcohol Use: Not on file   Transportation Needs: Not on file   Substance Use: Not on file   Health Literacy: Not on file   Physical Activity: Not on file   Interpersonal Safety: Not on file   Stress: Not on file   Intimate Partner Violence: Not on file   Depression: Not on file   Social Connections: Not on file       Would you be willing to receive help with any of the needs that you have identified today? Not applicable       Clydell Hakim  [facility] Specialty Pharmacist

## 2022-06-08 NOTE — Unmapped (Signed)
Addendum  created 06/07/22 2324 by Val Eagle, MD    Clinical Note Signed, Delete clinical note, Intraprocedure Blocks edited, Order Canceled from Note, Pend clinical note

## 2022-06-28 NOTE — Unmapped (Signed)
[facility] Refill Coordination Note    Specialty Medication(s) to be Shipped:   Inflammatory Disorders: Humira    Other medication(s) to be shipped: No additional medications requested for fill at this time     Erica Henry, DOB: 2001-02-23  Phone: There are no phone numbers on file.      All above HIPAA information was verified with patient.     Was a Nurse, learning disability used for this call? No    Completed refill call assessment today to schedule patient's medication shipment from the [facility] [phone]).  All relevant notes have been reviewed.     Specialty medication(s) and dose(s) confirmed: Regimen is correct and unchanged.   Changes to medications: Erica Henry reports no changes at this time.  Changes to insurance: No  New side effects reported not previously addressed with a pharmacist or physician: None reported  Questions for the pharmacist: No    Confirmed patient received a Conservation officer, historic buildings and a Surveyor, mining with first shipment. The patient will receive a drug information handout for each medication shipped and additional FDA Medication Guides as required.       DISEASE/MEDICATION-SPECIFIC INFORMATION        For patients on injectable medications: Patient currently has 1 doses left.  Next injection is scheduled for 07/02/22.    SPECIALTY MEDICATION ADHERENCE     Medication Adherence    Patient reported X missed doses in the last month: 0  Specialty Medication: HUMIRA(CF) PEN 40 mg/0.4 mL injection (adalimumab)  Patient is on additional specialty medications: No  Patient is on more than two specialty medications: No  Informant: patient  Reliability of informant: reliable  Reasons for non-adherence: no problems identified              Were doses missed due to medication being on hold? No    HUMIRA(CF) PEN 40 mg/0.4 mL injection   : 7 days of medicine on hand       REFERRAL TO PHARMACIST     Referral to the pharmacist: Not needed      [facility]     Shipping address confirmed in Epic.     Delivery Scheduled: Yes, Expected medication delivery date: 07/05/22.     Medication will be delivered via UPS to the prescription address in Epic WAM.    Azuree Minish' [address] [facility] Specialty Technician

## 2022-06-29 ENCOUNTER — Ambulatory Visit
Admit: 2022-06-29 | Discharge: 2022-06-30 | Payer: PRIVATE HEALTH INSURANCE | Attending: Gastroenterology | Primary: Gastroenterology

## 2022-06-29 ENCOUNTER — Other Ambulatory Visit (HOSPITAL_BASED_OUTPATIENT_CLINIC_OR_DEPARTMENT_OTHER): Payer: Self-pay

## 2022-06-29 DIAGNOSIS — E538 Deficiency of other specified B group vitamins: Principal | ICD-10-CM

## 2022-06-29 DIAGNOSIS — Z79899 Other long term (current) drug therapy: Principal | ICD-10-CM

## 2022-06-29 DIAGNOSIS — E611 Iron deficiency: Principal | ICD-10-CM

## 2022-06-29 DIAGNOSIS — K50919 Crohn's disease, unspecified, with unspecified complications: Principal | ICD-10-CM

## 2022-06-29 DIAGNOSIS — D84821 Immunosuppression due to drug therapy (CMS-HCC): Principal | ICD-10-CM

## 2022-06-29 LAB — IRON PANEL
IRON SATURATION: 20 % (ref 20–55)
IRON: 58 ug/dL
TOTAL IRON BINDING CAPACITY: 295 ug/dL (ref 250–425)

## 2022-06-29 LAB — CBC W/ AUTO DIFF
BASOPHILS ABSOLUTE COUNT: 0 10*9/L (ref 0.0–0.1)
BASOPHILS RELATIVE PERCENT: 0.8 %
EOSINOPHILS ABSOLUTE COUNT: 0.1 10*9/L (ref 0.0–0.5)
EOSINOPHILS RELATIVE PERCENT: 1.5 %
HEMATOCRIT: 36.7 % (ref 34.0–44.0)
HEMOGLOBIN: 11.3 g/dL (ref 11.3–14.9)
LYMPHOCYTES ABSOLUTE COUNT: 1.8 10*9/L (ref 1.1–3.6)
LYMPHOCYTES RELATIVE PERCENT: 32.9 %
MEAN CORPUSCULAR HEMOGLOBIN CONC: 30.8 g/dL — ABNORMAL LOW (ref 32.0–36.0)
MEAN CORPUSCULAR HEMOGLOBIN: 22 pg — ABNORMAL LOW (ref 25.9–32.4)
MEAN CORPUSCULAR VOLUME: 71.5 fL — ABNORMAL LOW (ref 77.6–95.7)
MEAN PLATELET VOLUME: 7.7 fL (ref 6.8–10.7)
MONOCYTES ABSOLUTE COUNT: 0.3 10*9/L (ref 0.3–0.8)
MONOCYTES RELATIVE PERCENT: 5.9 %
NEUTROPHILS ABSOLUTE COUNT: 3.2 10*9/L (ref 1.8–7.8)
NEUTROPHILS RELATIVE PERCENT: 58.9 %
PLATELET COUNT: 425 10*9/L (ref 150–450)
RED BLOOD CELL COUNT: 5.13 10*12/L (ref 3.95–5.13)
RED CELL DISTRIBUTION WIDTH: 14.1 % (ref 12.2–15.2)
WBC ADJUSTED: 5.5 10*9/L (ref 3.6–11.2)

## 2022-06-29 LAB — COMPREHENSIVE METABOLIC PANEL
ALBUMIN: 4.1 g/dL (ref 3.4–5.0)
ALKALINE PHOSPHATASE: 61 U/L (ref 46–116)
ALT (SGPT): 9 U/L — ABNORMAL LOW (ref 10–49)
ANION GAP: 7 mmol/L (ref 5–14)
AST (SGOT): 16 U/L (ref <=34)
BILIRUBIN TOTAL: 1.7 mg/dL — ABNORMAL HIGH (ref 0.3–1.2)
BLOOD UREA NITROGEN: 11 mg/dL (ref 9–23)
BUN / CREAT RATIO: 13
CALCIUM: 9.2 mg/dL (ref 8.7–10.4)
CHLORIDE: 105 mmol/L (ref 98–107)
CO2: 25.9 mmol/L (ref 20.0–31.0)
CREATININE: 0.88 mg/dL — ABNORMAL HIGH
EGFR CKD-EPI (2021) FEMALE: >90 mL/min/{1.73_m2} (ref >=60)
GLUCOSE RANDOM: 88 mg/dL (ref 70–179)
POTASSIUM: 3.2 mmol/L — ABNORMAL LOW (ref 3.4–4.8)
PROTEIN TOTAL: 7.5 g/dL (ref 5.7–8.2)
SODIUM: 138 mmol/L (ref 135–145)

## 2022-06-29 LAB — VITAMIN B12: VITAMIN B-12: 367 pg/mL (ref 211–911)

## 2022-06-29 LAB — FERRITIN: FERRITIN: 32 ng/mL

## 2022-06-29 LAB — C-REACTIVE PROTEIN: C-REACTIVE PROTEIN: <4 mg/L (ref <=10.0)

## 2022-06-29 MED ORDER — COLESTIPOL 1 GRAM TABLET
ORAL_TABLET | Freq: Two times a day (BID) | ORAL | 3 refills | 90 days | Status: CP
Start: 2022-06-29 — End: 2023-06-29

## 2022-06-29 MED ORDER — CLOBETASOL 0.05 % TOPICAL OINTMENT
Freq: Two times a day (BID) | TOPICAL | 0 refills | 0 days | Status: CP
Start: 2022-06-29 — End: 2023-06-29

## 2022-06-29 MED ORDER — COLESTIPOL HCL 1 G PO TABS
ORAL_TABLET | ORAL | 3 refills | Status: AC
  Filled 2022-06-29: qty 120, 30d supply, fill #0
  Filled 2022-07-17: qty 240, 60d supply, fill #0
  Filled 2022-07-18: qty 120, 30d supply, fill #0

## 2022-06-29 MED ORDER — CLOBETASOL PROPIONATE 0.05 % EX OINT
TOPICAL_OINTMENT | CUTANEOUS | 0 refills | Status: AC
  Filled 2022-06-29: qty 30, 15d supply, fill #0

## 2022-06-29 NOTE — Unmapped (Addendum)
Prevnar vaccine (for prevention of pneumonia)    Get shingles vaccine , 2 shots 2-6 months apart (optimal timing second shot 4 months later).       Continue Humira every 2 weeks    Labs today    Colonoscopy 01/2023

## 2022-06-29 NOTE — Unmapped (Unsigned)
[facility]   INFLAMMATORY BOWEL DISEASE CLINIC  FOLLOW-UP CONSULT NOTE             Referring physician:   Modena Nunnery, MD  [address]  [id number]  [place],  [place] [address]    Today, I saw Erica Henry for initial consultation in the [facility] regarding Crohn's Disease.      Outside records including available clinical notes, endoscopy reports, imaging results and pathology results were personally reviewed in detail as part of this initial consultation and are summarized in IBD History section below.  These records were obtained from the patient & EMR.    HPI: Erica Henry is a 21 year old African-American lady comes to clinic today to follow-up care for penetrating ileal Crohn's disease, characterized by a terminal ileal stricture and intra-abdominal abscesses.       Patient is currently doing well, 2-3 bowel movements daily, increase of bowel frequency after fatty food.  Stable weight but also no weight increase.  No abdominal pain.  No fever, no extraintestinal manifestations of inflammatory bowel disease.    Erica Henry for Crohn's Disease  General Well Being: 0 = Very well  Abdominal Pain:  0 = none  Number of Liquid Stools Per day: 2  Abdominal Mass:  0 = None  Number of Extraintestinal Manifestations:  0   1 point each for: 1. Arthritis/arthralgias, 2. Iritis/uveitis, 3. Active perianal dz, 4. Other active fistula, 5. Erythema nodosum or pyoderma, 6. Other    Total HBI Score (0-25):  2     Score:  Remission < 5;  Mild dz 5-7;  Moderate dz 8-16;  Severe > 16      Review of Systems: Positive as above. Otherwise, the balance of all systems is negative.     IBD HISTORY:     Disease phenotype: Penetrating Crohn's disease    Year of disease onset:  2020      Brief IBD Disease Course:    - 2020/early 2021: Had non-localized abdominal pain. Work-up in [place]. Had a colonoscopy in Nov 2020 and was told that it was normal (no records).  - Sept 2021: Moved to [place]. Abdominal pain got worse. Was found to have abdominal abscesses- drained and was started on antibiotics.   - Jan 2022: Admitted to [facility] with intra-abdominal abscess seen on CT.  Drain placed.  Underwent a colonoscopy that showed TI stricturing that was not traversed.  Based on these findings, she was determined to have penetrating Crohn's disease. Underwent a laparoscopic ileocecectomy with end ileostomy.   Surgical specimen pathology was consistent with Crohn's disease (chronic enteritis with transmural inflammation and numerous noncaseating granulomas).  Did not receive steroids or any IBD directed therapies during her hospital stay.  -February 2022: Follow-up CT scan showed no new intra-abdominal collections.  Some thickening noted in the rectum and hepatic flexure.   - May 2022: Underwent laparoscopic take down of ostomy site but given concern for health of R colon, underwent completion right hemicolectomy with hepatic flexure mobilization, side-to-side functional end-to-end ileocolic anastomosis with Dr. Neysa Hotter.  - June 2022 start of adalimumab    Last endoscopy:     Colonoscopy 01/2022     Patent end-to-end ileo-colonic anastomosis,                          characterized by ulceration, Rutgeert's i2a.                         -  Simple Endoscopic Score for Crohn's Disease: 0,                          Crohn's disease, in remission.                         - Pseudopolyps in the sigmoid colon.                         - No specimens collected.                         No significant recurence of Crohn's disease. Continue                          Humira.      Colonoscopy 12/31/2020  Impression:            - The entire examined colon is normal.                         - Stricture in the terminal ileum. Not traversed.                         - No specimens collected.    Last imaging:      CT A/P 01/24/2021 (post-op outpatient follow-up)    -Diffuse wall thickening of the colon extending to the rectum with mild stranding and edema predominantly about the hepatic flexure concerning for a component of active inflammatory disease.  -Interval decreased size of left lower quadrant peripherally enhancing collection/abscess.  -Redemonstrated additional peripheral enhancing cystic foci in the adnexa which may represent additional foci of collections versus ovarian follicle/hydrosalpinx.    CT A/P 01/12/2021 (post op, done due to leucocytosis and tachycardia)    --Interval ileocecectomy with right lower quadrant end ileostomy. Wall thickening/inflammatory change of the distal ileum at the ileostomy as well as of the colon at the hepatic flexure, new. Inflammatory changes of the rectosigmoid colon, improved since prior.     --Ill-defined defined hyperdense fluid in the right paracolic gutter, equivocal for hematoma versus small amount of extravasated enteric contrast, though no sites of bowel leak is identified. Trace hyperdense fluid versus peritoneal enhancement is seen extending anteriorly. If there are clinical concerns of bowel leak, fistulogram through the ileocecal ostomy could be performed.     --Peripherally enhancing fluid collection concerning for abscess in the deep posterior pelvis is decreased in size, now 4.3 x 1.9 cm. Interval removal of pelvic drains. Tract extends from this fluid collection towards the sigmoid colon, with fistula not excluded. Tract corresponding to site of prior drain placement also extends from this through the left posterior gluteal soft tissues.     --Additional fluid density structure in the left hemipelvis is mildly increased from prior. This could represent a left adnexal/ovarian cyst versus hydrosalpinx or residual fluid/phlegmon from previously drained fluid collection with interval catheter removal.     --Wall thickening/inflammatory change involving distal ileum at the ileostomy, hepatic flexure, and rectosigmoid colon, likely chronic inflammation.     --Small locules of air within the findings favor seroma, though tiny developing superficial abscess not excluded. Correlate with exam findings.       CT A/P 12/29/2019 (at the time of admission)  -Left transgluteal intra-abdominal drain with tip terminating in the left lower  pelvis. Small volume free fluid along the inferior aspect of the drain tip. Interval development of new rim-enhancing fluid collection in the posterior lower pelvis measuring 3.8 x 2.9 cm, concerning for new intraabdominal abscess and likely amenable to drainage.     -Long segmental wall thickening of the terminal ileum and sigmoid colon, compatible with known Crohn's.        Surgical specimen pathology 01/05/2021    A: Small bowel cecum, ileocecectomy  - Segment of terminal ileum and proximal colon with patchy severely active chronic enteritis with fissuring ulceration, perforation, and stricture, transmural inflammation, numerous noncaseating granulomas, and acute and chronic serositis with adhesions, consistent with clinical history of Crohn's disease  - No CMV viral cytopathic effect, granuloma, or dysplasia identified  - Margins appear histologically viable and negative for dysplasia or malignancy with proximal margin demonstrating rare noncaseating granuloma  - Appendix with dense adhesions and acute and chronic serositis, secondary to separate associated fissuring ulceration  - Two lymph nodes, negative for malignancy, with rare noncaseating granuloma (0/2)  - Special stains for micro-organisms will be reported as an addendum    IBD health maintenance:  Influenza vaccine: No  Covid vaccine: No  Pneumonia vaccine: No  Hepatitis B:   TB testing: Yes- 12/29/2020  Chickenpox/Shingles history: Has not had chickenpox. Has not had Shingles vaccines.  Bone denistometry: -  PAP smear: -    Past Medical History:   Past medical history:   Crohn's, 2021    Past surgical history:   Laparoscopic ileocecectomy, end ileostomy creation, 01/05/2021  Fracture tibia repair, 2014    Family Has not had chickenpox. Has not had Shingles vaccines.  Bone denistometry: -  PAP smear: -    Past Medical History:   Past medical history:   Crohn's, 2021    Past surgical history:   Laparoscopic ileocecectomy, end ileostomy creation, 01/05/2021  Fracture tibia repair, 2014    Family history:   No colon cancer or IBD.    Social history:   Does not smoke.  Occasional alcohol.  Marijuana occasional. Last use was Sept.  Originally lived in [place]. Moved to [place] with her sister.   Lives in [place]. Lives with her sister.  Working intermittently at [employer] - about to start working more consistently.    Allergies:   No Known Allergies      Medications:     Current Outpatient Medications   Medication Sig Dispense Refill    colestipoL (COLESTID) 1 gram tablet Take 2 tablets in the morning. If necessary 1-2 tablets  approx. 10 minutes  before large meals. Take other medications 2-3 hours apart. 180 tablet 11    HUMIRA PEN CITRATE FREE 40 MG/0.4 ML Inject the contents of 1 pen (40 mg total) under the skin every fourteen (14) days. 6 each 1    simethicone (MYLICON) 125 MG chewable tablet Take 2 tablets (250 mg) at 5:00 pm the day before your procedure. Take remaining 2 tablets (250 mg) at least 4 hours before your procedure time. 4 tablet 0     No current facility-administered medications for this visit.               Physical Exam:   BP 103/76  - Pulse 88  - Temp 36.7 ??C (98 ??F)  - Ht 165.1 cm (5' 5)  - Wt 59 kg (130 lb)  - BMI 21.63 kg/m??      BP Readings from Last 3 Encounters:   06/29/22 103/76  02/06/22 113/78   09/01/21 113/75      Wt Readings from Last 3 Encounters:   06/29/22 59 kg (130 lb)   02/06/22 59.9 kg (132 lb)   09/01/21 62.7 kg (138 lb 3.2 oz)      BMI: Estimated body mass Henry is 21.63 kg/m?? as calculated from the following:    Height as of this encounter: 165.1 cm (5' 5).    Weight as of this encounter: 59 kg (130 lb).    BSA: Estimated body surface area is 1.64 meters squared as calculated from the following:    Height as of this encounter: 165.1 cm (5' 5).    Weight as of this encounter: 59 kg (130 lb).    GEN: no apparent distress, appears comfortable on exam  HEENT: PEERL, OP clear with no erythema/lesions/exudate, mucous membranes moist  NECK: Supple, no lymphadenopathy  LUNGS: CTAB, no wheezes, rales, or rhonchi  CV: S1/S2, RRR, no murmurs  ABD: Soft,non-tender, non-distended. Laparoscopic surgical incision sites c/d/i  Extremities: no cyanosis, clubbing or edema, normal gait  Psych: affect appropriate, A&O x3  SKIN: no visible lesions on face, neck, arms, abdomen      Labs    No visits with results within 1 Month(s) from this visit.   Latest known visit with results is:   Admission on 02/03/2022, Discharged on 02/06/2022   Component Date Value Ref Range Status    Ferritin 02/06/2022 39.1  7.3 - 270.7 ng/mL Final    WBC 02/06/2022 5.3  3.6 - 11.2 10*9/L Final    RBC 02/06/2022 4.98  3.95 - 5.13 10*12/L Final    HGB 02/06/2022 10.7 (L)  11.3 - 14.9 g/dL Final    HCT 09/81/1914 35.0  34.0 - 44.0 % Final    MCV 02/06/2022 70.3 (L)  77.6 - 95.7 fL Final    MCH 02/06/2022 21.5 (L)  25.9 - 32.4 pg Final    MCHC 02/06/2022 30.7 (L)  32.0 - 36.0 g/dL Final    RDW 78/29/5621 14.0  12.2 - 15.2 % Final    MPV 02/06/2022 8.3  6.8 - 10.7 fL Final    Platelet 02/06/2022 356  150 - 450 10*9/L Final    Sodium 02/06/2022 140  135 - 145 mmol/L Final    Potassium 02/06/2022 3.8  3.4 - 4.8 mmol/L Final    Chloride 02/06/2022 106  98 - 107 mmol/L Final    CO2 02/06/2022 26.0  20.0 - 31.0 mmol/L Final    Anion Gap 02/06/2022 8  5 - 14 mmol/L Final    BUN 02/06/2022 <5 (L)  9 - 23 mg/dL Final    Creatinine 30/86/5784 0.78  0.60 - 0.80 mg/dL Final    eGFR CKD-EPI (2021) Female 02/06/2022 >90  >=60 mL/min/1.10m2 Final    Glucose 02/06/2022 82  70 - 99 mg/dL Final    Calcium 69/62/9528 9.3  8.7 - 10.4 mg/dL Final    Albumin 41/32/4401 3.8  3.4 - 5.0 g/dL Final    Total Protein 02/06/2022 7.1  5.7 - 8.2 g/dL Final    Total fL Final   ??? MCH 06/29/2022 22.0 (L)  25.9 - 32.4 pg Final   ??? MCHC 06/29/2022 30.8 (L)  32.0 - 36.0 g/dL Final   ??? RDW 02/72/5366 14.1  12.2 - 15.2 % Final   ??? MPV 06/29/2022 7.7  6.8 - 10.7 fL Final   ??? Platelet 06/29/2022 425  150 - 450 10*9/L Final   ??? Neutrophils % 06/29/2022 58.9  %  Final   ??? Lymphocytes % 06/29/2022 32.9  % Final   ??? Monocytes % 06/29/2022 5.9  % Final   ??? Eosinophils % 06/29/2022 1.5  % Final   ??? Basophils % 06/29/2022 0.8  % Final   ??? Absolute Neutrophils 06/29/2022 3.2  1.8 - 7.8 10*9/L Final   ??? Absolute Lymphocytes 06/29/2022 1.8  1.1 - 3.6 10*9/L Final   ??? Absolute Monocytes 06/29/2022 0.3  0.3 - 0.8 10*9/L Final   ??? Absolute Eosinophils 06/29/2022 0.1  0.0 - 0.5 10*9/L Final   ??? Absolute Basophils 06/29/2022 0.0  0.0 - 0.1 10*9/L Final   ??? Microcytosis 06/29/2022 Slight (A)  Not Present Final       Disease severity Henry:     Harvey-Bradshaw Henry for Crohn's Disease    1. General well-being: Very well = 0    2. Abdominal pain:  None = 0    3. Number of liquid or soft stools per day: 2-3    4. Abdominal mass: None = 0    5. Complications: None    Harvey-Bradshaw Henry score:  Remission <5    Assessment     Assessment & Plan:     Erica Henry is a 21 y.o. female who comes to clinic today for follow-up care of penetrating ileal Crohn's disease, characterized by a terminal ileal stricture and intra-abdominal abscesses.  She has undergone a few intra-abdominal abscess drainages (fall 2021-Jan 2022) and recently underwent a laparoscopic ileocecectomy with end ileostomy creation on 01/05/2021 at [facility] followed by a laparoscopic take down of ostomy site and completion right hemicolectomy with side-to-side functional end-to-end ileocolic anastomosis on 05/03/2021.       Penetrating ileal Crohn's disease:   Patient started Humira in June 2022 and is currently doing well.  Colonoscopy postoperatively in 01/2022 showed an Rutgeerts I 2a recurrence which is not predictive of clinical recurrence.  We will evaluate if this is affordable for her, otherwise I recommended loperamide symptomatically.      Labs: Normalization of hemoglobin with microcytic indices, might be minor thalassemia since her ferritin was high with 100 in June 2022      Health maintenance: Patient declined COVID-19 vaccine. Patient is interested in receiving the pneumonia and shingles vaccines once she has health insurance. Vitamin B12 normal in 01/2021.    Plan        There are no Patient Instructions on file for this visit.

## 2022-06-30 ENCOUNTER — Other Ambulatory Visit (HOSPITAL_BASED_OUTPATIENT_CLINIC_OR_DEPARTMENT_OTHER): Payer: Self-pay

## 2022-07-04 LAB — VITAMIN D 25 HYDROXY: VITAMIN D, TOTAL (25OH): 7.5 ng/mL — ABNORMAL LOW (ref 20.0–80.0)

## 2022-07-04 NOTE — Unmapped (Signed)
Erica Henry 's HUMIRA(CF) PEN 40 mg/0.4 mL injection (adalimumab) shipment will be delayed as a result of the patient's insurance plan being in grace period (patient needs to pay insurance premium).     I have reached out to the patient  at [phone] and communicated the delay. We will call the patient back to reschedule the delivery upon resolution. We have not confirmed the new delivery date.

## 2022-07-05 ENCOUNTER — Other Ambulatory Visit (HOSPITAL_BASED_OUTPATIENT_CLINIC_OR_DEPARTMENT_OTHER): Payer: Self-pay

## 2022-07-05 MED ORDER — CHOLECALCIFEROL (VITAMIN D3) 1,250 MCG (50,000 UNIT) CAPSULE
ORAL_CAPSULE | ORAL | 0 refills | 364 days | Status: CP
Start: 2022-07-05 — End: 2023-06-28

## 2022-07-05 MED ORDER — D3-50 1.25 MG (50000 UT) PO CAPS
ORAL_CAPSULE | ORAL | 0 refills | Status: AC
  Filled 2022-07-05: qty 12, 84d supply, fill #0

## 2022-07-10 ENCOUNTER — Other Ambulatory Visit (HOSPITAL_BASED_OUTPATIENT_CLINIC_OR_DEPARTMENT_OTHER): Payer: Self-pay

## 2022-07-10 NOTE — Unmapped (Signed)
Erica Henry 's Humira shipment will be sent out  as a result of the patient's insurance plan being in grace period (patient needs to pay insurance premium).     Patient called back and communicated the delivery change. We will reschedule the medication for the delivery date that the patient agreed upon.  We have confirmed the delivery date as 07/25, via ups.

## 2022-07-17 ENCOUNTER — Other Ambulatory Visit (HOSPITAL_BASED_OUTPATIENT_CLINIC_OR_DEPARTMENT_OTHER): Payer: Self-pay

## 2022-07-18 ENCOUNTER — Other Ambulatory Visit (HOSPITAL_BASED_OUTPATIENT_CLINIC_OR_DEPARTMENT_OTHER): Payer: Self-pay

## 2022-07-27 NOTE — Unmapped (Signed)
[facility] Clinical Assessment & Refill Coordination Note    Erica Henry, DOB: Dec 25, 2000  Phone: There are no phone numbers on file.    All above HIPAA information was verified with patient.     Was a Nurse, learning disability used for this call? No    Specialty Medication(s):   Inflammatory Disorders: Humira     Current Outpatient Medications   Medication Sig Dispense Refill    cholecalciferol, vitamin D3-1,250 mcg, 50,000 unit,, 1,250 mcg (50,000 unit) capsule Take 1 capsule (1,250 mcg total) by mouth once a week for 52 doses. 52 capsule 0    clobetasoL (TEMOVATE) 0.05 % ointment Apply topically Two (2) times a day. 30 g 0    colestipoL (COLESTID) 1 gram tablet Take 2 tablets (2 g total) by mouth Two (2) times a day. Take 2 tablets in the morning. If necessary 1-2 tablets  approx. 10 minutes  before large meals. Take other medications 2-3 hours apart. 360 tablet 3    HUMIRA PEN CITRATE FREE 40 MG/0.4 ML Inject the contents of 1 pen (40 mg total) under the skin every fourteen (14) days. 6 each 1    simethicone (MYLICON) 125 MG chewable tablet Take 2 tablets (250 mg) at 5:00 pm the day before your procedure. Take remaining 2 tablets (250 mg) at least 4 hours before your procedure time. 4 tablet 0     No current facility-administered medications for this visit.        Changes to medications: [name] reports no changes at this time.    No Known Allergies    Changes to allergies: No    SPECIALTY MEDICATION ADHERENCE     Humira 40  mg/0.38mL : 1-15 days of medicine on hand     Medication Adherence    Patient reported X missed doses in the last month: 0  Specialty Medication: Humira 40 mg/0.26mL Q14d  Patient is on additional specialty medications: No  Patient is on more than two specialty medications: No  Any gaps in refill history greater than 2 weeks in the last 3 months: no  Demonstrates understanding of importance of adherence: yes  Informant: patient                            Specialty medication(s) dose(s) confirmed: Regimen is correct and unchanged.     Are there any concerns with adherence? No    Adherence counseling provided? Not needed    CLINICAL MANAGEMENT AND INTERVENTION      Clinical Benefit Assessment:    Do you feel the medicine is effective or helping your condition? Yes    Clinical Benefit counseling provided? Progress note from 7/13 shows evidence of clinical benefit    Adverse Effects Assessment:    Are you experiencing any side effects? No    Are you experiencing difficulty administering your medicine? No    Quality of Life Assessment:    Quality of Life    Rheumatology  Oncology  Dermatology  Cystic Fibrosis          How many days over the past month did your crohn's disease  keep you from your normal activities? For example, brushing your teeth or getting up in the morning. 0 - [name] denied experiencing any symptoms related to crohn's at this time    Have you discussed this with your provider? Not needed    Acute Infection Status:    Acute infections noted  within Epic:  No active infections  Patient reported infection: None    Therapy Appropriateness:    Is therapy appropriate and patient progressing towards therapeutic goals? Yes, therapy is appropriate and should be continued    DISEASE/MEDICATION-SPECIFIC INFORMATION      For patients on injectable medications: Patient currently has 1-2 doses left.  Next injection is scheduled for 8/11.    PATIENT SPECIFIC NEEDS     Does the patient have any physical, cognitive, or cultural barriers? No    Is the patient high risk? No    Does the patient require a Care Management Plan? No     SOCIAL DETERMINANTS OF HEALTH     At the [facility], we have learned that life circumstances - like trouble affording food, housing, utilities, or transportation can affect the health of many of our patients.   That is why we wanted to ask: are you currently experiencing any life circumstances that are negatively impacting your health and/or quality of life? No    Social Determinants of Psychologist, prison and probation services Strain: Not on file   Internet Connectivity: Not on file   Food Insecurity: Not on file   Tobacco Use: Low Risk  (06/29/2022)    Patient History     Smoking Tobacco Use: Never     Smokeless Tobacco Use: Never     Passive Exposure: Not on file   Housing/Utilities: Not on file   Alcohol Use: Not on file   Transportation Needs: Not on file   Substance Use: Not on file   Health Literacy: Not on file   Physical Activity: Not on file   Interpersonal Safety: Not on file   Stress: Not on file   Intimate Partner Violence: Not on file   Depression: Not on file   Social Connections: Not on file       Would you be willing to receive help with any of the needs that you have identified today? Not applicable       SHIPPING     Specialty Medication(s) to be Shipped:   Inflammatory Disorders: Humira    Other medication(s) to be shipped: No additional medications requested for fill at this time     Changes to insurance: No    Delivery Scheduled: Yes, Expected medication delivery date: 08/04/22.     Medication will be delivered via UPS to the confirmed prescription address in [facility].    The patient will receive a drug information handout for each medication shipped and additional FDA Medication Guides as required.  Verified that patient has previously received a Conservation officer, historic buildings and a Surveyor, mining.    The patient or caregiver noted above participated in the development of this care plan and knows that they can request review of or adjustments to the care plan at any time.      All of the patient's questions and concerns have been addressed.    Oliva Bustard   [facility] Specialty Pharmacist

## 2022-08-03 MED FILL — HUMIRA PEN CITRATE FREE 40 MG/0.4 ML: SUBCUTANEOUS | 28 days supply | Qty: 2 | Fill #1

## 2022-08-04 ENCOUNTER — Other Ambulatory Visit (HOSPITAL_BASED_OUTPATIENT_CLINIC_OR_DEPARTMENT_OTHER): Payer: Self-pay

## 2022-08-04 MED ORDER — CHOLESTYRAMINE-ASPARTAME 4 GRAM ORAL POWDER FOR SUSP IN A PACKET
PACK | Freq: Two times a day (BID) | ORAL | 3 refills | 30 days | Status: CP
Start: 2022-08-04 — End: 2022-09-03

## 2022-08-04 MED ORDER — CHOLESTYRAMINE LIGHT 4 G PO PACK
PACK | ORAL | 3 refills | Status: AC
  Filled 2022-08-04: qty 60, 30d supply, fill #0

## 2022-08-07 ENCOUNTER — Other Ambulatory Visit (HOSPITAL_BASED_OUTPATIENT_CLINIC_OR_DEPARTMENT_OTHER): Payer: Self-pay

## 2022-08-29 ENCOUNTER — Other Ambulatory Visit (HOSPITAL_BASED_OUTPATIENT_CLINIC_OR_DEPARTMENT_OTHER): Payer: Self-pay

## 2022-08-29 MED ORDER — PREDNISONE 10 MG PO TABS
ORAL_TABLET | ORAL | 0 refills | Status: AC
  Filled 2022-08-29: qty 21, 6d supply, fill #0

## 2022-08-29 MED ORDER — CETIRIZINE HCL 10 MG PO TABS
10.0000 mg | ORAL_TABLET | Freq: Every day | ORAL | 0 refills | Status: DC
  Filled 2022-08-29: qty 30, 30d supply, fill #0

## 2022-08-29 NOTE — Unmapped (Signed)
[facility] Refill Coordination Note    Specialty Medication(s) to be Shipped:   Inflammatory Disorders: Humira    Other medication(s) to be shipped: No additional medications requested for fill at this time     Erica Henry, DOB: 20-Aug-2001  Phone: There are no phone numbers on file.      All above HIPAA information was verified with patient.     Was a Nurse, learning disability used for this call? No    Completed refill call assessment today to schedule patient's medication shipment from the [facility] [phone]).  All relevant notes have been reviewed.     Specialty medication(s) and dose(s) confirmed: Regimen is correct and unchanged.   Changes to medications: Erica Henry reports no changes at this time.  Changes to insurance: No  New side effects reported not previously addressed with a pharmacist or physician: None reported  Questions for the pharmacist: No    Confirmed patient received a Conservation officer, historic buildings and a Surveyor, mining with first shipment. The patient will receive a drug information handout for each medication shipped and additional FDA Medication Guides as required.       DISEASE/MEDICATION-SPECIFIC INFORMATION        For patients on injectable medications: Patient currently has 1 doses left.  Next injection is scheduled for 09/02/22.    SPECIALTY MEDICATION ADHERENCE     Medication Adherence    Patient reported X missed doses in the last month: 0  Specialty Medication: HUMIRA(CF) PEN 40 mg/0.4 mL injection  Patient is on additional specialty medications: No                          Were doses missed due to medication being on hold? No    Humira 40/0.4 mg/ml: 4 days of medicine on hand        REFERRAL TO PHARMACIST     Referral to the pharmacist: Not needed      [facility]     Shipping address confirmed in Epic.     Delivery Scheduled: Yes, Expected medication delivery date: 09/06/22.     Medication will be delivered via UPS to the prescription address in Epic [facility].    Erica Henry   [facility] Specialty Technician

## 2022-09-04 ENCOUNTER — Other Ambulatory Visit (HOSPITAL_BASED_OUTPATIENT_CLINIC_OR_DEPARTMENT_OTHER): Payer: Self-pay

## 2022-09-04 MED ORDER — ZOSTER VAC RECOMB ADJUVANTED 50 MCG/0.5ML IM SUSR
INTRAMUSCULAR | 1 refills | Status: AC
  Filled 2022-09-04: qty 1, 1d supply, fill #0

## 2022-09-05 MED FILL — HUMIRA PEN CITRATE FREE 40 MG/0.4 ML: SUBCUTANEOUS | 28 days supply | Qty: 2 | Fill #2

## 2022-10-02 NOTE — Unmapped (Signed)
[facility] Refill Coordination Note    Specialty Medication(s) to be Shipped:   Inflammatory Disorders: Humira    Other medication(s) to be shipped: No additional medications requested for fill at this time     Erica Henry, DOB: 2001-12-16  Phone: There are no phone numbers on file.      All above HIPAA information was verified with patient.     Was a Nurse, learning disability used for this call? No    Completed refill call assessment today to schedule patient's medication shipment from the [facility] [phone]).  All relevant notes have been reviewed.     Specialty medication(s) and dose(s) confirmed: Regimen is correct and unchanged.   Changes to medications: Leotta reports no changes at this time.  Changes to insurance: No  New side effects reported not previously addressed with a pharmacist or physician: None reported  Questions for the pharmacist: No    Confirmed patient received a Conservation officer, historic buildings and a Surveyor, mining with first shipment. The patient will receive a drug information handout for each medication shipped and additional FDA Medication Guides as required.       DISEASE/MEDICATION-SPECIFIC INFORMATION        For patients on injectable medications: Patient currently has 0 doses left.  Next injection is scheduled for 10/06/22.    SPECIALTY MEDICATION ADHERENCE     Medication Adherence    Patient reported X missed doses in the last month: 0  Specialty Medication: HUMIRA(CF) PEN 40 mg/0.4 mL injection  Patient is on additional specialty medications: No  Informant: patient                          Were doses missed due to medication being on hold? No    Humira 40 mg/0.29ml: 0 days of medicine on hand       REFERRAL TO PHARMACIST     Referral to the pharmacist: Not needed      [facility]     Shipping address confirmed in Epic.     Delivery Scheduled: Yes, Expected medication delivery date: 10/04/22.     Medication will be delivered via UPS to the prescription address in Epic WAM.    Jasper Loser   [facility] Specialty Technician

## 2022-10-03 MED FILL — HUMIRA PEN CITRATE FREE 40 MG/0.4 ML: SUBCUTANEOUS | 28 days supply | Qty: 2 | Fill #3

## 2022-10-27 NOTE — Unmapped (Signed)
[facility] Refill Coordination Note    Specialty Medication(s) to be Shipped:   Inflammatory Disorders: Humira    Other medication(s) to be shipped: No additional medications requested for fill at this time     Erica Henry, DOB: [birth year]  Phone: There are no phone numbers on file.      All above HIPAA information was verified with patient.     Was a Nurse, learning disability used for this call? No    Completed refill call assessment today to schedule patient's medication shipment from the [facility] [phone]).  All relevant notes have been reviewed.     Specialty medication(s) and dose(s) confirmed: Regimen is correct and unchanged.   Changes to medications: Zujey reports no changes at this time.  Changes to insurance: No  New side effects reported not previously addressed with a pharmacist or physician: None reported  Questions for the pharmacist: No    Confirmed patient received a Conservation officer, historic buildings and a Surveyor, mining with first shipment. The patient will receive a drug information handout for each medication shipped and additional FDA Medication Guides as required.       DISEASE/MEDICATION-SPECIFIC INFORMATION        For patients on injectable medications: Patient currently has 1 doses left.  Next injection is scheduled for 11/17.    SPECIALTY MEDICATION ADHERENCE     Medication Adherence    Patient reported X missed doses in the last month: 0  Specialty Medication: HUMIRA(CF) PEN 40 mg/0.4 mL injection  Patient is on additional specialty medications: No                          Were doses missed due to medication being on hold? No    Humira 40/0.4 mg/ml: 7 days of medicine on hand        REFERRAL TO PHARMACIST     Referral to the pharmacist: Not needed      [facility]     Shipping address confirmed in Epic.     Delivery Scheduled: Yes, Expected medication delivery date: 11/01/22.     Medication will be delivered via UPS to the prescription address in Epic WAM.    [name]   [facility] Specialty Technician

## 2022-10-31 NOTE — Unmapped (Signed)
Watt Climes Riebel 's HUMIRA(CF) PEN 40 mg/0.4 mL injection (adalimumab) shipment will be delayed as a result of a high copay.     I have reached out to the patient  at [phone] and left a voicemail message.  We will call the patient back to reschedule the delivery upon resolution. We have not confirmed the new delivery date.

## 2022-11-07 NOTE — Unmapped (Signed)
[facility] Refill Coordination Note    Specialty Medication(s) to be Shipped:   Inflammatory Disorders: Humira    Other medication(s) to be shipped: No additional medications requested for fill at this time     Erica Henry, DOB: 02/02/2001  Phone: There are no phone numbers on file.      All above HIPAA information was verified with patient.     Was a Nurse, learning disability used for this call? No    Completed refill call assessment today to schedule patient's medication shipment from the [facility] [phone]).  All relevant notes have been reviewed.     Specialty medication(s) and dose(s) confirmed: Regimen is correct and unchanged.   Changes to medications: Erica Henry reports no changes at this time.  Changes to insurance: No  New side effects reported not previously addressed with a pharmacist or physician: None reported  Questions for the pharmacist: No    Confirmed patient received a Conservation officer, historic buildings and a Surveyor, mining with first shipment. The patient will receive a drug information handout for each medication shipped and additional FDA Medication Guides as required.       DISEASE/MEDICATION-SPECIFIC INFORMATION        For patients on injectable medications: Patient currently has 1 doses left.  Next injection is scheduled for 11/14/22.    SPECIALTY MEDICATION ADHERENCE     Medication Adherence    Patient reported X missed doses in the last month: 0  Specialty Medication: HUMIRA(CF) PEN 40 mg/0.4 mL injection (adalimumab)  Patient is on additional specialty medications: No                                Were doses missed due to medication being on hold? No    REFERRAL TO PHARMACIST     Referral to the pharmacist: Not needed      [facility]     Shipping address confirmed in Epic.     Delivery Scheduled: Yes, Expected medication delivery date: 11/15/22.     Medication will be delivered via UPS to the prescription address in Epic WAM.    Quintella Reichert   [facility] Specialty Technician

## 2022-11-07 NOTE — Unmapped (Signed)
Erica Henry 's HUMIRA(CF) PEN 40 mg/0.4 mL injection (adalimumab) shipment will be canceled  as a result of a high copay.     I have reached out to the patient  at [phone] and communicated the delivery change. We will not reschedule the medication and have removed this/these medication(s) from the work request.  We have canceled this work request.     Note: 3 failed attempts (11/14, 11/15, 11/17, 11/20) pt was made aware 11/15 she needs more funding. Pt was supposed to call back with an update, have not heard from pt.

## 2022-11-14 MED FILL — HUMIRA PEN CITRATE FREE 40 MG/0.4 ML: SUBCUTANEOUS | 28 days supply | Qty: 2 | Fill #4

## 2022-11-17 ENCOUNTER — Other Ambulatory Visit (HOSPITAL_BASED_OUTPATIENT_CLINIC_OR_DEPARTMENT_OTHER): Payer: Self-pay

## 2022-11-17 MED ORDER — NITROFURANTOIN MONOHYD MACRO 100 MG PO CAPS
100.0000 mg | ORAL_CAPSULE | Freq: Two times a day (BID) | ORAL | 0 refills | Status: AC
  Filled 2022-11-17: qty 14, 7d supply, fill #0

## 2022-11-20 ENCOUNTER — Other Ambulatory Visit (HOSPITAL_BASED_OUTPATIENT_CLINIC_OR_DEPARTMENT_OTHER): Payer: Self-pay

## 2022-11-20 MED ORDER — MOMETASONE FUROATE 0.1 % EX CREA
1.0000 | TOPICAL_CREAM | Freq: Two times a day (BID) | CUTANEOUS | 1 refills | Status: AC
  Filled 2022-11-20: qty 45, 60d supply, fill #0

## 2022-11-20 MED ORDER — CETIRIZINE HCL 10 MG PO TABS
10.0000 mg | ORAL_TABLET | Freq: Every day | ORAL | 3 refills | Status: AC
  Filled 2022-11-20: qty 100, 100d supply, fill #0

## 2022-12-14 NOTE — Unmapped (Signed)
[facility] Refill Coordination Note    Specialty Medication(s) to be Shipped:   Inflammatory Disorders: Humira    Other medication(s) to be shipped: No additional medications requested for fill at this time     Erica Henry, DOB: 02-20-2001  Phone: There are no phone numbers on file.      All above HIPAA information was verified with patient.     Was a Nurse, learning disability used for this call? No    Completed refill call assessment today to schedule patient's medication shipment from the [facility] [phone]).  All relevant notes have been reviewed.     Specialty medication(s) and dose(s) confirmed: Regimen is correct and unchanged.   Changes to medications: Erica Henry reports no changes at this time.  Changes to insurance: No  New side effects reported not previously addressed with a pharmacist or physician: None reported  Questions for the pharmacist: No    Confirmed patient received a Conservation officer, historic buildings and a Surveyor, mining with first shipment. The patient will receive a drug information handout for each medication shipped and additional FDA Medication Guides as required.       DISEASE/MEDICATION-SPECIFIC INFORMATION        For patients on injectable medications: Patient currently has 1 doses left.  Next injection is scheduled for 12/29.    SPECIALTY MEDICATION ADHERENCE     Medication Adherence    Patient reported X missed doses in the last month: 0  Specialty Medication: HUMIRA(CF) PEN 40 mg/0.4 mL  Patient is on additional specialty medications: No  Any gaps in refill history greater than 2 weeks in the last 3 months: no  Demonstrates understanding of importance of adherence: yes  Informant: patient  Reliability of informant: reliable              Confirmed plan for next specialty medication refill: delivery by pharmacy  Refills needed for supportive medications: not needed              Were doses missed due to medication being on hold? No     HUMIRA(CF) PEN 40 mg/0.4 mL injection (adalimumab): 14 days on hand    REFERRAL TO PHARMACIST     Referral to the pharmacist: Not needed      [facility]     Shipping address confirmed in Epic.     Delivery Scheduled: Yes, Expected medication delivery date: 1/4.     Medication will be delivered via UPS to the prescription address in Epic WAM.    Valere Dross   [facility] Specialty Technician

## 2022-12-20 NOTE — Unmapped (Signed)
Watt Climes Balz 's HUMIRA(CF) PEN 40 mg/0.4 mL injection (adalimumab) shipment will be canceled  as a result of the patient's insurance plan being in grace period (patient needs to pay insurance premium).     I have reached out to the patient  at [phone] and communicated the delivery change. We will not reschedule the medication and have removed this/these medication(s) from the work request.  We have canceled this work request.     Note: Patient stated she does not know when she will be able to pay the premium. Patient is aware her fill will be canceled at this time until she calls [place] back after she has paid her premium.

## 2022-12-21 MED FILL — HUMIRA PEN CITRATE FREE 40 MG/0.4 ML: SUBCUTANEOUS | 28 days supply | Qty: 2 | Fill #5

## 2022-12-21 NOTE — Unmapped (Signed)
Erica Henry 's Humira (CF) pen 40mg /0.22mL injection shipment will be sent out  as a result of copay is now approved by patient/caregiver.      I have reached out to the patient  at [phone] and communicated the delivery change. We will reschedule the medication for the delivery date that the patient agreed upon.  We have confirmed the delivery date as 12/22/2022, via ups.     A representative from [employer] called and gave a PA override code to allow for payment even though patient is in grace period with her insurance. Payment results in $0 copay.

## 2023-01-10 DIAGNOSIS — K50919 Crohn's disease, unspecified, with unspecified complications: Principal | ICD-10-CM

## 2023-01-10 MED ORDER — ADALIMUMAB PEN CITRATE FREE 40 MG/0.4 ML
SUBCUTANEOUS | 0 refills | 84 days | Status: CP
Start: 2023-01-10 — End: ?

## 2023-01-10 NOTE — Unmapped (Signed)
Humira refill authorized. Follow up appt needed with Dr. Gwenith Spitz, patient will be contacted by [facility] GI central scheduling to schedule

## 2023-02-13 NOTE — Unmapped (Signed)
The [facility] has made a second and final attempt to reach this patient to refill the following medication:Humira.      We have left voicemails on the following phone numbers: [phone] and have sent a text message to the following phone numbers: [phone] .    Dates contacted: 02/06/23 & 02/13/23  Last scheduled delivery: 12/22/22 (28 day supply)    The patient may be at risk of non-compliance with this medication. The patient should call the [facility] at [phone]  Option 4, then Option 2 (all other specialty patients) to refill medication.    [name]   [facility] Specialty Technician

## 2023-03-07 NOTE — Unmapped (Signed)
[facility]   INFLAMMATORY BOWEL DISEASE CLINIC  FOLLOW-UP CONSULT NOTE             Referring physician:   Referring, None Per Patient  [address] [place],  [place] [address]    Today, I saw Erica Henry for initial consultation in the [facility] regarding Crohn's Disease.          HPI: [name] is a 22 y.o.  African-American lady comes to clinic today to follow-up care for penetrating ileal Crohn's disease, characterized by a terminal ileal stricture and intra-abdominal abscesses.     Patient had lapse in insurance and is off Humira since [facility] January. Now back with insurance . 1-2 soft bowel movements daily. 2 imodium capsules daily. Stable weight, no extraintestinal manifestations of CD.     Erica Henry for Crohn's Disease  General Well Being: 0 = Very well  Abdominal Pain:  0 = none  Number of Liquid Stools Per day: 2  Abdominal Mass:  0 = None  Number of Extraintestinal Manifestations:  0   1 point each for: 1. Arthritis/arthralgias, 2. Iritis/uveitis, 3. Active perianal dz, 4. Other active fistula, 5. Erythema nodosum or pyoderma, 6. Other    Total HBI Score (0-25):  2     Score:  Remission < 5;  Mild dz 5-7;  Moderate dz 8-16;  Severe > 16      Review of Systems: Positive as above. Otherwise, the balance of all systems is negative.     IBD HISTORY:     Disease phenotype: Penetrating Crohn's disease    Year of disease onset:  2020      Brief IBD Disease Course:    2020/early 2021: Had non-localized abdominal pain. Work-up in [place]. Had a colonoscopy in Nov 2020 and was told that it was normal (no records).  Sept 2021: Moved to [place]. Abdominal pain got worse. Was found to have abdominal abscesses- drained and was started on antibiotics.   Jan 2022: Admitted to [facility] with intra-abdominal abscess seen on CT.  Drain placed.  Underwent a colonoscopy that showed TI stricturing that was not traversed.  Based on these findings, she was determined to have penetrating Crohn's disease. Underwent a laparoscopic ileocecectomy with end ileostomy.   Surgical specimen pathology was consistent with Crohn's disease (chronic enteritis with transmural inflammation and numerous noncaseating granulomas).  Did not receive steroids or any IBD directed therapies during her hospital stay.  February 2022: Follow-up CT scan showed no new intra-abdominal collections.  Some thickening noted in the rectum and hepatic flexure.   May 2022: Underwent laparoscopic take down of ostomy site but given concern for health of R colon, underwent completion right hemicolectomy with hepatic flexure mobilization, side-to-side functional end-to-end ileocolic anastomosis with Dr. Neysa Hotter.  June 2022 start of adalimumab  12/2022 stop of Humira by patient due to loss of insurance  03/2023 restart Humira      Last endoscopy:     Colonoscopy 01/2022     Patent end-to-end ileo-colonic anastomosis,                          characterized by ulceration, Rutgeert's i2a.                         - Simple Endoscopic Score for Crohn's Disease: 0,  Crohn's disease, in remission.                         - Pseudopolyps in the sigmoid colon.                         - No specimens collected.                         No significant recurrence of Crohn's disease. Continue                          Humira.      Colonoscopy 12/31/2020  Impression:            - The entire examined colon is normal.                         - Stricture in the terminal ileum. Not traversed.                         - No specimens collected.    Last imaging:      CT A/P 01/24/2021 (post-op outpatient follow-up)    -Diffuse wall thickening of the colon extending to the rectum with mild stranding and edema predominantly about the hepatic flexure concerning for a component of active inflammatory disease.  -Interval decreased size of left lower quadrant peripherally enhancing collection/abscess.  -Redemonstrated additional peripheral enhancing cystic foci in the adnexa which may represent additional foci of collections versus ovarian follicle/hydrosalpinx.    CT A/P 01/12/2021 (post op, done due to leucocytosis and tachycardia)    --Interval ileocecectomy with right lower quadrant end ileostomy. Wall thickening/inflammatory change of the distal ileum at the ileostomy as well as of the colon at the hepatic flexure, new. Inflammatory changes of the rectosigmoid colon, improved since prior.     --Ill-defined hyperdense fluid in the right paracolic gutter, equivocal for hematoma versus small amount of extravasated enteric contrast, though no sites of bowel leak is identified. Trace hyperdense fluid versus peritoneal enhancement is seen extending anteriorly. If there are clinical concerns of bowel leak, fistulogram through the ileocecal ostomy could be performed.     --Peripherally enhancing fluid collection concerning for abscess in the deep posterior pelvis is decreased in size, now 4.3 x 1.9 cm. Interval removal of pelvic drains. Tract extends from this fluid collection towards the sigmoid colon, with fistula not excluded. Tract corresponding to site of prior drain placement also extends from this through the left posterior gluteal soft tissues.     --Additional fluid density structure in the left hemipelvis is mildly increased from prior. This could represent a left adnexal/ovarian cyst versus hydrosalpinx or residual fluid/phlegmon from previously drained fluid collection with interval catheter removal.     --Wall thickening/inflammatory change involving distal ileum at the ileostomy, hepatic flexure, and rectosigmoid colon, likely chronic inflammation.     --Small locules of air within the urinary bladder. No bladder fistula seen. Correlate for evidence of recent instrumentation.     --Postsurgical changes of the ventral abdominal wall. Tiny partially-organized fluid and air-containing collection at the umbilicus likely corresponding to site of prior port/trocar placement; findings favor seroma, though tiny developing superficial abscess not excluded. Correlate with exam findings.       CT A/P 12/29/2019 (at the time of admission)  -Left transgluteal intra-abdominal drain  with tip terminating in the left lower pelvis. Small volume free fluid along the inferior aspect of the drain tip. Interval development of new rim-enhancing fluid collection in the posterior lower pelvis measuring 3.8 x 2.9 cm, concerning for new intraabdominal abscess and likely amenable to drainage.     -Long segmental wall thickening of the terminal ileum and sigmoid colon, compatible with known Crohn's.        Surgical specimen pathology 01/05/2021    A: Small bowel cecum, ileocecectomy  - Segment of terminal ileum and proximal colon with patchy severely active chronic enteritis with fissuring ulceration, perforation, and stricture, transmural inflammation, numerous noncaseating granulomas, and acute and chronic serositis with adhesions, consistent with clinical history of Crohn's disease  - No CMV viral cytopathic effect, granuloma, or dysplasia identified  - Margins appear histologically viable and negative for dysplasia or malignancy with proximal margin demonstrating rare noncaseating granuloma  - Appendix with dense adhesions and acute and chronic serositis, secondary to separate associated fissuring ulceration  - Two lymph nodes, negative for malignancy, with rare noncaseating granuloma (0/2)  - Special stains for micro-organisms will be reported as an addendum    IBD health maintenance:  Influenza vaccine: No  Covid vaccine: No  Pneumonia vaccine: No  Hepatitis B:   TB testing: Yes- 12/29/2020  Chickenpox/Shingles history: Has not had chickenpox. Has not had Shingles vaccines.  Bone densitometry: -  PAP smear: -    Past Medical History:   Past medical history:   Crohn's, 2021    Past surgical history:   Laparoscopic ileocecectomy, end ileostomy creation, 01/05/2021  Fracture tibia repair, 2014    Family history:   No colon cancer or IBD.    Social history:   Does not smoke.  Occasional alcohol.  Marijuana occasional. Last use was Sept.  Originally lived in [place]. Moved to [place] with her sister.   Lives in [place]. Lives with her sister.  Working intermittently at [employer] - about to start working more consistently.    Allergies:   No Known Allergies      Medications:     Current Outpatient Medications   Medication Sig Dispense Refill    cholecalciferol, vitamin D3-1,250 mcg, 50,000 unit,, 1,250 mcg (50,000 unit) capsule Take 1 capsule (1,250 mcg total) by mouth once a week for 52 doses. 52 capsule 0    cholestyramine-aspartame (CHOLESTYRAMINE LIGHT) 4 gram PwPk Take 1 packet by mouth Two (2) times a day. 60 packet 3    clobetasoL (TEMOVATE) 0.05 % ointment Apply topically Two (2) times a day. (Patient not taking: Reported on 03/08/2023) 30 g 0    colestipoL (COLESTID) 1 gram tablet Take 2 tablets (2 g total) by mouth Two (2) times a day. Take 2 tablets in the morning. If necessary 1-2 tablets  approx. 10 minutes  before large meals. Take other medications 2-3 hours apart. (Patient not taking: Reported on 03/08/2023) 360 tablet 3    HUMIRA PEN CITRATE FREE 40 MG/0.4 ML Inject the contents of 1 pen (40 mg total) under the skin every fourteen (14) days. 6 each 1    HUMIRA PEN CITRATE FREE STARTER PACK FOR CROHN'S/UC/HS 3 X 80 MG/0.8 ML 160 mg day 1, then 80 mg on day 15 then 40 mg every 2 weeks 1 kit 0    loperamide (IMODIUM) 2 mg capsule Take 2 capsules (4 mg total) by mouth two (2) times a day as needed for diarrhea. 180 capsule 6    simethicone (MYLICON)  125 MG chewable tablet Take 2 tablets (250 mg) at 5:00 pm the day before your procedure. Take remaining 2 tablets (250 mg) at least 4 hours before your procedure time. (Patient not taking: Reported on 03/08/2023) 4 tablet 0     No current facility-administered medications for this visit.               Physical Exam:   BP 108/72  - Pulse 111  - Temp 36.3 ??C (97.4 ??F) (Temporal)  - Ht 165.1 cm (5' 5)  - Wt 56.3 kg (124 lb 3.2 oz)  - BMI 20.67 kg/m??      BP Readings from Last 3 Encounters:   03/08/23 108/72   06/29/22 103/76   02/06/22 113/78      Wt Readings from Last 3 Encounters:   03/08/23 56.3 kg (124 lb 3.2 oz)   06/29/22 59 kg (130 lb)   02/06/22 59.9 kg (132 lb)      BMI: Estimated body mass Henry is 20.67 kg/m?? as calculated from the following:    Height as of this encounter: 165.1 cm (5' 5).    Weight as of this encounter: 56.3 kg (124 lb 3.2 oz).    BSA: Estimated body surface area is 1.61 meters squared as calculated from the following:    Height as of this encounter: 165.1 cm (5' 5).    Weight as of this encounter: 56.3 kg (124 lb 3.2 oz).    GEN: no apparent distress, appears comfortable on exam  HEENT: PEERL, OP clear with no erythema/lesions/exudate, mucous membranes moist  NECK: Supple, no lymphadenopathy  LUNGS: CTAB, no wheezes, rales, or rhonchi  CV: S1/S2, RRR, no murmurs  ABD: Soft,non-tender, non-distended. Laparoscopic surgical incision sites c/d/i  Extremities: no cyanosis, clubbing or edema, normal gait  Psych: affect appropriate, A&O x3  SKIN: no visible lesions on face, neck, arms, abdomen      Labs    Office Visit on 03/08/2023   Component Date Value Ref Range Status    Sodium 03/08/2023 139  135 - 145 mmol/L Final    Potassium 03/08/2023 3.6  3.4 - 4.8 mmol/L Final    Chloride 03/08/2023 105  98 - 107 mmol/L Final    CO2 03/08/2023 28.0  20.0 - 31.0 mmol/L Final    Anion Gap 03/08/2023 6  5 - 14 mmol/L Final    BUN 03/08/2023 9  9 - 23 mg/dL Final    Creatinine 95/62/1308 0.84  0.55 - 1.02 mg/dL Final    BUN/Creatinine Ratio 03/08/2023 11   Final    eGFR CKD-EPI (2021) Female 03/08/2023 >90  >=60 mL/min/1.81m2 Final    Glucose 03/08/2023 89  70 - 179 mg/dL Final    Calcium 65/78/4696 9.8  8.7 - 10.4 mg/dL Final    Albumin 29/52/8413 4.6  3.4 - 5.0 g/dL Final    Total Protein 03/08/2023 8.8 (H)  5.7 - 8.2 g/dL Final    Total Bilirubin 03/08/2023 1.6 (H)  0.3 - 1.2 mg/dL Final    AST 24/40/1027 17  <=34 U/L Final    ALT 03/08/2023 8 (L)  10 - 49 U/L Final    Alkaline Phosphatase 03/08/2023 63  46 - 116 U/L Final    CRP 03/08/2023 <4.0  <=10.0 mg/L Final    Ferritin 03/08/2023 32.7  7.3 - 270.7 ng/mL Final    Iron 03/08/2023 64  50 - 170 ug/dL Final    TIBC 25/36/6440 299  250 - 425 ug/dL Final  Iron Saturation (%) 03/08/2023 21  20 - 55 % Final    Vitamin B-12 03/08/2023 367  211 - 911 pg/ml Final    WBC 03/08/2023 7.2  3.6 - 11.2 10*9/L Final    RBC 03/08/2023 5.28 (H)  3.95 - 5.13 10*12/L Final    HGB 03/08/2023 11.9  11.3 - 14.9 g/dL Final    HCT 16/09/9603 37.8  34.0 - 44.0 % Final    MCV 03/08/2023 71.6 (L)  77.6 - 95.7 fL Final    MCH 03/08/2023 22.4 (L)  25.9 - 32.4 pg Final    MCHC 03/08/2023 31.3 (L)  32.0 - 36.0 g/dL Final    RDW 54/08/8118 14.2  12.2 - 15.2 % Final    MPV 03/08/2023 8.2  6.8 - 10.7 fL Final    Platelet 03/08/2023 385  150 - 450 10*9/L Final    Neutrophils % 03/08/2023 55.4  % Final    Lymphocytes % 03/08/2023 36.3  % Final    Monocytes % 03/08/2023 5.9  % Final    Eosinophils % 03/08/2023 1.8  % Final    Basophils % 03/08/2023 0.6  % Final    Absolute Neutrophils 03/08/2023 4.0  1.8 - 7.8 10*9/L Final    Absolute Lymphocytes 03/08/2023 2.6  1.1 - 3.6 10*9/L Final    Absolute Monocytes 03/08/2023 0.4  0.3 - 0.8 10*9/L Final    Absolute Eosinophils 03/08/2023 0.1  0.0 - 0.5 10*9/L Final    Absolute Basophils 03/08/2023 0.0  0.0 - 0.1 10*9/L Final    Microcytosis 03/08/2023 Slight (A)  Not Present Final       Disease severity Henry:     Harvey-Bradshaw Henry for Crohn's Disease    1. General well-being: Very well = 0    2. Abdominal pain:  None = 0    3. Number of liquid or soft stools per day: 2-3    4. Abdominal mass: None = 0    5. Complications: None    Harvey-Bradshaw Henry score:  Remission <5    Assessment     Assessment & Plan:     Erica Henry is a 22 y.o. female who comes to clinic today for follow-up care of penetrating ileal Crohn's disease, characterized by a terminal ileal stricture and intra-abdominal abscesses.  She has undergone a few intra-abdominal abscess drainages (fall 2021-Jan 2022) and recently underwent a laparoscopic ileocecectomy with end ileostomy creation on 01/05/2021 at [facility] followed by a laparoscopic take down of ostomy site and completion right hemicolectomy with side-to-side functional end-to-end ileocolic anastomosis on 05/03/2021.       Penetrating ileal Crohn's disease:   Patient started Humira in June 2022 but lost insurance in 12/2022 and did not contact us until now. Need to restart ASAP and check drug levels later.       Postoperative colonoscopy: Colonoscopy postoperatively in 01/2022 showed an Rutgeerts I 2a recurrence which is not predictive of clinical recurrence.  We will continue with the current approach.  Next colonoscopy in around mid 2024.       TDM: Levels in September 2022 in good range.  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      Health maintenance: Prevnar    Patient received Prevnar 20 06/2022.  Got first shot Shingrix in 08/2022, then forgot second shot, now restart vaccination, 1st 03/2023.     Portions of this record have been created using Scientist, clinical (histocompatibility and immunogenetics). Dictation errors have been sought, but may not have been identified and corrected.      Plan        Patient Instructions   We will arrange restart of Humira    Colonoscopy in July/August ( please call the number below around mid May)    To facilitate easier scheduling    please call GI-procedures to schedule your endoscopic exam Telephone # [phone] opt 1, and then opt 2, to reach GI procedures scheduling    Shingles revaccination today    Continue loperamide 1-2 capsules one to two times daily

## 2023-03-08 ENCOUNTER — Ambulatory Visit
Admit: 2023-03-08 | Discharge: 2023-03-08 | Payer: PRIVATE HEALTH INSURANCE | Attending: Gastroenterology | Primary: Gastroenterology

## 2023-03-08 ENCOUNTER — Ambulatory Visit: Admit: 2023-03-08 | Discharge: 2023-03-08 | Payer: PRIVATE HEALTH INSURANCE

## 2023-03-08 DIAGNOSIS — K50919 Crohn's disease, unspecified, with unspecified complications: Principal | ICD-10-CM

## 2023-03-08 DIAGNOSIS — Z79899 Other long term (current) drug therapy: Principal | ICD-10-CM

## 2023-03-08 DIAGNOSIS — D84821 Immunosuppression due to drug therapy (CMS-HCC): Principal | ICD-10-CM

## 2023-03-08 LAB — CBC W/ AUTO DIFF
BASOPHILS ABSOLUTE COUNT: 0 10*9/L (ref 0.0–0.1)
BASOPHILS RELATIVE PERCENT: 0.6 %
EOSINOPHILS ABSOLUTE COUNT: 0.1 10*9/L (ref 0.0–0.5)
EOSINOPHILS RELATIVE PERCENT: 1.8 %
HEMATOCRIT: 37.8 % (ref 34.0–44.0)
HEMOGLOBIN: 11.9 g/dL (ref 11.3–14.9)
LYMPHOCYTES ABSOLUTE COUNT: 2.6 10*9/L (ref 1.1–3.6)
LYMPHOCYTES RELATIVE PERCENT: 36.3 %
MEAN CORPUSCULAR HEMOGLOBIN CONC: 31.3 g/dL — ABNORMAL LOW (ref 32.0–36.0)
MEAN CORPUSCULAR HEMOGLOBIN: 22.4 pg — ABNORMAL LOW (ref 25.9–32.4)
MEAN CORPUSCULAR VOLUME: 71.6 fL — ABNORMAL LOW (ref 77.6–95.7)
MEAN PLATELET VOLUME: 8.2 fL (ref 6.8–10.7)
MONOCYTES ABSOLUTE COUNT: 0.4 10*9/L (ref 0.3–0.8)
MONOCYTES RELATIVE PERCENT: 5.9 %
NEUTROPHILS ABSOLUTE COUNT: 4 10*9/L (ref 1.8–7.8)
NEUTROPHILS RELATIVE PERCENT: 55.4 %
PLATELET COUNT: 385 10*9/L (ref 150–450)
RED BLOOD CELL COUNT: 5.28 10*12/L — ABNORMAL HIGH (ref 3.95–5.13)
RED CELL DISTRIBUTION WIDTH: 14.2 % (ref 12.2–15.2)
WBC ADJUSTED: 7.2 10*9/L (ref 3.6–11.2)

## 2023-03-08 LAB — COMPREHENSIVE METABOLIC PANEL
ALBUMIN: 4.6 g/dL (ref 3.4–5.0)
ALKALINE PHOSPHATASE: 63 U/L (ref 46–116)
ALT (SGPT): 8 U/L — ABNORMAL LOW (ref 10–49)
ANION GAP: 6 mmol/L (ref 5–14)
AST (SGOT): 17 U/L (ref <=34)
BILIRUBIN TOTAL: 1.6 mg/dL — ABNORMAL HIGH (ref 0.3–1.2)
BLOOD UREA NITROGEN: 9 mg/dL (ref 9–23)
BUN / CREAT RATIO: 11
CALCIUM: 9.8 mg/dL (ref 8.7–10.4)
CHLORIDE: 105 mmol/L (ref 98–107)
CO2: 28 mmol/L (ref 20.0–31.0)
CREATININE: 0.84 mg/dL
EGFR CKD-EPI (2021) FEMALE: >90 mL/min/{1.73_m2} (ref >=60)
GLUCOSE RANDOM: 89 mg/dL (ref 70–179)
POTASSIUM: 3.6 mmol/L (ref 3.4–4.8)
PROTEIN TOTAL: 8.8 g/dL — ABNORMAL HIGH (ref 5.7–8.2)
SODIUM: 139 mmol/L (ref 135–145)

## 2023-03-08 LAB — IRON PANEL
IRON SATURATION: 21 % (ref 20–55)
IRON: 64 ug/dL
TOTAL IRON BINDING CAPACITY: 299 ug/dL (ref 250–425)

## 2023-03-08 LAB — VITAMIN B12: VITAMIN B-12: 367 pg/mL (ref 211–911)

## 2023-03-08 LAB — C-REACTIVE PROTEIN: C-REACTIVE PROTEIN: <4 mg/L (ref <=10.0)

## 2023-03-08 LAB — FERRITIN: FERRITIN: 32.7 ng/mL

## 2023-03-08 MED ORDER — LOPERAMIDE 2 MG CAPSULE
ORAL_CAPSULE | Freq: Two times a day (BID) | ORAL | 6 refills | 45 days | Status: CP | PRN
Start: 2023-03-08 — End: 2024-03-07

## 2023-03-08 MED ORDER — HUMIRA PEN CITRATE FREE STARTER PACK FOR CROHN'S/UC/HS 3 X 80 MG/0.8 ML
PACK | 0 refills | 0 days | Status: CP
Start: 2023-03-08 — End: ?
  Filled 2023-03-26: qty 3, 28d supply, fill #0

## 2023-03-08 MED ORDER — HUMIRA PEN CITRATE FREE 40 MG/0.4 ML
SUBCUTANEOUS | 1 refills | 84 days | Status: CP
Start: 2023-03-08 — End: ?

## 2023-03-08 NOTE — Unmapped (Signed)
MEDICATION ORDER ENTRY DOCUMENTATION    The following medication(s) have been sent to pharmacy for insurance authorization:    Medication: Adalimumab  Dose & Frequency: Reload and then standard maintenance  Pharmacy: [facility]    TB Check: neg 1/22  Hepatitis B Check: neg 1/22    Requesting Physician: Dr. Britt Bolognese    Notes: needs to reload after lapse in insurance/missed doses      Carolyn Stare, RN  [phone] office

## 2023-03-08 NOTE — Unmapped (Addendum)
We will arrange restart of Humira    Colonoscopy in July/August ( please call the number below around mid May)    To facilitate easier scheduling    please call GI-procedures to schedule your endoscopic exam Telephone # [phone] opt 1, and then opt 2, to reach GI procedures scheduling    Shingles revaccination today    Continue loperamide 1-2 capsules one to two times daily

## 2023-03-11 DIAGNOSIS — K50919 Crohn's disease, unspecified, with unspecified complications: Principal | ICD-10-CM

## 2023-03-14 LAB — QUANTIFERON TB GOLD PLUS
QUANTIFERON ANTIGEN 1 MINUS NIL: 0.02 [IU]/mL
QUANTIFERON ANTIGEN 2 MINUS NIL: 0.02 [IU]/mL
QUANTIFERON MITOGEN: 9.98 [IU]/mL
QUANTIFERON TB GOLD PLUS: NEGATIVE
QUANTIFERON TB NIL VALUE: 0.02 [IU]/mL

## 2023-03-14 LAB — TB AG2: TB AG2 VALUE: 0.04

## 2023-03-14 LAB — TB AG1: TB AG1 VALUE: 0.04

## 2023-03-14 LAB — TB NIL: TB NIL VALUE: 0.02

## 2023-03-14 LAB — TB MITOGEN: TB MITOGEN VALUE: 10

## 2023-03-15 LAB — VITAMIN D 25 HYDROXY: VITAMIN D, TOTAL (25OH): 22.5 ng/mL (ref 20.0–80.0)

## 2023-03-16 LAB — HEPATITIS B SURFACE ANTIBODY
HEPATITIS B SURFACE ANTIBODY QUANT: <8 m[IU]/mL (ref <8.00)
HEPATITIS B SURFACE ANTIBODY: NONREACTIVE

## 2023-03-16 LAB — HEPATITIS B SURFACE ANTIGEN: HEPATITIS B SURFACE ANTIGEN: NONREACTIVE

## 2023-03-16 LAB — HEPATITIS B CORE ANTIBODY, TOTAL: HEPATITIS B CORE TOTAL ANTIBODY: NONREACTIVE

## 2023-03-21 NOTE — Unmapped (Unsigned)
***March 21, 2023 at 1:28 PM: Incomplete, PA now approved for reload -  left voicemail for patient to callback - everything prepopulated - JS        [facility] Clinical Assessment & Refill Coordination Note    Reload: Inject 2 pens (160 mg) on day 1 and then 1 pen (80 mg) on day 15    [facility], DOB: June 21, 2001  Phone: There are no phone numbers on file.    All above HIPAA information was verified with {Blank:19197::patient.,patient's caregiver, ***.,patient's family member, ***.}     Was a Nurse, learning disability used for this call? {Blank single:19197::Yes, ***. Patient language is appropriate in WAM,No}    Specialty Medication(s):   {specpharm:59087}     Current Outpatient Medications   Medication Sig Dispense Refill    cholecalciferol, vitamin D3-1,250 mcg, 50,000 unit,, 1,250 mcg (50,000 unit) capsule Take 1 capsule (1,250 mcg total) by mouth once a week for 52 doses. 52 capsule 0    cholestyramine-aspartame (CHOLESTYRAMINE LIGHT) 4 gram PwPk Take 1 packet by mouth Two (2) times a day. 60 packet 3    clobetasoL (TEMOVATE) 0.05 % ointment Apply topically Two (2) times a day. (Patient not taking: Reported on 03/08/2023) 30 g 0    colestipoL (COLESTID) 1 gram tablet Take 2 tablets (2 g total) by mouth Two (2) times a day. Take 2 tablets in the morning. If necessary 1-2 tablets  approx. 10 minutes  before large meals. Take other medications 2-3 hours apart. (Patient not taking: Reported on 03/08/2023) 360 tablet 3    HUMIRA PEN CITRATE FREE 40 MG/0.4 ML Inject the contents of 1 pen (40 mg total) under the skin every fourteen (14) days. 6 each 1    HUMIRA PEN CITRATE FREE STARTER PACK FOR CROHN'S/UC/HS 3 X 80 MG/0.8 ML Inject the contents of 2 pens (160 mg) on day 1, THEN 1 pen (80 mg) on day 15. 3 each 0    loperamide (IMODIUM) 2 mg capsule Take 2 capsules (4 mg total) by mouth two (2) times a day as needed for diarrhea. 180 capsule 6    simethicone (MYLICON) 125 MG chewable tablet Take 2 tablets (250 mg) at 5:00 pm the day before your procedure. Take remaining 2 tablets (250 mg) at least 4 hours before your procedure time. (Patient not taking: Reported on 03/08/2023) 4 tablet 0     No current facility-administered medications for this visit.        Changes to medications: {Blank:19197::Jacilyn reports starting the following medications: ***,Jazmynn Reports stopping the following medications: ***,Lenaya reports no changes at this time.}    No Known Allergies    Changes to allergies: {Blank:19197::Yes: ***,No}    SPECIALTY MEDICATION ADHERENCE     *** *** {Blank:19197::mg,mg/ml,***}: *** days of medicine on hand   *** *** {Blank:19197::mg,mg/ml,***}: *** days of medicine on hand   *** *** {Blank:19197::mg,mg/ml,***}: *** days of medicine on hand   *** *** {Blank:19197::mg,mg/ml,***}: *** days of medicine on hand   *** *** {Blank:19197::mg,mg/ml,***}: *** days of medicine on hand          Specialty medication(s) dose(s) confirmed: {Blank:19197::Regimen is correct and unchanged.,Patient reports changes to the regimen as follows: ***}     Are there any concerns with adherence? {Blank:19197::Yes: ***,No}    Adherence counseling provided? {Blank:19197::Yes: ***,Not needed}    CLINICAL MANAGEMENT AND INTERVENTION      Clinical Benefit Assessment:    Do you feel the medicine is effective or helping  your condition? {Blank:19197::Yes,No,Patient declined to answer}    Clinical Benefit counseling provided? {Blank:19197::Not needed,Reasonable expectations discussed: ***,Labs from *** show evidence of clinical benefit,Progress note from *** shows evidence of clinical benefit,consulted provider regarding clinical benefit concerns,***}    Adverse Effects Assessment:    Are you experiencing any side effects? {Blank:19197::Yes, patient reports experiencing ***. Side effect counseling provided: ***,No}    Are you experiencing difficulty administering your medicine? {Blank:19197::Yes, patient reports ***. Medication administration counseling provided: ***,No}    Quality of Life Assessment:    Quality of Life    Rheumatology  Oncology  Dermatology  Cystic Fibrosis          {DiseaseSpecificQOL:73897}    Have you discussed this with your provider? {Blank:19197::Not needed,Yes,No - pharmacist will consult provider}    Acute Infection Status:    Acute infections noted within Epic:  No active infections  Patient reported infection: {Blank single:19197::None,***- patient reported to provider,***- pharmacy reported to provider}    Therapy Appropriateness:    Is therapy appropriate and patient progressing towards therapeutic goals? {Blank:19197::Yes, therapy is appropriate and should be continued,Pharmacist will consult provider}    DISEASE/MEDICATION-SPECIFIC INFORMATION      {clinicspecificinstructions:59274}    {DISEASESTATESPECIFICASSESSMENT:98894}    PATIENT SPECIFIC NEEDS     Does the patient have any physical, cognitive, or cultural barriers? {Blank single:19197::No,Yes - ***}    Is the patient high risk? {sschighriskpts:78327}    Did the patient require a clinical intervention? {Blank single:19197::No,Yes (If yes, document using the sscrphintervention smartphrase)}    Does the patient require physician intervention or other additional services (i.e., nutrition, smoking cessation, social work)? {Blank single:19197::No,Yes, ***}    SOCIAL DETERMINANTS OF HEALTH     At the [facility], we have learned that life circumstances - like trouble affording food, housing, utilities, or transportation can affect the health of many of our patients.   That is why we wanted to ask: are you currently experiencing any life circumstances that are negatively impacting your health and/or quality of life? {YES/NO/PATIENTDECLINED:93004}    Social Determinants of Health     Financial Resource Strain: Low Risk  (09/26/2022)    Received from [facility]    Overall Financial Resource Strain (CARDIA)     Difficulty of Paying Living Expenses: Not very hard   Internet Connectivity: Not on file   Food Insecurity: No Food Insecurity (09/22/2022)    Received from [facility]    Hunger Vital Sign     Worried About Running Out of Food in the Last Year: Never true     Ran Out of Food in the Last Year: Never true   Tobacco Use: Low Risk  (03/08/2023)    Patient History     Smoking Tobacco Use: Never     Smokeless Tobacco Use: Never     Passive Exposure: Not on file   Housing/Utilities: Low Risk  (12/30/2020)    Housing/Utilities     Within the past 12 months, have you ever stayed: outside, in a car, in a tent, in an overnight shelter, or temporarily in someone else's home (i.e. couch-surfing)?: No     Are you worried about losing your housing?: No     Within the past 12 months, have you been unable to get utilities (heat, electricity) when it was really needed?: No   Alcohol Use: Heavy Drinker (09/26/2022)    Received from [facility]    AUDIT-C     Frequency of Alcohol Consumption: 2-4 times a month  Average Number of Drinks: 3 or 4     Frequency of Binge Drinking: Never   Transportation Needs: No Transportation Needs (09/26/2022)    Received from [facility] - Transportation     Lack of Transportation (Medical): No     Lack of Transportation (Non-Medical): No   Substance Use: Not on file   Health Literacy: Not on file   Physical Activity: Insufficiently Active (09/26/2022)    Received from [facility]    Exercise Vital Sign     Days of Exercise per Week: 1 day     Minutes of Exercise per Session: 30 min   Interpersonal Safety: Not on file   Stress: No Stress Concern Present (09/26/2022)    Received from [facility] of Occupational [facility] - Occupational Stress Questionnaire     Feeling of Stress : Only a little   Intimate Partner Violence: Not At Risk (09/26/2022)    Received from [facility]    Humiliation, Afraid, Rape, and Kick questionnaire     Fear of Current or Ex-Partner: No     Emotionally Abused: No     Physically Abused: No     Sexually Abused: No   Depression: Not at risk (09/22/2022)    Received from [facility]    Depression     PHQ-2 Total Score: 0   Social Connections: Moderately Integrated (09/26/2022)    Received from [facility]    Social Connection and Isolation Panel [NHANES]     Frequency of Communication with Friends and Family: More than three times a week     Frequency of Social Gatherings with Friends and Family: Twice a week     Attends Religious Services: More than 4 times per year     Active Member of [employer] or Organizations: Yes     Attends Banker Meetings: 1 to 4 times per year     Marital Status: Never married       Would you be willing to receive help with any of the needs that you have identified today? {Yes/No/Not applicable:93005}       SHIPPING     Specialty Medication(s) to be Shipped:   {specpharm:59087}    Other medication(s) to be shipped: {Blank:19197::***,No additional medications requested for fill at this time}     Changes to insurance: {Blank:19197::Yes: ***,No}    Patient was informed of new phone menu: {Blank:19197::Yes,No}    Delivery Scheduled: {Blank:19197::Yes, Expected medication delivery date: ***.,Yes, Expected medication delivery date: ***.  However, Rx request for refills was sent to the provider as there are none remaining.,Patient declined refill at this time due to ***.,No, cannot schedule delivery at this time as there are outstanding items that need addressed.  This note has been handed off to the provider for follow up.,Due to patient insurance changes, unable to fill at [facility], please route Rx to *** specialty pharmacy}     Medication will be delivered via {Blank:19197::UPS,Next Day Courier,Same Day Courier,Clinic Courier - *** clinic,***} to the confirmed {Blank:19197::prescription,temporary} address in [facility].    The patient will receive a drug information handout for each medication shipped and additional FDA Medication Guides as required.  Verified that patient has previously received a Conservation officer, historic buildings and a Surveyor, mining.    The patient or caregiver noted above participated in the development of this care plan and knows that they can request review of or adjustments to the care  plan at any time.      All of the patient's questions and concerns have been addressed.    Teofilo Pod, PharmD   [facility] Specialty Pharmacist

## 2023-03-21 NOTE — Unmapped (Signed)
Approval in place for Humira reload. [place] [facility] will reach out to patient to coordinate shipment and review dosing.

## 2023-03-22 MED ORDER — EMPTY CONTAINER
3 refills | 0 days
Start: 2023-03-22 — End: ?

## 2023-03-22 NOTE — Unmapped (Signed)
Clinical Assessment Needed For: Dose Change  Medication: Humira 80 mg/0.8 mL pens  Last Fill Date/Day Supply: 12/21/22 / 28- Humira 40 mg/0.4 mL pens  Copay $5  Was previous dose already scheduled to fill: No    Notes to Pharmacist: Re-loading

## 2023-03-26 MED FILL — EMPTY CONTAINER: 120 days supply | Qty: 1 | Fill #0

## 2023-04-12 NOTE — Unmapped (Signed)
[facility] Clinical Assessment & Refill Coordination Note    Patient reported the first loading dose went well.  She ended up giving dose on 4/12 and final loading dose will be due 4/26  Start of Humira 40mg /0.77mL every 14 day maintenance dose due: 5/10 & 5/24    [facility], DOB: 03/09/2001  Phone: There are no phone numbers on file.    All above HIPAA information was verified with patient.     Was a Nurse, learning disability used for this call? No    Specialty Medication(s):   Inflammatory Disorders: Humira     Current Outpatient Medications   Medication Sig Dispense Refill    cholecalciferol, vitamin D3-1,250 mcg, 50,000 unit,, 1,250 mcg (50,000 unit) capsule Take 1 capsule (1,250 mcg total) by mouth once a week for 52 doses. 52 capsule 0    cholestyramine-aspartame (CHOLESTYRAMINE LIGHT) 4 gram PwPk Take 1 packet by mouth Two (2) times a day. 60 packet 3    clobetasoL (TEMOVATE) 0.05 % ointment Apply topically Two (2) times a day. (Patient not taking: Reported on 03/08/2023) 30 g 0    colestipoL (COLESTID) 1 gram tablet Take 2 tablets (2 g total) by mouth Two (2) times a day. Take 2 tablets in the morning. If necessary 1-2 tablets  approx. 10 minutes  before large meals. Take other medications 2-3 hours apart. (Patient not taking: Reported on 03/08/2023) 360 tablet 3    empty container Misc Use as directed 1 each 3    HUMIRA PEN CITRATE FREE 40 MG/0.4 ML Inject the contents of 1 pen (40 mg total) under the skin every fourteen (14) days. 6 each 1    HUMIRA PEN CITRATE FREE STARTER PACK FOR CROHN'S/UC/HS 3 X 80 MG/0.8 ML Inject the contents of 2 pens (160 mg) on day 1, THEN 1 pen (80 mg) on day 15. 3 each 0    loperamide (IMODIUM) 2 mg capsule Take 2 capsules (4 mg total) by mouth two (2) times a day as needed for diarrhea. 180 capsule 6    simethicone (MYLICON) 125 MG chewable tablet Take 2 tablets (250 mg) at 5:00 pm the day before your procedure. Take remaining 2 tablets (250 mg) at least 4 hours before your procedure time. (Patient not taking: Reported on 03/08/2023) 4 tablet 0     No current facility-administered medications for this visit.        Changes to medications: Payson reports no changes at this time.    No Known Allergies    Changes to allergies: No    SPECIALTY MEDICATION ADHERENCE     Humira 80  mg/0.60mL : 1 days of medicine on hand     Medication Adherence    Patient reported X missed doses in the last month: 0  Specialty Medication: Humira load          Specialty medication(s) dose(s) confirmed: Regimen is correct and unchanged.     Are there any concerns with adherence? No    Adherence counseling provided? Not needed    CLINICAL MANAGEMENT AND INTERVENTION      Clinical Benefit Assessment:    Do you feel the medicine is effective or helping your condition? Yes    Clinical Benefit counseling provided? Not needed    Adverse Effects Assessment:    Are you experiencing any side effects? No    Are you experiencing difficulty administering your medicine? No    Quality of Life Assessment:    Quality of Life  Rheumatology  Oncology  Dermatology  Cystic Fibrosis          How many days over the past month did your CD  keep you from your normal activities? For example, brushing your teeth or getting up in the morning. 0    Have you discussed this with your provider? Not needed    Acute Infection Status:    Acute infections noted within Epic:  No active infections  Patient reported infection: None    Therapy Appropriateness:    Is therapy appropriate and patient progressing towards therapeutic goals? Yes, therapy is appropriate and should be continued    DISEASE/MEDICATION-SPECIFIC INFORMATION      For patients on injectable medications: Patient currently has 1 doses left.  Next injection is scheduled for 4/26.    Chronic Inflammatory Diseases: Have you experienced any flares in the last month? No  Has this been reported to your provider? Not applicable    PATIENT SPECIFIC NEEDS     Does the patient have any physical, cognitive, or cultural barriers? No    Is the patient high risk? No    Did the patient require a clinical intervention? No    Does the patient require physician intervention or other additional services (i.e., nutrition, smoking cessation, social work)? No    SOCIAL DETERMINANTS OF HEALTH     At the [facility], we have learned that life circumstances - like trouble affording food, housing, utilities, or transportation can affect the health of many of our patients.   That is why we wanted to ask: are you currently experiencing any life circumstances that are negatively impacting your health and/or quality of life? No    Social Determinants of Health     Financial Resource Strain: Low Risk  (09/26/2022)    Received from [facility]    Overall Financial Resource Strain (CARDIA)     Difficulty of Paying Living Expenses: Not very hard   Internet Connectivity: Not on file   Food Insecurity: No Food Insecurity (09/22/2022)    Received from [facility]    Hunger Vital Sign     Worried About Running Out of Food in the Last Year: Never true     Ran Out of Food in the Last Year: Never true   Tobacco Use: Low Risk  (03/08/2023)    Patient History     Smoking Tobacco Use: Never     Smokeless Tobacco Use: Never     Passive Exposure: Not on file   Housing/Utilities: Low Risk  (12/30/2020)    Housing/Utilities     Within the past 12 months, have you ever stayed: outside, in a car, in a tent, in an overnight shelter, or temporarily in someone else's home (i.e. couch-surfing)?: No     Are you worried about losing your housing?: No     Within the past 12 months, have you been unable to get utilities (heat, electricity) when it was really needed?: No   Alcohol Use: Heavy Drinker (09/26/2022)    Received from [facility]    AUDIT-C     Frequency of Alcohol Consumption: 2-4 times a month     Average Number of Drinks: 3 or 4     Frequency of Binge Drinking: Never   Transportation Needs: No Transportation Needs (09/26/2022)    Received from [facility] - Transportation     Lack of Transportation (Medical): No     Lack of Transportation (Non-Medical): No  Substance Use: Not on file   Health Literacy: Not on file   Physical Activity: Insufficiently Active (09/26/2022)    Received from [facility]    Exercise Vital Sign     Days of Exercise per Week: 1 day     Minutes of Exercise per Session: 30 min   Interpersonal Safety: Not on file   Stress: No Stress Concern Present (09/26/2022)    Received from [facility] of Occupational Health - Occupational Stress Questionnaire     Feeling of Stress : Only a little   Intimate Partner Violence: Not At Risk (09/26/2022)    Received from [facility]    Humiliation, Afraid, Rape, and Kick questionnaire     Fear of Current or Ex-Partner: No     Emotionally Abused: No     Physically Abused: No     Sexually Abused: No   Depression: Not at risk (09/22/2022)    Received from [facility]    Depression     PHQ-2 Total Score: 0   Social Connections: Moderately Integrated (09/26/2022)    Received from [facility]    Social Connection and Isolation Panel [NHANES]     Frequency of Communication with Friends and Family: More than three times a week     Frequency of Social Gatherings with Friends and Family: Twice a week     Attends Religious Services: More than 4 times per year     Active Member of [employer] or Organizations: Yes     Attends Banker Meetings: 1 to 4 times per year     Marital Status: Never married       Would you be willing to receive help with any of the needs that you have identified today? Not applicable       SHIPPING     Specialty Medication(s) to be Shipped:   Inflammatory Disorders: Humira    Other medication(s) to be shipped: No additional medications requested for fill at this time     Changes to insurance: No    Delivery Scheduled: Yes, Expected medication delivery date: 5/2.     Medication will be delivered via UPS to the confirmed prescription address in [facility].    The patient will receive a drug information handout for each medication shipped and additional FDA Medication Guides as required.  Verified that patient has previously received a Conservation officer, historic buildings and a Surveyor, mining.    The patient or caregiver noted above participated in the development of this care plan and knows that they can request review of or adjustments to the care plan at any time.      All of the patient's questions and concerns have been addressed.    Teofilo Pod, PharmD   [facility] Specialty Pharmacist

## 2023-04-18 MED FILL — HUMIRA PEN CITRATE FREE 40 MG/0.4 ML: SUBCUTANEOUS | 28 days supply | Qty: 2 | Fill #0

## 2023-05-10 NOTE — Unmapped (Signed)
[facility] Refill Coordination Note    Specialty Medication(s) to be Shipped:   Inflammatory Disorders: Humira    Other medication(s) to be shipped: No additional medications requested for fill at this time     [facility], DOB: 18-Dec-2001  Phone: There are no phone numbers on file.      All above HIPAA information was verified with patient.     Was a Nurse, learning disability used for this call? No    Completed refill call assessment today to schedule patient's medication shipment from the [facility] [phone]).  All relevant notes have been reviewed.     Specialty medication(s) and dose(s) confirmed: Regimen is correct and unchanged.   Changes to medications: Viletta reports no changes at this time.  Changes to insurance: No  New side effects reported not previously addressed with a pharmacist or physician: None reported  Questions for the pharmacist: No    Confirmed patient received a Conservation officer, historic buildings and a Surveyor, mining with first shipment. The patient will receive a drug information handout for each medication shipped and additional FDA Medication Guides as required.       DISEASE/MEDICATION-SPECIFIC INFORMATION        For patients on injectable medications: Patient currently has 0 doses left.  Next injection is scheduled for 5/24.    SPECIALTY MEDICATION ADHERENCE     Medication Adherence    Patient reported X missed doses in the last month: 0  Specialty Medication: HUMIRA(CF) PEN 40 mg/0.4 mL  Patient is on additional specialty medications: No              Were doses missed due to medication being on hold? No    Humira 40/0.4 mg/ml: 1 days of medicine on hand        REFERRAL TO PHARMACIST     Referral to the pharmacist: Not needed      [facility]     Shipping address confirmed in Epic.       Delivery Scheduled: Yes, Expected medication delivery date: 05/16/33.     Medication will be delivered via UPS to the prescription address in Epic WAM.    [name]   [facility] Specialty Technician

## 2023-05-15 NOTE — Unmapped (Signed)
Colonoscopy  Procedure #1     Procedure #2   [id number]  MRN   [email or web address]  Endoscopist     Is the patient's health insurance [address], [facility]), or [employer]?   TRUE  Urgent procedure     Are you pregnant?     Are you in the process of scheduling or awaiting results of a heart ultrasound, stress test, or catheterization to evaluate new or worsening chest pain, dizziness, or shortness of breath?     Do you take: Plavix (clopidogrel), Coumadin (warfarin), Lovenox (enoxaparin), Pradaxa (dabigatran), Effient (prasugrel), Xarelto (rivaroxaban), Eliquis (apixaban), Pletal (cilostazol), or Brilinta (ticagrelor)?          Which of the above medications are you taking?          What is the name of the medical practice that manages this medication?          What is the name of the medical provider who manages this medication?     Do you have hemophilia, von Willebrand disease, or low platelets?     Do you have a pacemaker or implanted cardiac defibrillator?     Has a [facility] provider specified the location(s)?     Which location(s) did the [facility] provider specify?        [facility]        [place]        [facility]        [facility]     Is procedure indication for variceal banding (this does NOT include variceal screening)?     Have you had a heart attack, stroke or heart stent placement within the past 6 months?     Month of event     Year of event (ONLY ENTER LAST 2 DIGITS)        5  Height (feet)   5  Height (inches)   120  Weight (pounds)   20.0  BMI          Did the ordering provider specify a bowel prep?          What bowel prep was specified?     Do you have chronic kidney disease?     Do you have chronic constipation or have you had poor quality bowel preps for past colonoscopies?   TRUE  Do you have Crohn's disease or ulcerative colitis?     Have you had weight loss surgery?          When you walk around your house or grocery store, do you have to stop and rest due to shortness of breath, chest pain, or light-headedness?     Do you ever use supplemental oxygen?     Have you been hospitalized for cirrhosis of the liver or heart failure in the last 12 months?     Have you been treated for mouth or throat cancer with radiation or surgery?     Have you been told that it is difficult for doctors to insert a breathing tube in you during anesthesia?     Have you had a heart or lung transplant?          Are you on dialysis?     Do you have cirrhosis of the liver?     Do you have myasthenia gravis?     Is the patient a prisoner?          Have you been diagnosed with sleep apnea or do you wear a  CPAP machine at night?   TRUE  Are you younger than 30?     Have you previously received propofol sedation administered by an anesthesiologist for a GI procedure?     Do you drink an average of more than 3 drinks of alcohol per day?     Do you regularly take suboxone or any prescription medications for chronic pain?     Do you regularly take Ativan, Klonopin, Xanax, Valium, lorazepam, clonazepam, alprazolam, or diazepam?     Have you previously had difficulty with sedation during a GI procedure?     Have you been diagnosed with PTSD?     Are you allergic to fentanyl or midazolam (Versed)?     Do you take medications for HIV?   ################# ## ###################################################################################################################   MRN:          [id number]   Anticoag Review:  No   Nurse Triage:  No   GI Clinic Consult:  No   Procedure(s):  Colonoscopy     0   [facility]     [facility]     [place]        Endoscopist:  herfarth   Urgent:            Yes   Prep:               Miralax Prep                  ################# ## ###################################################################################################################

## 2023-05-16 ENCOUNTER — Other Ambulatory Visit (HOSPITAL_BASED_OUTPATIENT_CLINIC_OR_DEPARTMENT_OTHER): Payer: Self-pay

## 2023-05-16 MED ORDER — AMOXICILLIN-POT CLAVULANATE 875-125 MG PO TABS
1.0000 | ORAL_TABLET | Freq: Two times a day (BID) | ORAL | 0 refills | Status: AC
  Filled 2023-05-16: qty 14, 7d supply, fill #0

## 2023-05-16 MED FILL — HUMIRA PEN CITRATE FREE 40 MG/0.4 ML: SUBCUTANEOUS | 28 days supply | Qty: 2 | Fill #1

## 2023-06-07 NOTE — Unmapped (Signed)
[facility] Specialty Pharm[facility] Refill Coordination Note    Specialty Medication(s) to be Shipped:   Inflammatory Disorders: Humira    Other medication(s) to be shipped: No additional medications requested for fill at this time     Erica Henry, DOB: 19-Nov-2001  Phone: There are no phone numbers on file.      All above HIPAA information was verified with patient.     Was a Nurse, learning disability used for this call? No    Completed refill call assessment today to schedule patient's medication shipment from the [facility] [phone]).  All relevant notes have been reviewed.     Specialty medication(s) and dose(s) confirmed: Regimen is correct and unchanged.   Changes to medications: Dalasia reports no changes at this time.  Changes to insurance: No  New side effects reported not previously addressed with a pharmacist or physician: None reported  Questions for the pharmacist: No    Confirmed patient received a Conservation officer, historic buildings and a Surveyor, mining with first shipment. The patient will receive a drug information handout for each medication shipped and additional FDA Medication Guides as required.       DISEASE/MEDICATION-SPECIFIC INFORMATION        For patients on injectable medications: Patient currently has 1 doses left.  Next injection is scheduled for 6/21.    SPECIALTY MEDICATION ADHERENCE     Medication Adherence    Patient reported X missed doses in the last month: 0  Specialty Medication: HUMIRA(CF) PEN 40 mg/0.4 mL  Patient is on additional specialty medications: No              Were doses missed due to medication being on hold? No    Humira 40/0.4 mg/ml: 1 days of medicine on hand        REFERRAL TO PHARMACIST     Referral to the pharmacist: Not needed      [facility]     Shipping address confirmed in Epic.       Delivery Scheduled: Yes, Expected medication delivery date: 06/14/23.     Medication will be delivered via UPS to the prescription address in Epic WAM.    [name]   [facility] Specialty Technician

## 2023-06-13 MED FILL — HUMIRA PEN CITRATE FREE 40 MG/0.4 ML: SUBCUTANEOUS | 28 days supply | Qty: 2 | Fill #2

## 2023-06-26 NOTE — Unmapped (Signed)
Spoke with Erica Henry regarding upcoming GI procedure on 06/29/23 at [facility] location at 1330 with an arrival time of 1230.     Verified that [facility] prep instructions had been received, reviewed and understood.      Verified prep medications ordered/received.    Reviewed information regarding holding vitamins/supplements the day before and day of procedure. All other approved medications should be taken at least two hours prior to appointment time.      Reviewed low fiber diet should have started three days prior to procedure.    Reviewed LIQUID diet requirement the entire day prior to procedure and NOTHING AT ALL 2 hours prior to procedure    Reviewed driver requirement (18 or older, must be able to drive patient home, must remain within 20 minutes of facility and reachable by cell phone).    No further questions at this time.

## 2023-06-29 ENCOUNTER — Ambulatory Visit: Admit: 2023-06-29 | Discharge: 2023-06-29 | Payer: PRIVATE HEALTH INSURANCE

## 2023-06-29 ENCOUNTER — Encounter: Admit: 2023-06-29 | Discharge: 2023-06-29 | Payer: PRIVATE HEALTH INSURANCE

## 2023-06-29 MED ADMIN — Propofol (DIPRIVAN) injection: INTRAVENOUS | @ 18:00:00 | Stop: 2023-06-29

## 2023-06-29 MED ADMIN — lidocaine (PF) (XYLOCAINE-MPF) 20 mg/mL (2 %) injection: INTRAVENOUS | @ 18:00:00 | Stop: 2023-06-29

## 2023-06-29 MED ADMIN — lactated Ringers infusion: 10 mL/h | INTRAVENOUS | @ 17:00:00 | Stop: 2023-06-29

## 2023-06-29 MED ADMIN — propofol (DIPRIVAN) infusion 10 mg/mL: INTRAVENOUS | @ 18:00:00 | Stop: 2023-06-29

## 2023-07-04 NOTE — Unmapped (Signed)
[facility] Special[facility] Pharm[facility] Refill Coordination Note    Specialty Medication(s) to be Shipped:   Inflammatory Disorders: Humira    Other medication(s) to be shipped: No additional medications requested for fill at this time     Erica Henry, DOB: [birth year]  Phone: There are no phone numbers on file.      All above HIPAA information was verified with patient.     Was a Nurse, learning disability used for this call? No    Completed refill call assessment today to schedule patient's medication shipment from the [facility] [phone]).  All relevant notes have been reviewed.     Specialty medication(s) and dose(s) confirmed: Regimen is correct and unchanged.   Changes to medications: Erica Henry reports no changes at this time.  Changes to insurance: No  New side effects reported not previously addressed with a pharmacist or physician: None reported  Questions for the pharmacist: No    Confirmed patient received a Conservation officer, historic buildings and a Surveyor, mining with first shipment. The patient will receive a drug information handout for each medication shipped and additional FDA Medication Guides as required.       DISEASE/MEDICATION-SPECIFIC INFORMATION        For patients on injectable medications: Patient currently has 1 doses left.  Next injection is scheduled for 7/19 .    SPECIALTY MEDICATION ADHERENCE              Were doses missed due to medication being on hold? No    N/a    REFERRAL TO PHARMACIST     Referral to the pharmacist: Not needed      [facility]     Shipping address confirmed in Epic.       Delivery Scheduled: Yes, Expected medication delivery date: 7/25.     Medication will be delivered via UPS to the prescription address in Epic WAM.    Julianne Rice, PharmD   [facility] Specialty Pharmacist

## 2023-07-11 MED FILL — HUMIRA PEN CITRATE FREE 40 MG/0.4 ML: SUBCUTANEOUS | 28 days supply | Qty: 2 | Fill #3

## 2023-08-03 NOTE — Unmapped (Signed)
[facility] Specialty Pharmacy Refill Coordination Note    Erica Henry, DOB: 2001-02-20  Phone: There are no phone numbers on file.      All above HIPAA information was verified with patient.         08/03/2023     1:21 PM   Specialty Rx Medication Refill Questionnaire   Which Medications would you like refilled and shipped? Humira   Please list all current allergies: N/A   Have you missed any doses in the last 30 days? No   Have you had any changes to your medication(s) since your last refill? No   How many days remaining of each medication do you have at home? 1   If receiving an injectable medication, next injection date is 08/03/2023   Have you experienced any side effects in the last 30 days? No   Please enter the full address (street address, city, state, zip code) where you would like your medication(s) to be delivered to. [address], [place], [place] [address]   Please specify on which day you would like your medication(s) to arrive. Note: if you need your medication(s) within 3 days, please call the pharmacy to schedule your order at [phone]  08/09/2023   Has your insurance changed since your last refill? No   Would you like a pharmacist to call you to discuss your medication(s)? No   Do you require a signature for your package? (Note: if we are billing Medicare Part B or your order contains a controlled substance, we will require a signature) No         Completed refill call assessment today to schedule patient's medication shipment from the [facility] [phone]).  All relevant notes have been reviewed.       Confirmed patient received a Conservation officer, historic buildings and a Surveyor, mining with first shipment. The patient will receive a drug information handout for each medication shipped and additional FDA Medication Guides as required.         REFERRAL TO PHARMACIST     Referral to the pharmacist: Not needed      [facility]     Shipping address confirmed in Epic. Delivery Scheduled: Yes, Expected medication delivery date: 08/09/23.     Medication will be delivered via UPS to the prescription address in Epic WAM.    [name]   [facility] Specialty Technician

## 2023-08-07 NOTE — Unmapped (Unsigned)
[facility]   INFLAMMATORY BOWEL DISEASE CLINIC  FOLLOW-UP CONSULT NOTE             Referring physician:   Referring, None Per Patient  [address] [place],  [place] [address]    Today, I saw Craig Guess for initial consultation in the [facility] regarding Crohn's Disease.          HPI: Ms. Boris Lown is a 22 y.o.  African-American lady comes to clinic today to follow-up care for penetrating ileal Crohn's disease, characterized by a terminal ileal stricture and intra-abdominal abscesses.     Patient had lapse in insurance and is off Humira since [facility] January. Now back with insurance . 1-2 soft bowel movements daily. 2 imodium capsules daily. Stable weight, no extraintestinal manifestations of CD.     Anda Kraft Index for Crohn's Disease  General Well Being: 0 = Very well  Abdominal Pain:  0 = none  Number of Liquid Stools Per day: 2  Abdominal Mass:  0 = None  Number of Extraintestinal Manifestations:  0   1 point each for: 1. Arthritis/arthralgias, 2. Iritis/uveitis, 3. Active perianal dz, 4. Other active fistula, 5. Erythema nodosum or pyoderma, 6. Other    Total HBI Score (0-25):  2     Score:  Remission < 5;  Mild dz 5-7;  Moderate dz 8-16;  Severe > 16      Review of Systems: Positive as above. Otherwise, the balance of all systems is negative.     IBD HISTORY:     Disease phenotype: Penetrating Crohn's disease    Year of disease onset:  2020      Brief IBD Disease Course:    2020/early 2021: Had non-localized abdominal pain. Work-up in [place]. Had a colonoscopy in Nov 2020 and was told that it was normal (no records).  Sept 2021: Moved to [place]. Abdominal pain got worse. Was found to have abdominal abscesses- drained and was started on antibiotics.   Jan 2022: Admitted to [facility] with intra-abdominal abscess seen on CT.  Drain placed.  Underwent a colonoscopy that showed TI stricturing that was not traversed.  Based on these findings, she was determined to have penetrating Crohn's disease. Underwent a laparoscopic ileocecectomy with end ileostomy.   Surgical specimen pathology was consistent with Crohn's disease (chronic enteritis with transmural inflammation and numerous noncaseating granulomas).  Did not receive steroids or any IBD directed therapies during her hospital stay.  February 2022: Follow-up CT scan showed no new intra-abdominal collections.  Some thickening noted in the rectum and hepatic flexure.   May 2022: Underwent laparoscopic take down of ostomy site but given concern for health of R colon, underwent completion right hemicolectomy with hepatic flexure mobilization, side-to-side functional end-to-end ileocolic anastomosis with Dr. Neysa Hotter.  June 2022 start of adalimumab  12/2022 stop of Humira by patient due to loss of insurance  03/2023 restart Humira      Last endoscopy:     Colonoscopy 01/2022     Patent end-to-end ileo-colonic anastomosis,                          characterized by ulceration, Rutgeert's i2a.                         - Simple Endoscopic Score for Crohn's Disease: 0,  Crohn's disease, in remission.                         - Pseudopolyps in the sigmoid colon.                         - No specimens collected.                         No significant recurrence of Crohn's disease. Continue                          Humira.      Colonoscopy 12/31/2020  Impression:            - The entire examined colon is normal.                         - Stricture in the terminal ileum. Not traversed.                         - No specimens collected.    Last imaging:      CT A/P 01/24/2021 (post-op outpatient follow-up)    -Diffuse wall thickening of the colon extending to the rectum with mild stranding and edema predominantly about the hepatic flexure concerning for a component of active inflammatory disease.  -Interval decreased size of left lower quadrant peripherally enhancing collection/abscess.  -Redemonstrated additional peripheral enhancing cystic foci in the adnexa which may represent additional foci of collections versus ovarian follicle/hydrosalpinx.    CT A/P 01/12/2021 (post op, done due to leucocytosis and tachycardia)    --Interval ileocecectomy with right lower quadrant end ileostomy. Wall thickening/inflammatory change of the distal ileum at the ileostomy as well as of the colon at the hepatic flexure, new. Inflammatory changes of the rectosigmoid colon, improved since prior.     --Ill-defined hyperdense fluid in the right paracolic gutter, equivocal for hematoma versus small amount of extravasated enteric contrast, though no sites of bowel leak is identified. Trace hyperdense fluid versus peritoneal enhancement is seen extending anteriorly. If there are clinical concerns of bowel leak, fistulogram through the ileocecal ostomy could be performed.     --Peripherally enhancing fluid collection concerning for abscess in the deep posterior pelvis is decreased in size, now 4.3 x 1.9 cm. Interval removal of pelvic drains. Tract extends from this fluid collection towards the sigmoid colon, with fistula not excluded. Tract corresponding to site of prior drain placement also extends from this through the left posterior gluteal soft tissues.     --Additional fluid density structure in the left hemipelvis is mildly increased from prior. This could represent a left adnexal/ovarian cyst versus hydrosalpinx or residual fluid/phlegmon from previously drained fluid collection with interval catheter removal.     --Wall thickening/inflammatory change involving distal ileum at the ileostomy, hepatic flexure, and rectosigmoid colon, likely chronic inflammation.     --Small locules of air within the urinary bladder. No bladder fistula seen. Correlate for evidence of recent instrumentation.     --Postsurgical changes of the ventral abdominal wall. Tiny partially-organized fluid and air-containing collection at the umbilicus likely corresponding to site of prior port/trocar placement; findings favor seroma, though tiny developing superficial abscess not excluded. Correlate with exam findings.       CT A/P 12/29/2019 (at the time of admission)  -Left transgluteal intra-abdominal drain  with tip terminating in the left lower pelvis. Small volume free fluid along the inferior aspect of the drain tip. Interval development of new rim-enhancing fluid collection in the posterior lower pelvis measuring 3.8 x 2.9 cm, concerning for new intraabdominal abscess and likely amenable to drainage.     -Long segmental wall thickening of the terminal ileum and sigmoid colon, compatible with known Crohn's.        Surgical specimen pathology 01/05/2021    A: Small bowel cecum, ileocecectomy  - Segment of terminal ileum and proximal colon with patchy severely active chronic enteritis with fissuring ulceration, perforation, and stricture, transmural inflammation, numerous noncaseating granulomas, and acute and chronic serositis with adhesions, consistent with clinical history of Crohn's disease  - No CMV viral cytopathic effect, granuloma, or dysplasia identified  - Margins appear histologically viable and negative for dysplasia or malignancy with proximal margin demonstrating rare noncaseating granuloma  - Appendix with dense adhesions and acute and chronic serositis, secondary to separate associated fissuring ulceration  - Two lymph nodes, negative for malignancy, with rare noncaseating granuloma (0/2)  - Special stains for micro-organisms will be reported as an addendum    IBD health maintenance:  Influenza vaccine: No  Covid vaccine: No  Pneumonia vaccine: No  Hepatitis B:   TB testing: Yes- 12/29/2020  Chickenpox/Shingles history: Has not had chickenpox. Has not had Shingles vaccines.  Bone densitometry: -  PAP smear: -    Past Medical History:   Past medical history:   Crohn's, 2021    Past surgical history:   Laparoscopic ileocecectomy, end ileostomy creation, 01/05/2021  Fracture tibia repair, 2014    Family history:   No colon cancer or IBD.    Social history:   Does not smoke.  Occasional alcohol.  Marijuana occasional. Last use was Sept.  Originally lived in [place]. Moved to [place] with her sister.   Lives in [place]. Lives with her sister.  Working intermittently at [employer] - about to start working more consistently.    Allergies:   No Known Allergies      Medications:     Current Outpatient Medications   Medication Sig Dispense Refill    cholestyramine-aspartame (CHOLESTYRAMINE LIGHT) 4 gram PwPk Take 1 packet by mouth Two (2) times a day. 60 packet 3    empty container Misc Use as directed 1 each 3    HUMIRA PEN CITRATE FREE 40 MG/0.4 ML Inject the contents of 1 pen (40 mg total) under the skin every fourteen (14) days. 6 each 1    HUMIRA PEN CITRATE FREE STARTER PACK FOR CROHN'S/UC/HS 3 X 80 MG/0.8 ML Inject the contents of 2 pens (160 mg) on day 1, THEN 1 pen (80 mg) on day 15. 3 each 0    loperamide (IMODIUM) 2 mg capsule Take 2 capsules (4 mg total) by mouth two (2) times a day as needed for diarrhea. 180 capsule 6    simethicone (MYLICON) 125 MG chewable tablet Take 2 tablets (250 mg) at 5:00 pm the day before your procedure. Take remaining 2 tablets (250 mg) at least 4 hours before your procedure time. (Patient not taking: Reported on 03/08/2023) 4 tablet 0     No current facility-administered medications for this visit.               Physical Exam:   There were no vitals taken for this visit.     BP Readings from Last 3 Encounters:   06/29/23 88/59   03/08/23  108/72   06/29/22 103/76      Wt Readings from Last 3 Encounters:   06/29/23 54.4 kg (120 lb)   03/08/23 56.3 kg (124 lb 3.2 oz)   06/29/22 59 kg (130 lb)      BMI: Estimated body mass index is 19.97 kg/m?? as calculated from the following:    Height as of 06/29/23: 165.1 cm (5' 5).    Weight as of 06/29/23: 54.4 kg (120 lb).    BSA: Estimated body surface area is 1.58 meters squared as calculated from the following:    Height as of 06/29/23: 165.1 cm (5' 5).    Weight as of 06/29/23: 54.4 kg (120 lb).    GEN: no apparent distress, appears comfortable on exam  HEENT: PEERL, OP clear with no erythema/lesions/exudate, mucous membranes moist  NECK: Supple, no lymphadenopathy  LUNGS: CTAB, no wheezes, rales, or rhonchi  CV: S1/S2, RRR, no murmurs  ABD: Soft,non-tender, non-distended. Laparoscopic surgical incision sites c/d/i  Extremities: no cyanosis, clubbing or edema, normal gait  Psych: affect appropriate, A&O x3  SKIN: no visible lesions on face, neck, arms, abdomen      Labs    No visits with results within 1 Month(s) from this visit.   Latest known visit with results is:   Admission on 06/29/2023, Discharged on 06/29/2023   Component Date Value Ref Range Status    Adalimumab 06/29/2023 12.5  mcg/mL Final    Diagnosis 06/29/2023    Final                    Value:A: Sigmoid colon, biopsy:  -Colonic mucosa with lymphoid aggregate and mild goblet cell hyperplasia.      This electronic signature is attestation that the pathologist personally reviewed the submitted material(s) and the final diagnosis reflects that evaluation.      Diagnosis Comment 06/29/2023    Final                    Value:Additional levels were examined.      Clinical History 06/29/2023    Final                    Value:Crohn's disease; endoscopic impression: Patent end-to-side ileo-colonic anastomosis, characterized by healthy appearing mucosa; One 6 mm polyp in the sigmoid colon, removed with a cold snare. Crohn's disease in remission.        Gross Description 06/29/2023    Final                    Value:A.   Label: Sigmoid colon cold snare polyp x 1  Size: 5 x 5 x 2 mm  Appearance: Aggregate of 2 tan tissue fragments  Block Summary: A1, NTR    (Currier)      Microscopic Description 06/29/2023    Final                    Value:Microscopic examination substantiates the above diagnosis.      Disclaimer 06/29/2023    Final                    Value:Unless otherwise specified, specimens are preserved using 10% neutral buffered formalin. For cases in which immunohistochemical and/or in-situ hybridization stains are performed, the following statement applies: Appropriate controls for each stain (positive controls with or without negative controls) have been evaluated and stain as expected. These stains have not been separately validated for use on  decalcified specimens and should be interpreted with caution in that setting. Some of the reagents used for these stains may be classified as analyte specific reagents (ASR). Tests using ASRs were developed, and their performance characteristics were determined, by the Anatomic Pathology Department [facility]). They have not been cleared or approved by the [place] Food and Drug Administration (FDA). The FDA does not require these tests to go through premarket FDA review. These tests are used for clinical purposes. They should not be regarded as investigational or for                           research. This laboratory is certified under the Clinical Laboratory Improvement Amendments (CLIA) as qualified to perform high complexity clinical laboratory testing.         Disease severity index:     Harvey-Bradshaw Index for Crohn's Disease    1. General well-being: Very well = 0    2. Abdominal pain:  None = 0    3. Number of liquid or soft stools per day: 2-3    4. Abdominal mass: None = 0    5. Complications: None    Harvey-Bradshaw Index score:  Remission <5    Assessment     Assessment & Plan:     Arnola Nordby is a 22 y.o. female who comes to clinic today for follow-up care of penetrating ileal Crohn's disease, characterized by a terminal ileal stricture and intra-abdominal abscesses.  She has undergone a few intra-abdominal abscess drainages (fall 2021-Jan 2022) and recently underwent a laparoscopic ileocecectomy with end ileostomy creation on 01/05/2021 at [facility] followed by a laparoscopic take down of ostomy site and completion right hemicolectomy with side-to-side functional end-to-end ileocolic anastomosis on 05/03/2021.       Penetrating ileal Crohn's disease:   Patient started Humira in June 2022 but lost insurance in 12/2022 and did not contact us until now. Need to restart ASAP and check drug levels later.       Postoperative colonoscopy: Colonoscopy postoperatively in 01/2022 showed an Rutgeerts I 2a recurrence which is not predictive of clinical recurrence.  We will continue with the current approach.  Next colonoscopy in around mid 2024.       TDM: Levels in September 2022 in good range.  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      Health maintenance: Prevnar    Patient received Prevnar 20 06/2022.  Got first shot Shingrix in 08/2022, then forgot second shot, now restart vaccination, 1st 03/2023.     Portions of this record have been created using Scientist, clinical (histocompatibility and immunogenetics). Dictation errors have been sought, but may not have been identified and corrected.      Plan        There are no Patient Instructions on file for this visit.

## 2023-08-08 NOTE — Unmapped (Signed)
Erica Henry 's HUMIRA(CF) PEN 40 mg/0.4 mL injection (adalimumab) shipment will be delayed as a result of a high copay.     I have reached out to the patient  via MyChart message and communicated the delay. We will wait for a call back from the patient to reschedule the delivery.  We have not confirmed the new delivery date.

## 2023-08-09 ENCOUNTER — Ambulatory Visit: Admit: 2023-08-09 | Payer: PRIVATE HEALTH INSURANCE | Attending: Gastroenterology | Primary: Gastroenterology

## 2023-08-10 NOTE — Unmapped (Signed)
Watt Climes Chisholm 's HUMIRA(CF) PEN 40 mg/0.4 mL injection (adalimumab) shipment will be canceled as a result of a high copay.     I have reached out to the patient  at [phone]  and left a voicemail message.  We will not reschedule the medication and have removed this/these medication(s) from the work request.  We have canceled this work request.

## 2023-09-07 NOTE — Unmapped (Signed)
Patient reports copay issue for Humira which has led to 2 missed doses.  Contacted [facility] and patient has maximizer plan and needs new debit card from [employer] to resolve.  She was instructed to call [phone] to resolve. Added to complete pro to assist her with the process. Asked her to follow up with me asap when she has completed call. Once card in hand she can call [facility] to order.

## 2023-09-17 NOTE — Unmapped (Signed)
Patient was notified of operational disruptions. Patient opted to: {Blank:[id number]::schedule their refill with understanding of potential delay until 10/1 or later.,transfer to another pharmacy. This was facilitated by pharmacy staff     [facility] Refill Coordination Note    Erica Henry, DOB: 2001-01-21  Phone: There are no phone numbers on file.      All above HIPAA information was verified with patient.         09/15/2023     8:14 PM   Specialty Rx Medication Refill Questionnaire   Which Medications would you like refilled and shipped? Humira   Please list all current allergies: N/a   Have you missed any doses in the last 30 days? Yes   If Yes, how many doses have you missed ? 3-5   Have you had any changes to your medication(s) since your last refill? No   How many days remaining of each medication do you have at home? 0   If receiving an injectable medication, next injection date is 09/28/2023   Have you experienced any side effects in the last 30 days? No   Please enter the full address (street address, city, state, zip code) where you would like your medication(s) to be delivered to. [address], [place]   Please specify on which day you would like your medication(s) to arrive. Note: if you need your medication(s) within 3 days, please call the pharmacy to schedule your order at [phone]  09/19/2023   Has your insurance changed since your last refill? No   Would you like a pharmacist to call you to discuss your medication(s)? No   Do you require a signature for your package? (Note: if we are billing Medicare Part B or your order contains a controlled substance, we will require a signature) No         Completed refill call assessment today to schedule patient's medication shipment from the [facility] [phone]).  All relevant notes have been reviewed.       Confirmed patient received a Conservation officer, historic buildings and a Surveyor, mining with first shipment. The patient will receive a drug information handout for each medication shipped and additional FDA Medication Guides as required.         REFERRAL TO PHARMACIST     Referral to the pharmacist: Not needed      [facility]     Shipping address confirmed in Epic.     Delivery Scheduled: Yes, Expected medication delivery date: 09/19/23.     Medication will be delivered via UPS to the prescription address in Epic WAM.    [name]   [facility] Specialty Technician

## 2023-09-18 MED FILL — HUMIRA PEN CITRATE FREE 40 MG/0.4 ML: SUBCUTANEOUS | 28 days supply | Qty: 2 | Fill #4

## 2023-10-15 NOTE — Unmapped (Signed)
[facility] Refill Coordination Note    Specialty Medication(s) to be Shipped:   Inflammatory Disorders: Humira    Other medication(s) to be shipped: No additional medications requested for fill at this time     Erica Henry, DOB: 2001-10-21  Phone: There are no phone numbers on file.      All above HIPAA information was verified with patient.     Was a Nurse, learning disability used for this call? No    Completed refill call assessment today to schedule patient's medication shipment from the [facility]  [phone]).  All relevant notes have been reviewed.     Specialty medication(s) and dose(s) confirmed: Regimen is correct and unchanged.   Changes to medications: Zeola reports no changes at this time.  Changes to insurance: No  New side effects reported not previously addressed with a pharmacist or physician: None reported  Questions for the pharmacist: No    Confirmed patient received a Conservation officer, historic buildings and a Surveyor, mining with first shipment. The patient will receive a drug information handout for each medication shipped and additional FDA Medication Guides as required.       DISEASE/MEDICATION-SPECIFIC INFORMATION        For patients on injectable medications: Patient currently has 0 doses left.  Next injection is scheduled for 11/8.    SPECIALTY MEDICATION ADHERENCE     Medication Adherence    Patient reported X missed doses in the last month: 0  Specialty Medication: HUMIRA(CF) PEN 40 mg/0.4 mL  Patient is on additional specialty medications: No              Were doses missed due to medication being on hold? No    Humira 40/0.4 mg/ml: 0 doses of medicine on hand        REFERRAL TO PHARMACIST     Referral to the pharmacist: Not needed      [facility]     Shipping address confirmed in Epic.       Delivery Scheduled: Yes, Expected medication delivery date: 10/18/23.     Medication will be delivered via UPS to the prescription address in Epic WAM.    [name]   [facility]  Specialty Technician

## 2023-10-17 MED FILL — HUMIRA PEN CITRATE FREE 40 MG/0.4 ML: SUBCUTANEOUS | 28 days supply | Qty: 2 | Fill #5

## 2023-11-08 DIAGNOSIS — K50919 Crohn's disease, unspecified, with unspecified complications: Principal | ICD-10-CM

## 2023-11-08 MED ORDER — HUMIRA PEN CITRATE FREE 40 MG/0.4 ML
SUBCUTANEOUS | 0 refills | 84 days | Status: CP
Start: 2023-11-08 — End: ?
  Filled 2023-11-13: qty 2, 28d supply, fill #0

## 2023-11-08 NOTE — Unmapped (Signed)
[facility] Refill Coordination Note    Erica Henry, DOB: [birth year]  Phone: There are no phone numbers on file.      All above HIPAA information was verified with patient.         11/08/2023    12:37 PM   Specialty Rx Medication Refill Questionnaire   Which Medications would you like refilled and shipped? Humira   Please list all current allergies: N/A   Have you missed any doses in the last 30 days? No   Have you had any changes to your medication(s) since your last refill? No   How many days remaining of each medication do you have at home? 1   If receiving an injectable medication, next injection date is 11/09/2023   Have you experienced any side effects in the last 30 days? No   Please enter the full address (street address, city, state, zip code) where you would like your medication(s) to be delivered to. [address], [place], [address]   Please specify on which day you would like your medication(s) to arrive. Note: if you need your medication(s) within 3 days, please call the pharmacy to schedule your order at [phone]  11/14/2023   Has your insurance changed since your last refill? No   Would you like a pharmacist to call you to discuss your medication(s)? No   Do you require a signature for your package? (Note: if we are billing Medicare Part B or your order contains a controlled substance, we will require a signature) No         Completed refill call assessment today to schedule patient's medication shipment from the [facility] [phone]).  All relevant notes have been reviewed.       Confirmed patient received a Conservation officer, historic buildings and a Surveyor, mining with first shipment. The patient will receive a drug information handout for each medication shipped and additional FDA Medication Guides as required.         REFERRAL TO PHARMACIST     Referral to the pharmacist: Not needed      [facility]     Shipping address confirmed in Epic.     Delivery Scheduled: Yes, Expected medication delivery date: 11/14/23.     Medication will be delivered via UPS to the prescription address in Epic WAM.    [name]   [facility] Specialty Technician

## 2023-11-08 NOTE — Unmapped (Signed)
Encounter for refill request:  Last clinic visit: 03/08/2023    Lab Results   Component Value Date    WBC 7.2 03/08/2023    RBC 5.28 (H) 03/08/2023    HGB 11.9 03/08/2023    HCT 37.8 03/08/2023    PLT 385 03/08/2023    ALT 8 (L) 03/08/2023    AST 17 03/08/2023    ALKPHOS 63 03/08/2023    CRP <4.0 03/08/2023    CREATININE 0.84 03/08/2023       humira refills authorized x  1. Follow up appt needed with Dr. Gwenith Spitz, patient will be contacted by [facility] GI central scheduling to schedule

## 2023-12-10 NOTE — Unmapped (Signed)
[facility] Specialty and Home Delivery Pharmacy Refill Coordination Note    Erica Henry, DOB: 05/01/2001  Phone: There are no phone numbers on file.      All above HIPAA information was verified with patient.         12/07/2023     3:51 PM   Specialty Rx Medication Refill Questionnaire   Which Medications would you like refilled and shipped? Humira   Please list all current allergies: N/a   Have you missed any doses in the last 30 days? No   Have you had any changes to your medication(s) since your last refill? No   How many days remaining of each medication do you have at home? 1   If receiving an injectable medication, next injection date is 12/07/2023   Have you experienced any side effects in the last 30 days? No   Please enter the full address (street address, city, state, zip code) where you would like your medication(s) to be delivered to. [address], [place], [place] [address]   Please specify on which day you would like your medication(s) to arrive. Note: if you need your medication(s) within 3 days, please call the pharmacy to schedule your order at [phone]  12/13/2023   Has your insurance changed since your last refill? No   Would you like a pharmacist to call you to discuss your medication(s)? No   Do you require a signature for your package? (Note: if we are billing Medicare Part B or your order contains a controlled substance, we will require a signature) No         Completed refill call assessment today to schedule patient's medication shipment from the [facility] Specialty and Home Delivery Pharmacy [phone]).  All relevant notes have been reviewed.       Confirmed patient received a Conservation officer, historic buildings and a Surveyor, mining with first shipment. The patient will receive a drug information handout for each medication shipped and additional FDA Medication Guides as required.         REFERRAL TO PHARMACIST     Referral to the pharmacist: Not needed      [facility]     Shipping address confirmed in Epic.     Delivery Scheduled: Yes, Expected medication delivery date: 12/14/23.     Medication will be delivered via UPS to the prescription address in Epic WAM.    [name]   [facility] Specialty and Home Delivery Pharmacy Specialty Technician

## 2023-12-13 MED FILL — HUMIRA PEN CITRATE FREE 40 MG/0.4 ML: SUBCUTANEOUS | 28 days supply | Qty: 2 | Fill #1

## 2024-01-08 NOTE — Unmapped (Signed)
[facility] Specialty and Home Delivery Pharmacy Refill Coordination Note    Erica Henry, DOB: 2001/05/14  Phone: There are no phone numbers on file.      All above HIPAA information was verified with patient.         01/04/2024     7:03 PM   Specialty Rx Medication Refill Questionnaire   Which Medications would you like refilled and shipped? Humira   Please list all current allergies: N/a   Have you missed any doses in the last 30 days? No   Have you had any changes to your medication(s) since your last refill? No   How many days remaining of each medication do you have at home? 1   If receiving an injectable medication, next injection date is 01/18/2024   Have you experienced any side effects in the last 30 days? No   Please enter the full address (street address, city, state, zip code) where you would like your medication(s) to be delivered to. [address] [place] [address]   Please specify on which day you would like your medication(s) to arrive. Note: if you need your medication(s) within 3 days, please call the pharmacy to schedule your order at [phone]  01/09/2024   Has your insurance changed since your last refill? No   Would you like a pharmacist to call you to discuss your medication(s)? No   Do you require a signature for your package? (Note: if we are billing Medicare Part B or your order contains a controlled substance, we will require a signature) No         Completed refill call assessment today to schedule patient's medication shipment from the [facility] Specialty and Home Delivery Pharmacy [phone]).  All relevant notes have been reviewed.       Confirmed patient received a Conservation officer, historic buildings and a Surveyor, mining with first shipment. The patient will receive a drug information handout for each medication shipped and additional FDA Medication Guides as required.         REFERRAL TO PHARMACIST     Referral to the pharmacist: Not needed      [facility]     Shipping address confirmed in Epic.     Delivery Scheduled: Yes, Expected medication delivery date: 01/10/24.     Medication will be delivered via UPS to the prescription address in Epic WAM.    [name]   [facility] Specialty and Home Delivery Pharmacy Specialty Technician

## 2024-01-09 NOTE — Unmapped (Signed)
Erica Henry 's HUMIRA(CF) PEN 40 mg/0.4 mL injection (adalimumab) shipment will be delayed as a result of a high copay.     I have reached out to the patient  via MyChart message and communicated the delay. We will wait for a call back from the patient to reschedule the delivery.  We have not confirmed the new delivery date.

## 2024-01-14 MED FILL — HUMIRA PEN CITRATE FREE 40 MG/0.4 ML: SUBCUTANEOUS | 28 days supply | Qty: 2 | Fill #2

## 2024-01-14 NOTE — Unmapped (Signed)
Watt Climes Yellin 's HUMIRA(CF) PEN 40 mg/0.4 mL injection (adalimumab) shipment will be rescheduled as a result of copay is now approved by patient/caregiver.      I have spoken with the patient  at [phone]  and communicated the delivery change. We will reschedule the medication for the delivery date that the patient agreed upon.  We have confirmed the delivery date as 01/15/24

## 2024-02-05 DIAGNOSIS — K50919 Crohn's disease, unspecified, with unspecified complications: Principal | ICD-10-CM

## 2024-02-05 MED ORDER — HUMIRA PEN CITRATE FREE 40 MG/0.4 ML
SUBCUTANEOUS | 0 refills | 28.00 days | Status: CP
Start: 2024-02-05 — End: ?
  Filled 2024-02-12: qty 2, 28d supply, fill #0

## 2024-02-05 NOTE — Unmapped (Signed)
 Patient is requesting the following refill  Requested Prescriptions     Pending Prescriptions Disp Refills    HUMIRA PEN CITRATE FREE 40 MG/0.4 ML 6 each 0     Sig: Inject the contents of 1 pen (40 mg total) under the skin every fourteen (14) days.       Recent Visits  Date Type Provider Dept   03/08/23 Office Visit Herfarth, Philippa Chester, MD [facility]   Showing recent visits within past 365 days and meeting all other requirements  Future Appointments  No visits were found meeting these conditions.  Showing future appointments within next 365 days and meeting all other requirements           Encounter for refill request:    Colonoscopy: 06/2023    Lab Results   Component Value Date    WBC 7.2 03/08/2023    RBC 5.28 (H) 03/08/2023    HGB 11.9 03/08/2023    HCT 37.8 03/08/2023    PLT 385 03/08/2023    ALT 8 (L) 03/08/2023    AST 17 03/08/2023    ALKPHOS 63 03/08/2023    CRP <4.0 03/08/2023    CREATININE 0.84 03/08/2023       refills authorized x  1. Follow up appt needed with Dr. Gwenith Spitz, patient will be contacted by [facility] GI central scheduling to schedule.   Labs due, reminder sent, Orders placed electronically to be performed at [facility].

## 2024-02-06 NOTE — Unmapped (Signed)
 [facility] Specialty and Home Delivery Pharmacy Refill Coordination Note    Erica Henry, DOB: [birth year]  Phone: There are no phone numbers on file.      All above HIPAA information was verified with patient.         02/05/2024     9:40 AM   Specialty Rx Medication Refill Questionnaire   Which Medications would you like refilled and shipped? Humira   Please list all current allergies: N/A   Have you missed any doses in the last 30 days? No   Have you had any changes to your medication(s) since your last refill? No   How many days remaining of each medication do you have at home? 0   If receiving an injectable medication, next injection date is 02/15/2024   Have you experienced any side effects in the last 30 days? No   Please enter the full address (street address, city, state, zip code) where you would like your medication(s) to be delivered to. [address], [place], [place] [address]   Please specify on which day you would like your medication(s) to arrive. Note: if you need your medication(s) within 3 days, please call the pharmacy to schedule your order at [phone]  02/13/2024   Has your insurance changed since your last refill? No   Would you like a pharmacist to call you to discuss your medication(s)? No   Do you require a signature for your package? (Note: if we are billing Medicare Part B or your order contains a controlled substance, we will require a signature) No         Completed refill call assessment today to schedule patient's medication shipment from the [facility] Specialty and Home Delivery Pharmacy [phone]).  All relevant notes have been reviewed.       Confirmed patient received a Conservation officer, historic buildings and a Surveyor, mining with first shipment. The patient will receive a drug information handout for each medication shipped and additional FDA Medication Guides as required.         REFERRAL TO PHARMACIST     Referral to the pharmacist: Not needed      [facility]     Shipping address confirmed in Epic.     Delivery Scheduled: Yes, Expected medication delivery date: 02/13/24.     Medication will be delivered via UPS to the prescription address in Epic WAM.    [name]   [facility] Specialty and Home Delivery Pharmacy Specialty Technician

## 2024-03-07 DIAGNOSIS — K50919 Crohn's disease, unspecified, with unspecified complications: Principal | ICD-10-CM

## 2024-03-07 MED ORDER — HUMIRA PEN CITRATE FREE 40 MG/0.4 ML
SUBCUTANEOUS | 0 refills | 28.00 days
Start: 2024-03-07 — End: ?

## 2024-03-10 MED ORDER — HUMIRA PEN CITRATE FREE 40 MG/0.4 ML
SUBCUTANEOUS | 2 refills | 28 days | Status: CP
Start: 2024-03-10 — End: ?
  Filled 2024-03-11: qty 2, 28d supply, fill #0

## 2024-03-10 NOTE — Unmapped (Signed)
 [facility] Specialty and Home Delivery Pharmacy Refill Coordination Note    Erica Henry, DOB: 01/30/2001  Phone: There are no phone numbers on file.      All above HIPAA information was verified with patient.         03/08/2024     8:17 PM   Specialty Rx Medication Refill Questionnaire   Which Medications would you like refilled and shipped? Humira   Please list all current allergies: N/A   Have you missed any doses in the last 30 days? No   Have you had any changes to your medication(s) since your last refill? No   How many days remaining of each medication do you have at home? 0   If receiving an injectable medication, next injection date is 03/14/2024   Have you experienced any side effects in the last 30 days? No   Please enter the full address (street address, city, state, zip code) where you would like your medication(s) to be delivered to. [address], [place], [place] [address]   Please specify on which day you would like your medication(s) to arrive. Note: if you need your medication(s) within 3 days, please call the pharmacy to schedule your order at [phone]  03/12/2024   Has your insurance changed since your last refill? No   Would you like a pharmacist to call you to discuss your medication(s)? No   Do you require a signature for your package? (Note: if we are billing Medicare Part B or your order contains a controlled substance, we will require a signature) No   I have been provided my out of pocket cost for my medication and approve the pharmacy to charge the amount to my credit card on file. Yes         Completed refill call assessment today to schedule patient's medication shipment from the [facility] and Home Delivery Pharmacy [phone]).  All relevant notes have been reviewed.       Confirmed patient received a Conservation officer, historic buildings and a Surveyor, mining with first shipment. The patient will receive a drug information handout for each medication shipped and additional FDA Medication Guides as required.         REFERRAL TO PHARMACIST     Referral to the pharmacist: Not needed      [facility]     Shipping address confirmed in Epic.     Delivery Scheduled: Yes, Expected medication delivery date: 03/12/24.     Medication will be delivered via UPS to the prescription address in Epic WAM.    [name]   [facility] Specialty and Home Delivery Pharmacy Specialty Technician

## 2024-03-10 NOTE — Unmapped (Signed)
 Patient is requesting the following refill  Requested Prescriptions     Pending Prescriptions Disp Refills    HUMIRA PEN CITRATE FREE 40 MG/0.4 ML 2 each 0     Sig: Inject the contents of 1 pen (40 mg total) under the skin every fourteen (14) days.       Recent Visits  No visits were found meeting these conditions.  Showing recent visits within past 365 days and meeting all other requirements  Future Appointments  Date Type Provider Dept   04/17/24 Appointment Herfarth, Philippa Chester, MD [facility]   Showing future appointments within next 365 days and meeting all other requirements           Encounter for refill request:    Colonoscopy:   Appointments which have been scheduled for you      Apr 17, 2024 11:30 AM  (Arrive by 11:15 AM)  RETURN IBD with Modena Nunnery, MD  [facility] MEDICINE [place] [facility]) [address]  [facility] [address]  [place] [address]  [phone]             Lab Results   Component Value Date    WBC 7.2 03/08/2023    RBC 5.28 (H) 03/08/2023    HGB 11.9 03/08/2023    HCT 37.8 03/08/2023    PLT 385 03/08/2023    ALT 8 (L) 03/08/2023    AST 17 03/08/2023    ALKPHOS 63 03/08/2023    CRP <4.0 03/08/2023    CREATININE 0.84 03/08/2023       refills authorized x  

## 2024-03-12 DIAGNOSIS — K50919 Crohn's disease, unspecified, with unspecified complications: Principal | ICD-10-CM

## 2024-03-28 NOTE — Unmapped (Signed)
 I reviewed this patient case and all documentation provided by the learner and was readily available for consultation during their interaction with the patient.  I agree with the assessment and plan listed below.    Arlyce Berger, PharmD   [facility] Specialty and Home Delivery Pharmacy Specialty Pharmacist      [facility] Specialty and Home Delivery Pharmacy Clinical Assessment & Refill Coordination Note    Erica Henry, DOB: 10/08/2001  Phone: There are no phone numbers on file.    All above HIPAA information was verified with patient.     Was a Nurse, learning disability used for this call? No    Specialty Medication(s):   Inflammatory Disorders: Humira      Current Outpatient Medications   Medication Sig Dispense Refill    cholestyramine -aspartame (CHOLESTYRAMINE  LIGHT) 4 gram PwPk Take 1 packet by mouth Two (2) times a day. 60 packet 3    empty container Misc Use as directed 1 each 3    HUMIRA  PEN CITRATE FREE 40 MG/0.4 ML Inject the contents of 1 pen (40 mg total) under the skin every fourteen (14) days. 2 each 2    simethicone  (MYLICON) 125 MG chewable tablet Take 2 tablets (250 mg) at 5:00 pm the day before your procedure. Take remaining 2 tablets (250 mg) at least 4 hours before your procedure time. (Patient not taking: Reported on 03/08/2023) 4 tablet 0     No current facility-administered medications for this visit.        Changes to medications: Erica Henry reports no changes at this time.    Medication list has been reviewed and updated in Epic: Yes    No Known Allergies    Changes to allergies: No    Allergies have been reviewed and updated in Epic: Yes    SPECIALTY MEDICATION ADHERENCE     Humira  40/0.4 mg/ml: 1 doses of medicine on hand   Medication Adherence    Patient reported X missed doses in the last month: 0  Specialty Medication: Humira  40mg /0.3ml  Patient is on additional specialty medications: No  Informant: patient        Specialty medication(s) dose(s) confirmed: Regimen is correct and unchanged.     Are there any concerns with adherence? No    Adherence counseling provided? Not needed    CLINICAL MANAGEMENT AND INTERVENTION      Clinical Benefit Assessment:    Do you feel the medicine is effective or helping your condition? Yes    Clinical Benefit counseling provided? Not needed    Adverse Effects Assessment:    Are you experiencing any side effects? No    Are you experiencing difficulty administering your medicine? No    Quality of Life Assessment:    Quality of Life    Rheumatology  Oncology  Dermatology  Cystic Fibrosis          How many days over the past month did your Crohn's  keep you from your normal activities? For example, brushing your teeth or getting up in the morning. 0    Have you discussed this with your provider? Not needed    Acute Infection Status:    Acute infections noted within Epic:  No active infections    Patient reported infection: None    Therapy Appropriateness:    Is therapy appropriate based on current medication list, adverse reactions, adherence, clinical benefit and progress toward achieving therapeutic goals? Yes, therapy is appropriate and should be continued     Clinical Intervention:  Was an intervention completed as part of this clinical assessment? No    DISEASE/MEDICATION-SPECIFIC INFORMATION      For patients on injectable medications: Patient currently has 1 doses left.  Next injection is scheduled for 03/28/24.    Chronic Inflammatory Diseases: Have you experienced any flares in the last month? No  Has this been reported to your provider? Not applicable    PATIENT SPECIFIC NEEDS     Does the patient have any physical, cognitive, or cultural barriers? No    Is the patient high risk? No    Does the patient require physician intervention or other additional services (i.e., nutrition, smoking cessation, social work)? No    Does the patient have an additional or emergency contact listed in their chart? Yes    SOCIAL DETERMINANTS OF HEALTH     At the [facility], we have learned that life circumstances - like trouble affording food, housing, utilities, or transportation can affect the health of many of our patients.   That is why we wanted to ask: are you currently experiencing any life circumstances that are negatively impacting your health and/or quality of life? Patient declined to answer    Social Drivers of Health     Food Insecurity: No Food Insecurity (09/22/2022)    Received from [facility], [facility]    Hunger Vital Sign     Worried About Running Out of Food in the Last Year: Never true     Ran Out of Food in the Last Year: Never true   Tobacco Use: Low Risk  (06/29/2023)    Patient History     Smoking Tobacco Use: Never     Smokeless Tobacco Use: Never     Passive Exposure: Not on file   Transportation Needs: No Transportation Needs (09/26/2022)    Received from [facility], [facility]    PRAPARE - Transportation     Lack of Transportation (Medical): No     Lack of Transportation (Non-Medical): No   Alcohol Use: Heavy Drinker (09/26/2022)    Received from [facility], [facility]    AUDIT-C     Frequency of Alcohol Consumption: 2-4 times a month     Average Number of Drinks: 3 or 4     Frequency of Binge Drinking: Never   Housing: Low Risk  (09/26/2022)    Received from [facility], [facility]    Housing Stability Vital Sign     Unable to Pay for Housing in the Last Year: No     Number of Places Lived in the Last Year: 1     In the last 12 months, was there a time when you did not have a steady place to sleep or slept in a shelter (including now)?: No   Physical Activity: Insufficiently Active (09/26/2022)    Received from [facility], [facility]    Exercise Vital Sign     Days of Exercise per Week: 1 day     Minutes of Exercise per Session: 30 min   Utilities: Not on file   Stress: No Stress Concern Present (09/26/2022)    Received from [facility], [facility] of Occupational Health - Occupational Stress Questionnaire     Feeling of Stress : Only a little   Interpersonal Safety: Not on file   Substance Use: Not on file (10/24/2023)   Intimate Partner Violence: Not At Risk (09/26/2022)    Received from [facility], [facility]  Humiliation, Afraid, Rape, and Kick questionnaire     Fear of Current or Ex-Partner: No     Emotionally Abused: No     Physically Abused: No     Sexually Abused: No   Social Connections: Moderately Integrated (09/26/2022)    [facility]    Social Connection and Isolation Panel [NHANES]     Frequency of Communication with Friends and Family: More than three times a week     Frequency of Social Gatherings with Friends and Family: Twice a week     Attends Religious Services: More than 4 times per year     Active Member of [employer] or Organizations: Yes     Attends Banker Meetings: 1 to 4 times per year     Marital Status: Never married   Physicist, medical Strain: Low Risk  (09/26/2022)    Received from [employer], [facility]    Overall Financial Resource Strain (CARDIA)     Difficulty of Paying Living Expenses: Not very hard   Depression: Not at risk (09/22/2022)    Received from [facility], [facility]    Depression     PHQ-2 Total Score: 0   Internet Connectivity: Not on file   Health Literacy: Not on file       Would you be willing to receive help with any of the needs that you have identified today? Not applicable       SHIPPING     Specialty Medication(s) to be Shipped:   Inflammatory Disorders: Humira     Other medication(s) to be shipped: No additional medications requested for fill at this time     Changes to insurance: No    Cost and Payment: Patient has a $0 copay, payment information is not required.    Delivery Scheduled: Yes, Expected medication delivery date: 4/22.     Medication will be delivered via UPS to the confirmed prescription address in [facility].    The patient will receive a drug information handout for each medication shipped and additional FDA Medication Guides as required.  Verified that patient has previously received a Conservation officer, historic buildings and a Surveyor, mining.    The patient or caregiver noted above participated in the development of this care plan and knows that they can request review of or adjustments to the care plan at any time.      All of the patient's questions and concerns have been addressed.    Pearson Bounds, PharmD   [facility] Specialty and Home Delivery Pharmacy Specialty Pharmacist

## 2024-04-03 ENCOUNTER — Ambulatory Visit
Admit: 2024-04-03 | Discharge: 2024-04-04 | Payer: BLUE CROSS/BLUE SHIELD | Attending: Gastroenterology | Primary: Gastroenterology

## 2024-04-03 DIAGNOSIS — E538 Deficiency of other specified B group vitamins: Principal | ICD-10-CM

## 2024-04-03 DIAGNOSIS — K50919 Crohn's disease, unspecified, with unspecified complications: Principal | ICD-10-CM

## 2024-04-03 DIAGNOSIS — D84821 Immunosuppression due to drug therapy: Principal | ICD-10-CM

## 2024-04-03 DIAGNOSIS — E611 Iron deficiency: Principal | ICD-10-CM

## 2024-04-03 DIAGNOSIS — Z79899 Other long term (current) drug therapy: Principal | ICD-10-CM

## 2024-04-03 LAB — C-REACTIVE PROTEIN: C-REACTIVE PROTEIN: <5 mg/L (ref <=10.0)

## 2024-04-03 LAB — COMPREHENSIVE METABOLIC PANEL
ALBUMIN: 4 g/dL (ref 3.4–5.0)
ALKALINE PHOSPHATASE: 51 U/L (ref 46–116)
ALT (SGPT): 12 U/L (ref 10–49)
ANION GAP: 11 mmol/L (ref 5–14)
AST (SGOT): 17 U/L (ref <=34)
BILIRUBIN TOTAL: 0.8 mg/dL (ref 0.3–1.2)
BLOOD UREA NITROGEN: 8 mg/dL — ABNORMAL LOW (ref 9–23)
BUN / CREAT RATIO: 11
CALCIUM: 9.2 mg/dL (ref 8.7–10.4)
CHLORIDE: 105 mmol/L (ref 98–107)
CO2: 24.9 mmol/L (ref 20.0–31.0)
CREATININE: 0.74 mg/dL (ref 0.55–1.02)
EGFR CKD-EPI (2021) FEMALE: >90 mL/min/1.73m2 (ref >=60)
GLUCOSE RANDOM: 88 mg/dL (ref 70–179)
POTASSIUM: 3.6 mmol/L (ref 3.4–4.8)
PROTEIN TOTAL: 7.8 g/dL (ref 5.7–8.2)
SODIUM: 141 mmol/L (ref 135–145)

## 2024-04-03 LAB — FERRITIN: FERRITIN: 13.5 ng/mL (ref 7.3–270.7)

## 2024-04-03 LAB — CBC W/ AUTO DIFF
BASOPHILS ABSOLUTE COUNT: 0 10*9/L (ref 0.0–0.1)
BASOPHILS RELATIVE PERCENT: 0.5 %
EOSINOPHILS ABSOLUTE COUNT: 0.1 10*9/L (ref 0.0–0.5)
EOSINOPHILS RELATIVE PERCENT: 1.2 %
HEMATOCRIT: 35.9 % (ref 34.0–44.0)
HEMOGLOBIN: 11.3 g/dL (ref 11.3–14.9)
LYMPHOCYTES ABSOLUTE COUNT: 1.5 10*9/L (ref 1.1–3.6)
LYMPHOCYTES RELATIVE PERCENT: 22.4 %
MEAN CORPUSCULAR HEMOGLOBIN CONC: 31.4 g/dL — ABNORMAL LOW (ref 32.0–36.0)
MEAN CORPUSCULAR HEMOGLOBIN: 22 pg — ABNORMAL LOW (ref 25.9–32.4)
MEAN CORPUSCULAR VOLUME: 70.1 fL — ABNORMAL LOW (ref 77.6–95.7)
MEAN PLATELET VOLUME: 8.6 fL (ref 6.8–10.7)
MONOCYTES ABSOLUTE COUNT: 0.5 10*9/L (ref 0.3–0.8)
MONOCYTES RELATIVE PERCENT: 6.6 %
NEUTROPHILS ABSOLUTE COUNT: 4.7 10*9/L (ref 1.8–7.8)
NEUTROPHILS RELATIVE PERCENT: 69.3 %
PLATELET COUNT: 317 10*9/L (ref 150–450)
RED BLOOD CELL COUNT: 5.13 10*12/L (ref 3.95–5.13)
RED CELL DISTRIBUTION WIDTH: 15 % (ref 12.2–15.2)
WBC ADJUSTED: 6.8 10*9/L (ref 3.6–11.2)

## 2024-04-03 LAB — IRON PANEL
IRON SATURATION: 9 % — ABNORMAL LOW (ref 20–55)
IRON: 26 ug/dL — ABNORMAL LOW (ref 50–170)
TOTAL IRON BINDING CAPACITY: 303 ug/dL (ref 250–425)

## 2024-04-03 LAB — FOLATE: FOLATE: 5.2 ng/mL — ABNORMAL LOW (ref >=5.4)

## 2024-04-03 LAB — VITAMIN B12: VITAMIN B-12: 501 pg/mL (ref 211–911)

## 2024-04-03 MED ORDER — LOPERAMIDE 2 MG CAPSULE
ORAL_CAPSULE | Freq: Two times a day (BID) | ORAL | 6 refills | 45.00 days | Status: CP | PRN
Start: 2024-04-03 — End: 2025-04-03

## 2024-04-03 NOTE — Unmapped (Addendum)
 Continue Humira  every second week    Colonoscopy Mid 2026    I refer you to nutritionist to see if one can optimize calorie intake    Labs today

## 2024-04-03 NOTE — Unmapped (Signed)
 [facility]   [facility]  FOLLOW-UP CONSULT NOTE             Referring physician:   Stem, Artice Last, MD  [address]  [place],  [place] [address]    Today, I saw Erica Henry for initial consultation in the [facility] regarding Crohn's Henry.          HPI: Erica Henry is a 23 y.o.  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     Patient had lapse in insurance and is off Humira  since [facility] January. Now back with insurance . 1-2 soft bowel movements daily. 2 imodium  capsules daily. Stable weight, no extraintestinal manifestations of CD.     History of Present Illness  The patient, with a history of bowel resection, presents for follow-up. She reports maintaining her current weight, but expresses a desire to gain more (in fact she lost 2 pounds compared to last year). She has 2-3 bowel movements per day. She has tried Colestid  in the past, which helped reduce bowel movements, and is currently taking Imodium , which also helps. She is open to trying Colestid  or cholestyramine  again. She is also on Humira  every two weeks f. She denies abdominal pain. She is not currently on B12, but levels will be checked today.        Erica Henry  General Well Being: 0 = Very well  Abdominal Pain:  0 = none  Number of Liquid Stools Per day: 2  Abdominal Mass:  0 = None  Number of Extraintestinal Manifestations:  0   1 point each for: 1. Arthritis/arthralgias, 2. Iritis/uveitis, 3. Active perianal dz, 4. Other active fistula, 5. Erythema nodosum or pyoderma, 6. Other    Total HBI Score (0-25):  2     Score:  Remission < 5;  Mild dz 5-7;  Moderate dz 8-16;  Severe > 16      Review of Systems: Positive as above. Otherwise, the balance of all systems is negative.     IBD HISTORY:     Henry phenotype: Penetrating Crohn's Henry    Year of Henry onset:  2020      Brief IBD Henry Course:    2020/early 2021: Had non-localized abdominal pain. Work-up in [place] . Had a colonoscopy in Nov 2020 and was told that it was normal (no records).  Sept 2021: Moved to [place]. Abdominal pain got worse. Was found to have abdominal abscesses- drained and was started on antibiotics.   Jan 2022: Admitted to [facility] with intra-abdominal abscess seen on CT.  Drain placed.  Underwent a colonoscopy that showed TI stricturing that was not traversed.  Based on these findings, she was determined to have penetrating Crohn's Henry. Underwent a laparoscopic ileocecectomy with end ileostomy.   Surgical specimen pathology was consistent with Crohn's Henry (chronic enteritis with transmural inflammation and numerous noncaseating granulomas).  Did not receive steroids or any IBD directed therapies during her hospital stay.  February 2022: Follow-up CT scan showed no new intra-abdominal collections.  Some thickening noted in the rectum and hepatic flexure.   May 2022: Underwent laparoscopic take down of ostomy site but given concern for health of R colon, underwent completion right hemicolectomy with hepatic flexure mobilization, side-to-side functional end-to-end ileocolic anastomosis with Dr. Ursula Gardner.  June 2022 start of adalimumab   12/2022 stop of Humira  by patient  due to loss of insurance  03/2023 restart Humira   06/2023 SES-CD =0  03/2024 remission      Last endoscopy:     Colonoscopy     06/2023   Patent end-to-side ileo-colonic anastomosis,                          characterized by healthy appearing mucosa.                         - The examined portion of the ileum was normal.                         - The entire examined colon is normal.                         - One 6 mm polyp in the sigmoid colon, removed with a                          cold snare. Resected and retrieved.                         Crohn's Henry in remission. (Rutgeerts i0, SES-CD=0)    A: Sigmoid colon, biopsy:  -Colonic mucosa with lymphoid aggregate and mild goblet cell hyperplasia.    01/2022     Patent end-to-end ileo-colonic anastomosis,                          characterized by ulceration, Rutgeert's i2a.                         - Simple Endoscopic Score for Crohn's Henry: 0,                          Crohn's Henry, in remission.                         - Pseudopolyps in the sigmoid colon.                         - No specimens collected.                         No significant recurrence of Crohn's Henry. Continue                          Humira .      Colonoscopy 12/31/2020  Impression:            - The entire examined colon is normal.                         - Stricture in the terminal ileum. Not traversed.                         - No specimens collected.    Last imaging:      CT A/P 01/24/2021 (post-op outpatient follow-up)    -Diffuse wall thickening of the colon extending to the rectum with mild stranding and edema predominantly about the hepatic flexure concerning for a component of active inflammatory Henry.  -  Interval decreased size of left lower quadrant peripherally enhancing collection/abscess.  -Redemonstrated additional peripheral enhancing cystic foci in the adnexa which may represent additional foci of collections versus ovarian follicle/hydrosalpinx.    CT A/P 01/12/2021 (post op, done due to leucocytosis and tachycardia)    --Interval ileocecectomy with right lower quadrant end ileostomy. Wall thickening/inflammatory change of the distal ileum at the ileostomy as well as of the colon at the hepatic flexure, new. Inflammatory changes of the rectosigmoid colon, improved since prior.     --Ill-defined hyperdense fluid in the right paracolic gutter, equivocal for hematoma versus small amount of extravasated enteric contrast, though no sites of bowel leak is identified. Trace hyperdense fluid versus peritoneal enhancement is seen extending anteriorly. If there are clinical concerns of bowel leak, fistulogram through the ileocecal ostomy could be performed.     --Peripherally enhancing fluid collection concerning for abscess in the deep posterior pelvis is decreased in size, now 4.3 x 1.9 cm. Interval removal of pelvic drains. Tract extends from this fluid collection towards the sigmoid colon, with fistula not excluded. Tract corresponding to site of prior drain placement also extends from this through the left posterior gluteal soft tissues.     --Additional fluid density structure in the left hemipelvis is mildly increased from prior. This could represent a left adnexal/ovarian cyst versus hydrosalpinx or residual fluid/phlegmon from previously drained fluid collection with interval catheter removal.     --Wall thickening/inflammatory change involving distal ileum at the ileostomy, hepatic flexure, and rectosigmoid colon, likely chronic inflammation.     --Small locules of air within the urinary bladder. No bladder fistula seen. Correlate for evidence of recent instrumentation.     --Postsurgical changes of the ventral abdominal wall. Tiny partially-organized fluid and air-containing collection at the umbilicus likely corresponding to site of prior port/trocar placement; findings favor seroma, though tiny developing superficial abscess not excluded. Correlate with exam findings.       CT A/P 12/29/2019 (at the time of admission)  -Left transgluteal intra-abdominal drain with tip terminating in the left lower pelvis. Small volume free fluid along the inferior aspect of the drain tip. Interval development of new rim-enhancing fluid collection in the posterior lower pelvis measuring 3.8 x 2.9 cm, concerning for new intraabdominal abscess and likely amenable to drainage.     -Long segmental wall thickening of the terminal ileum and sigmoid colon, compatible with known Crohn's.        Surgical specimen pathology 01/05/2021    A: Small bowel cecum, ileocecectomy  - Segment of terminal ileum and proximal colon with patchy severely active chronic enteritis with fissuring ulceration, perforation, and stricture, transmural inflammation, numerous noncaseating granulomas, and acute and chronic serositis with adhesions, consistent with clinical history of Crohn's Henry  - No CMV viral cytopathic effect, granuloma, or dysplasia identified  - Margins appear histologically viable and negative for dysplasia or malignancy with proximal margin demonstrating rare noncaseating granuloma  - Appendix with dense adhesions and acute and chronic serositis, secondary to separate associated fissuring ulceration  - Two lymph nodes, negative for malignancy, with rare noncaseating granuloma (0/2)  - Special stains for micro-organisms will be reported as an addendum    IBD health maintenance:  Influenza vaccine: No  Covid vaccine: No  Pneumonia vaccine: No  Hepatitis B:   TB testing: Yes- 12/29/2020  Chickenpox/Shingles history: Has not had chickenpox. Has not had Shingles vaccines.  Bone densitometry: -  PAP smear: -    Past Medical History:   Past medical  history:   Crohn's, 2021    Past surgical history:   Laparoscopic ileocecectomy, end ileostomy creation, 01/05/2021  Fracture tibia repair, 2014    Family history:   No colon cancer or IBD.    Social history:   Does not smoke.  Occasional alcohol.  Marijuana occasional. Last use was Sept.  Originally lived in [place] . Moved to [place] with her sister.   Lives in [place]. Lives with her sister.  Working intermittently at [employer] - about to start working more consistently.    Allergies:   No Known Allergies      Medications:     Current Outpatient Medications   Medication Sig Dispense Refill    cetirizine (ZYRTEC) 10 MG tablet Take 1 tablet (10 mg total) by mouth.      empty container Misc Use as directed 1 each 3    HUMIRA  PEN CITRATE FREE 40 MG/0.4 ML Inject the contents of 1 pen (40 mg total) under the skin every fourteen (14) days. 2 each 2    loperamide  (IMODIUM ) 2 mg capsule Take 2 capsules (4 mg total) by mouth two (2) times a day as needed for diarrhea. 180 capsule 6     No current facility-administered medications for this visit.               Physical Exam:   BP 110/71  - Pulse 79  - Temp 36.8 ??C (98.2 ??F) (Temporal)  - Ht 165.1 cm (5' 5)  - Wt 53.9 kg (118 lb 12.8 oz)  - BMI 19.77 kg/m??      BP Readings from Last 3 Encounters:   04/03/24 110/71   06/29/23 88/59   03/08/23 108/72      Wt Readings from Last 3 Encounters:   04/03/24 53.9 kg (118 lb 12.8 oz)   06/29/23 54.4 kg (120 lb)   03/08/23 56.3 kg (124 lb 3.2 oz)      BMI: Estimated body mass index is 19.77 kg/m?? as calculated from the following:    Height as of this encounter: 165.1 cm (5' 5).    Weight as of this encounter: 53.9 kg (118 lb 12.8 oz).    BSA: Estimated body surface area is 1.57 meters squared as calculated from the following:    Height as of this encounter: 165.1 cm (5' 5).    Weight as of this encounter: 53.9 kg (118 lb 12.8 oz).    GEN: no apparent distress, appears comfortable on exam  HEENT: PEERL, OP clear with no erythema/lesions/exudate, mucous membranes moist  NECK: Supple, no lymphadenopathy  LUNGS: CTAB, no wheezes, rales, or rhonchi  CV: S1/S2, RRR, no murmurs  ABD: Soft,non-tender, non-distended. Laparoscopic surgical incision sites c/d/i  Extremities: no cyanosis, clubbing or edema, normal gait  Psych: affect appropriate, A&O x3  SKIN: no visible lesions on face, neck, arms, abdomen      Labs    Office Visit on 04/03/2024   Component Date Value Ref Range Status    Sodium 04/03/2024 141  135 - 145 mmol/L Final    Potassium 04/03/2024 3.6  3.4 - 4.8 mmol/L Final    Chloride 04/03/2024 105  98 - 107 mmol/L Final    CO2 04/03/2024 24.9  20.0 - 31.0 mmol/L Final    Anion Gap 04/03/2024 11  5 - 14 mmol/L Final    BUN 04/03/2024 8 (L)  9 - 23 mg/dL Final    Creatinine 30/16/0109 0.74  0.55 - 1.02 mg/dL Final  BUN/Creatinine Ratio 04/03/2024 11   Final    eGFR CKD-EPI (2021) Female 04/03/2024 >90  >=60 mL/min/1.72m2 Final    Glucose 04/03/2024 88  70 - 179 mg/dL Final    Calcium 12/20/7251 9.2  8.7 - 10.4 mg/dL Final    Albumin 66/44/0347 4.0  3.4 - 5.0 g/dL Final    Total Protein 04/03/2024 7.8  5.7 - 8.2 g/dL Final    Total Bilirubin 04/03/2024 0.8  0.3 - 1.2 mg/dL Final    AST 42/59/5638 17  <=34 U/L Final    ALT 04/03/2024 12  10 - 49 U/L Final    Alkaline Phosphatase 04/03/2024 51  46 - 116 U/L Final    CRP 04/03/2024 <5.0  <=10.0 mg/L Final    Ferritin 04/03/2024 13.5  7.3 - 270.7 ng/mL Final    Iron 04/03/2024 26 (L)  50 - 170 ug/dL Final    TIBC 75/64/3329 303  250 - 425 ug/dL Final    Iron Saturation (%) 04/03/2024 9 (L)  20 - 55 % Final    Vitamin B-12 04/03/2024 501  211 - 911 pg/ml Final    Folate 04/03/2024 5.2 (L)  >=5.4 ng/mL Final    WBC 04/03/2024 6.8  3.6 - 11.2 10*9/L Final    RBC 04/03/2024 5.13  3.95 - 5.13 10*12/L Final    HGB 04/03/2024 11.3  11.3 - 14.9 g/dL Final    HCT 51/88/4166 35.9  34.0 - 44.0 % Final    MCV 04/03/2024 70.1 (L)  77.6 - 95.7 fL Final    MCH 04/03/2024 22.0 (L)  25.9 - 32.4 pg Final    MCHC 04/03/2024 31.4 (L)  32.0 - 36.0 g/dL Final    RDW 06/16/1600 15.0  12.2 - 15.2 % Final    MPV 04/03/2024 8.6  6.8 - 10.7 fL Final    Platelet 04/03/2024 317  150 - 450 10*9/L Final    Neutrophils % 04/03/2024 69.3  % Final    Lymphocytes % 04/03/2024 22.4  % Final    Monocytes % 04/03/2024 6.6  % Final    Eosinophils % 04/03/2024 1.2  % Final    Basophils % 04/03/2024 0.5  % Final    Absolute Neutrophils 04/03/2024 4.7  1.8 - 7.8 10*9/L Final    Absolute Lymphocytes 04/03/2024 1.5  1.1 - 3.6 10*9/L Final    Absolute Monocytes 04/03/2024 0.5  0.3 - 0.8 10*9/L Final    Absolute Eosinophils 04/03/2024 0.1  0.0 - 0.5 10*9/L Final    Absolute Basophils 04/03/2024 0.0  0.0 - 0.1 10*9/L Final    Microcytosis 04/03/2024 Moderate (A)  Not Present Final       \  Assessment & Plan:     Erica Henry is a 23 y.o. 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  She has undergone a few intra-abdominal abscess drainages (fall 2021-Jan 2022) and recently underwent a laparoscopic ileocecectomy with end ileostomy creation on 01/05/2021 at [facility] followed by a laparoscopic take down of ostomy site and completion right hemicolectomy with side-to-side functional end-to-end ileocolic anastomosis on 05/03/2021.       Penetrating ileal Crohn's Henry:   Patient started Humira  in June 2022 but lost insurance in 12/2022 and restarted in 03/2023. Crohn's Henry well-managed, no active inflammation on recent colonoscopy in 06/2023. Continues Humira  biweekly.  - Check Humira  levels today.  - Continue Humira  pen citrate free 40 mg subcutaneous  every 14 days.      Postoperative colonoscopy: Colonoscopy postoperatively in 06/2023 showed an Rutgeerts I 0  Next colonoscopy in around mid 2026.       TDM: Levels in September 2022 in good range.  We will continue to weekly Humira   No results found for: AADAL  Lab Results   Component Value Date    Adalimumab  12.5 06/29/2023    Adalimumab  15.5 09/01/2021       Bile acid enteropathy: Colestid  helped due to costs now on loperamide  which works as well, which we will continue    Nutritional management  Maintained weight, desires weight gain. Discussed dietary changes and nutritionist consultation.  - Refer to nutritionist for video consultation.  - Advise to consume more frequent, higher-calorie meals.      Labs: otherwise normal except below      Iron deficiency: Latent iron deficiency. Recommendation for oral iron and if not tolerated iron infusion.    Vitamin B12: In normal range; 501 (03/2023).  Folate low, 5.2, recommendation for prenatal vitamin.    Health maintenance:     Patient received Prevnar 20 06/2022.   Shingrix in 08/2022 and 03/2023.        Portions of this record have been created using Scientist, clinical (histocompatibility and immunogenetics). Dictation errors have been sought, but may not have been identified and corrected.      Plan        Patient Instructions   Continue Humira  every second week    Colonoscopy Mid 2026    I refer you to nutritionist to see if one can optimize calorie intake    Labs today

## 2024-04-07 LAB — VITAMIN D 25 HYDROXY: VITAMIN D, TOTAL (25OH): 8.8 ng/mL — ABNORMAL LOW (ref 20.0–80.0)

## 2024-04-07 LAB — ADALIMUMAB/ADALIMUMAB AB: ADALIMUMAB: 13.7 ug/mL

## 2024-04-07 MED FILL — HUMIRA PEN CITRATE FREE 40 MG/0.4 ML: SUBCUTANEOUS | 28 days supply | Qty: 2 | Fill #1

## 2024-04-13 ENCOUNTER — Other Ambulatory Visit (HOSPITAL_BASED_OUTPATIENT_CLINIC_OR_DEPARTMENT_OTHER): Payer: Self-pay

## 2024-04-13 MED ORDER — COLESTIPOL 1 GRAM TABLET
ORAL_TABLET | 11 refills | 0.00000 days
Start: 2024-04-13 — End: ?

## 2024-04-14 ENCOUNTER — Other Ambulatory Visit (HOSPITAL_BASED_OUTPATIENT_CLINIC_OR_DEPARTMENT_OTHER): Payer: Self-pay

## 2024-04-14 MED ORDER — COLESTIPOL 1 GRAM TABLET
ORAL_TABLET | ORAL | 5 refills | 0.00000 days | Status: CP
Start: 2024-04-14 — End: ?

## 2024-04-14 MED ORDER — COLESTIPOL HCL 1 G PO TABS
2.0000 g | ORAL_TABLET | Freq: Every morning | ORAL | 5 refills | Status: AC
  Filled 2024-04-14: qty 180, 90d supply, fill #0

## 2024-04-14 NOTE — Unmapped (Signed)
 Patient is requesting the following refill  Requested Prescriptions     Pending Prescriptions Disp Refills    colestipol (COLESTID) 1 gram tablet [Pharmacy Med Name: colestipol (COLESTID) 1 g tablet] 180 tablet 11     Sig: Take 2 tablets by mouth in the morning       Recent Visits  Date Type Provider Dept   04/03/24 Office Visit Herfarth, Caresse Chant, MD [facility]   Showing recent visits within past 365 days and meeting all other requirements  Future Appointments  No visits were found meeting these conditions.  Showing future appointments within next 365 days and meeting all other requirements           Encounter for refill request:    Colonoscopy:   Appointments which have been scheduled for you      Apr 24, 2024 8:45 AM  (Arrive by 8:30 AM)  NEW VIDEO DIRECT LINK with Jae Maya, RD/LDN  [facility] [place])  Arrive at: This is a Video Visit [address]  [facility] [address]  [place] [address]  [phone]   A direct link will be sent to you by your provider at the time of your video appointment. Please do NOT go to the clinic.     For your video visit, you will need a computer with a working camera, speaker and microphone, a smartphone, or a tablet with internet access.              Lab Results   Component Value Date    WBC 6.8 04/03/2024    RBC 5.13 04/03/2024    HGB 11.3 04/03/2024    HCT 35.9 04/03/2024    PLT 317 04/03/2024    ALT 12 04/03/2024    AST 17 04/03/2024    ALKPHOS 51 04/03/2024    CRP <5.0 04/03/2024    CREATININE 0.74 04/03/2024       refills authorized x  12 months

## 2024-04-24 ENCOUNTER — Encounter
Admit: 2024-04-24 | Discharge: 2024-04-25 | Payer: BLUE CROSS/BLUE SHIELD | Attending: Registered" | Primary: Registered"

## 2024-04-24 DIAGNOSIS — K50919 Crohn's disease, unspecified, with unspecified complications: Principal | ICD-10-CM

## 2024-04-24 NOTE — Unmapped (Signed)
 [facility] Outpatient Nutrition Services   Medical Nutrition Therapy Consultation       Visit Type:    Initial Assessment    Referral Reason:   Referring diagnosis: Crohn's disease with complication, unspecified gastrointestinal tract location; referral note: Patient has problems gaining weight, Crohn's diease in remission, perhaps oral intake can be optimized       Erica Henry is a 23 y.o. female seen for medical nutrition therapy. Her active problem list, medication list, allergies, lab results, endoscopy procedure notes, and imaging were reviewed.     Her interim medical history is significant for penetrating ileal Crohn's disease, characterized by a terminal ileal stricture and intra-abdominal abscesses, laparoscopic ileocecectomy with end ileostomy (2022), laparoscopic take down of ostomy site with right hemicolectomy and side-to-side functional end-to-end ileocolic anastomosis (2022), iron deficiency anemia, severe protein calorie malnutrition (noted 2022), unspecified thalassemia.      Anthropometrics   Height: 165.1 cm (5' 5)   Weight: 53.9 kg (118 lb 12.8 oz) (last documented weight 04/03/24)   Body mass index is 19.77 kg/m??.    Wt Readings from Last 5 Encounters:   04/03/24 53.9 kg (118 lb 12.8 oz)   06/29/23 54.4 kg (120 lb)   03/08/23 56.3 kg (124 lb 3.2 oz)   06/29/22 59 kg (130 lb)   02/06/22 59.9 kg (132 lb)        Usual body weight: patient reports that she used to weigh 135-140 lb (last weighed this around time of ileostomy surgeries per patient); she notes that lately she is scared to get on the scale, as she has a hard time putting on weight and has been weighing about 115-120 lb recently   Ideal Body Weight: 56.75 kg  Weight in (lb) to have BMI = 25: 149.9    Nutrition Risk Screening:   Food Insecurity: No Food Insecurity (09/22/2022)    Received from [facility]    Hunger Vital Sign     Worried About Running Out of Food in the Last Year: Never true     Ran Out of Food in the Last Year: Never true        Nutrition Focused Physical Exam:    Nutrition Evaluation  Overall Impressions: Unable to perform Nutrition-Focused Physical Exam at this time due to (comment) (video visit) (04/29/24 2140)  Nutrition Designation: Normal weight (BMI 18.50 - 24.99 kg/m2) (04/29/24 2140)     Malnutrition Screening:   Unable to complete Malnutrition Assessment at this time due to (comment) (NFPE limited by video visit) (04/29/24 2140)    Biochemical Data, Medical Tests and Procedures:  All pertinent labs and imaging reviewed by Jae Maya, RD/LDN at 9:53 PM 04/29/2024.    06/29/23- colonoscopy   Impression:     - Patent end-to-side ileo-colonic anastomosis,                          characterized by healthy appearing mucosa.                         - The examined portion of the ileum was normal.                         - The entire examined colon is normal.                         - One  6 mm polyp in the sigmoid colon, removed with a                          cold snare. Resected and retrieved.    06/29/23- surgical pathology exam  Diagnosis   A: Sigmoid colon, biopsy:  -Colonic mucosa with lymphoid aggregate and mild goblet cell hyperplasia.       04/03/24- folate 5.2 ng/ml, vitamin B12 WNL, 25OH vitamin D 8.8 ng/ml, iron 26 micrograms/dl, iron saturation 9%, ferritin WNL, CRP WNL, sodium WNL, potassium WNL, eGFR WNL, BG WNL    Medications and Vitamin/Mineral Supplementation:   All nutritionally pertinent medications reviewed on 04/29/2024.     Nutritionally pertinent medications include: colestipol, Humira, loperamide     She is taking nutrition supplements. Patient reports that she has started prenatal vitamin and iron after recent labs, however, was not taking vitamins/supplements previously.      Current Outpatient Medications   Medication Sig Dispense Refill    cetirizine (ZYRTEC) 10 MG tablet Take 1 tablet (10 mg total) by mouth.      colestipol (COLESTID) 1 gram tablet Take 2 tablets by mouth in the morning 180 tablet 5    empty container Misc Use as directed 1 each 3    HUMIRA PEN CITRATE FREE 40 MG/0.4 ML Inject the contents of 1 pen (40 mg total) under the skin every fourteen (14) days. 2 each 2    loperamide (IMODIUM) 2 mg capsule Take 2 capsules (4 mg total) by mouth two (2) times a day as needed for diarrhea. 180 capsule 6     No current facility-administered medications for this visit.       Nutrition History:     Dietary Restrictions: No known food allergies or food intolerances.     Gastrointestinal Issues: patient states that currently her only GI symptom is diarrhea, for which she takes Imodium     Hunger and Satiety: patient reports that her appetite has been low since starting medication; she often has to make herself eat and can't always finish the portion     Food Safety and Access: She did not report issues.     Diet Recall:   Time Intake   Breakfast Not a breakfast person; if she does have breakfast, may be a granola bar, chicken, or sausage biscuit   Snack (AM)    Lunch Snacks often at work: chips, candy, or may have fast food or delivery ; patient states that lunches generally need to be quick, as she doesn't have a long break   Snack (PM)    Dinner May have fast food again or cook something if she has the energy:  alfredo, chicken, shrimp, steak, beef, or whatever is available in the fridge    Snack (HS) Popcorn, few bags of chips, maybe a drink like juice or water     Food-Related History:    Beverages; juice, water (not enough), coffee or energy drink, occasional soda     Meal Schedule: typically eats twice per day; tries to fit in meals/snacks when possible at work; has thought about starting meal prepping     Nutrition Shakes: patient states that she used to be a huge Ensure fanatic around the time of her surgeries, but stopped drinking them at some point        Eating Behaviors:  Patient reports that she tends toward smaller portions due to low appetite and GI symptoms and will go back to  seconds if needed. Feels she eats at a normal pace.     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; she notes that she used to be more active (dancing, cheer) and is hoping to become more active again    Daily Estimated Nutritional Needs:  Energy:   [30-35 kcal/kg using last recorded weight, 53.9 kg (04/29/24 2211)]  Protein:   [1.0-1.2 gm/kg using last recorded weight, 53.9 kg (04/29/24 2211)]  Carbohydrate:   [no restriction]  Fluid:   [per MD team]    Nutrition Goals & Evaluation      Patient will implement IBD nutrition therapy principles as tolerated/appropriate  (New)  Patient will integrate strategies for increasing calorie intake as tolerated/as GI function permits  (New)  Patient will avoid further unintentional weight loss  (New)    Nutrition goals reviewed, relevant barriers identified and addressed: none evident. She is evaluated to have good willingness and ability to achieve nutrition goals.       Nutrition Assessment       Patient seen via video on 04/24/24. Malnutrition evaluation limited by nature of video visit, however, patient reports history of weight loss in recent years and difficulty increasing weight. Discussed medical nutrition therapy strategies as outlined below and addressed questions. Encouraged patient to reach out as needed between visits for additional support.    Nutrition Intervention      - Nutrition Education: IBD, increasing kcal. Education resource(s) or methods provided:: Handouts promoting nutrition intervention  Lists of foods recommended and foods not recommended Online resources  After visit summary with patient instructions  Contact information   - Nutrition Counseling: Goal setting Problem solving    Nutrition Plan:   Review handout on IBD nutrition therapy and integrate strategies as tolerated/appropriate  Aim for small, frequent intakes throughout the day as tolerated (e.g. every 3-4 hours when awake). Review handout on tips for increasing calories and integrate suggestions as GI tolerance permits. Consider setting an alarm or reminder if this helps you remember to eat at more routine intervals, particularly when appetite is low.   Consider adding Ensure/Boost or similar nutrition shake back into your routine as needed for additional calories. Breakfast may be a good time to consider this if you are often on the go and not particularly motivated to eat at that time.   Consider incorporating meal prep strategies gradually into your routine to help make it easier to have balanced meals on busy work days/evenings. Review article on this topic sent via MyChart for possible ideas.     Follow up will occur on June 12, 2024 via video.       Food/Nutrition-related history, Anthropometric measurements, Biochemical data, medical tests, procedures, Nutrition-focused physical findings, Patient understanding or compliance with intervention and recommendations , and Effectiveness of nutrition interventions will be assessed at time of follow-up.       Recommendations for Care Team :  No additional at present     Patient referred to outpatient nutrition services as part of therapy/treatment plans.      Time spent 26 minutes     The patient reports they are physically located in [place]  and is currently: not at home. I conducted a audio/video visit. I spent  8m 14s on the video call with the patient. I spent an additional 45 minutes on pre- and post-visit activities on the date of service .         I am  located on-site and the patient is located off-site for this visit.

## 2024-04-29 NOTE — Unmapped (Signed)
 [facility] has made a second and final attempt to reach this patient to refill the following medication:Humira.      We have left voicemails on the following phone numbers: [phone], have sent a MyChart message, have sent a text message to the following phone numbers: [phone], and have sent a Mychart questionnaire..    Dates contacted: 5/8 & 5/13  Last scheduled delivery: 04/04/24 (28 day supply)    The patient may be at risk of non-compliance with this medication. The patient should call the [facility] at [phone]  Option 4, then Option 2: Dermatology, Gastroenterology, Rheumatology to refill medication.    Canary Ceo   [facility] Specialty and Home Delivery Pharmacy Specialty Technician

## 2024-04-29 NOTE — Unmapped (Signed)
 Nutrition Plan:   Review handout on IBD nutrition therapy and integrate strategies as tolerated/appropriate  Aim for small, frequent intakes throughout the day as tolerated (e.g. every 3-4 hours when awake). Review handout on tips for increasing calories and integrate suggestions as GI tolerance permits. Consider setting an alarm or reminder if this helps you remember to eat at more routine intervals, particularly when appetite is low.   Consider adding Ensure/Boost or similar nutrition shake back into your routine as needed for additional calories. Breakfast may be a good time to consider this if you are often on the go and not particularly motivated to eat at that time.   Consider incorporating meal prep strategies gradually into your routine to help make it easier to have balanced meals on busy work days/evenings. Review article on this topic sent via MyChart for possible ideas.

## 2024-05-01 NOTE — Unmapped (Signed)
 [facility] Specialty and Home Delivery Pharmacy Refill Coordination Note    Specialty Medication(s) to be Shipped:   Inflammatory Disorders: Humira    Other medication(s) to be shipped: No additional medications requested for fill at this time     Erica Henry, DOB: Jul 01, 2001  Phone: There are no phone numbers on file.      All above HIPAA information was verified with patient.     Was a Nurse, learning disability used for this call? No    Completed refill call assessment today to schedule patient's medication shipment from the [facility] Delivery [facility]  [phone]).  All relevant notes have been reviewed.     Specialty medication(s) and dose(s) confirmed: Regimen is correct and unchanged.   Changes to medications: Erica Henry reports no changes at this time.  Changes to insurance: No  New side effects reported not previously addressed with a pharmacist or physician: None reported  Questions for the pharmacist: No    Confirmed patient received a Conservation officer, historic buildings and a Surveyor, mining with first shipment. The patient will receive a drug information handout for each medication shipped and additional FDA Medication Guides as required.       DISEASE/MEDICATION-SPECIFIC INFORMATION        For patients on injectable medications: Patient currently has 1 doses left.  Next injection is scheduled for next week.    SPECIALTY MEDICATION ADHERENCE     Medication Adherence    Patient reported X missed doses in the last month: 0  Specialty Medication: HUMIRA(CF) PEN 40 mg/0.4 mL  Patient is on additional specialty medications: No              Were doses missed due to medication being on hold? No    Humira 40/0.4 mg/ml: 1 doses of medicine on hand        REFERRAL TO PHARMACIST     Referral to the pharmacist: Not needed      [facility]     Shipping address confirmed in Epic.     Cost and Payment: Patient has a $0 copay, payment information is not required.    Delivery Scheduled: Yes, Expected medication delivery date: 05/08/24. Medication will be delivered via UPS to the prescription address in Epic WAM.    Canary Ceo   [facility] Specialty and Home Delivery Pharmacy  Specialty Technician

## 2024-05-07 MED FILL — HUMIRA PEN CITRATE FREE 40 MG/0.4 ML: SUBCUTANEOUS | 28 days supply | Qty: 2 | Fill #2

## 2024-05-30 DIAGNOSIS — K50919 Crohn's disease, unspecified, with unspecified complications: Principal | ICD-10-CM

## 2024-05-30 MED ORDER — HUMIRA PEN CITRATE FREE 40 MG/0.4 ML
SUBCUTANEOUS | 5 refills | 28.00000 days | Status: CP
Start: 2024-05-30 — End: ?
  Filled 2024-06-10: qty 2, 28d supply, fill #0

## 2024-05-30 NOTE — Unmapped (Signed)
 Patient is requesting the following refill  Requested Prescriptions     Pending Prescriptions Disp Refills    HUMIRA PEN CITRATE FREE 40 MG/0.4 ML 2 each 2     Sig: Inject the contents of 1 pen (40 mg total) under the skin every fourteen (14) days.       Recent Visits  Date Type Provider Dept   04/03/24 Office Visit Herfarth, Mikle Pfeiffer, MD [facility]   Showing recent visits within past 365 days and meeting all other requirements  Future Appointments  No visits were found meeting these conditions.  Showing future appointments within next 365 days and meeting all other requirements           Encounter for refill request:    Colonoscopy:   Appointments which have been scheduled for you      Jun 12, 2024 10:15 AM  (Arrive by 10:00 AM)  RETURN VIDEO VISIT DIRECT LINK with Tawni CHRISTELLA Dart, RD/LDN  [facility] NUTRITION SERVICES GI MEDICINE [place]  Arrive at: This is a Video Visit [address]  [facility] [address]  [place] [address]  [phone]   A direct link will be sent to you by your provider at the time of your video appointment. Please do NOT go to the clinic.     For your video visit, you will need a computer with a working camera, speaker and microphone, a smartphone, or a tablet with internet access.              Lab Results   Component Value Date    WBC 6.8 04/03/2024    RBC 5.13 04/03/2024    HGB 11.3 04/03/2024    HCT 35.9 04/03/2024    PLT 317 04/03/2024    ALT 12 04/03/2024    AST 17 04/03/2024    ALKPHOS 51 04/03/2024    CRP <5.0 04/03/2024    CREATININE 0.74 04/03/2024       refills authorized x  

## 2024-06-05 NOTE — Unmapped (Signed)
 [facility] Specialty and Home Delivery Pharmacy Refill Coordination Note    Specialty Medication(s) to be Shipped:   Inflammatory Disorders: Humira    Other medication(s) to be shipped: No additional medications requested for fill at this time     Margurette Brener, DOB: March 19, 2001  Phone: There are no phone numbers on file.      All above HIPAA information was verified with patient.     Was a Nurse, learning disability used for this call? No    Completed refill call assessment today to schedule patient's medication shipment from the [facility] and [facility] Delivery Pharmacy  [phone]).  All relevant notes have been reviewed.     Specialty medication(s) and dose(s) confirmed: Regimen is correct and unchanged.   Changes to medications: Tomia reports no changes at this time.  Changes to insurance: No  New side effects reported not previously addressed with a pharmacist or physician: None reported  Questions for the pharmacist: No    Confirmed patient received a Conservation officer, historic buildings and a Surveyor, mining with first shipment. The patient will receive a drug information handout for each medication shipped and additional FDA Medication Guides as required.       DISEASE/MEDICATION-SPECIFIC INFORMATION        For patients on injectable medications: Patient currently has 1 doses left.  Next injection is scheduled for next week.    SPECIALTY MEDICATION ADHERENCE     Medication Adherence    Patient reported X missed doses in the last month: 0  Specialty Medication: HUMIRA(CF) PEN 40 mg/0.4 mL  Patient is on additional specialty medications: No              Were doses missed due to medication being on hold? No    Humira 40/0.4 mg/ml: 1 doses of medicine on hand           REFERRAL TO PHARMACIST     Referral to the pharmacist: Not needed      [facility]     Shipping address confirmed in Epic.     Cost and Payment: Patient has a $0 copay, payment information is not required.    Delivery Scheduled: Yes, Expected medication delivery date: 06/11/24. Medication will be delivered via UPS to the prescription address in Epic WAM.    [name]   [facility] Specialty and Home Delivery Pharmacy  Specialty Technician

## 2024-07-02 NOTE — Unmapped (Signed)
 [facility] Specialty and Home Delivery Pharmacy Refill Coordination Note    Erica Henry, DOB: 09-07-2001  Phone: There are no phone numbers on file.      All above HIPAA information was verified with patient.         07/01/2024     4:15 PM   Specialty Rx Medication Refill Questionnaire   Which Medications would you like refilled and shipped? Humira    Please list all current allergies: N/a   Have you missed any doses in the last 30 days? No   Have you had any changes to your medication(s) since your last refill? No   How much of each medication do you have remaining at home? (eg. number of tablets, injections, etc.) 1   If receiving an injectable medication, next injection date is 07/04/2024   Have you experienced any side effects in the last 30 days? No   Please enter the full address (street address, city, state, zip code) where you would like your medication(s) to be delivered to. [address], [place], [place] [address]   Please specify on which day you would like your medication(s) to arrive. Note: if you need your medication(s) within 3 days, please call the pharmacy to schedule your order at [phone]  07/08/2024   Has your insurance changed since your last refill? No   Would you like a pharmacist to call you to discuss your medication(s)? No   Do you require a signature for your package? (Note: if we are billing Medicare Part B or your order contains a controlled substance, we will require a signature) No   I have been provided my out of pocket cost for my medication and approve the pharmacy to charge the amount to my credit card on file. Yes         Completed refill call assessment today to schedule patient's medication shipment from the [facility] and Home Delivery Pharm[facility] [phone]).  All relevant notes have been reviewed.       Confirmed patient received a Conservation officer, historic buildings and a Surveyor, mining with first shipment. The patient will receive a drug information handout for each medication shipped and additional FDA Medication Guides as required.         REFERRAL TO PHARMACIST     Referral to the pharmacist: Not needed      [facility]     Shipping address confirmed in Epic.     Delivery Scheduled: Yes, Expected medication delivery date: 07/08/2024.     Medication will be delivered via UPS to the prescription address in Epic WAM.    Lamarr CHRISTELLA Dross   [facility] Specialty and Home Delivery Pharmacy Specialty Technician

## 2024-07-07 MED FILL — HUMIRA PEN CITRATE FREE 40 MG/0.4 ML: SUBCUTANEOUS | 28 days supply | Qty: 2 | Fill #1

## 2024-07-10 ENCOUNTER — Encounter
Admit: 2024-07-10 | Discharge: 2024-07-11 | Payer: BLUE CROSS/BLUE SHIELD | Attending: Registered" | Primary: Registered"

## 2024-07-10 NOTE — Unmapped (Signed)
 [facility] Outpatient Nutrition Services   Medical Nutrition Therapy Consultation       Visit Type:    Return Assessment    Referral Reason:    Referring diagnosis: Crohn's disease with complication, unspecified gastrointestinal tract location; referral note: Patient has problems gaining weight, Crohn's diease in remission, perhaps oral intake can be optimized         Erica Henry is a 23 y.o. female seen for medical nutrition therapy. Her active problem list, medication list, allergies, lab results, endoscopy procedure notes, and imaging were reviewed.      Her interim medical history is significant for penetrating ileal Crohn's disease, characterized by a terminal ileal stricture and intra-abdominal abscesses, laparoscopic ileocecectomy with end ileostomy (2022), laparoscopic take down of ostomy site with right hemicolectomy and side-to-side functional end-to-end ileocolic anastomosis (2022), iron deficiency anemia, severe protein calorie malnutrition (noted 2022), unspecified thalassemia.      Anthropometrics   Height: 165.1 cm (5' 5)   Weight: 53.9 kg (last documented weight 04/03/24); on 07/10/24, patient states that she checked weight a few days ago and found that she was 119 lb   Body mass index is 19.77 kg/m??.    Wt Readings from Last 5 Encounters:   04/03/24 53.9 kg (118 lb 12.8 oz)   06/29/23 54.4 kg (120 lb)   03/08/23 56.3 kg (124 lb 3.2 oz)   06/29/22 59 kg (130 lb)   02/06/22 59.9 kg (132 lb)        Usual body weight: patient reports that she used to weigh 135-140 lb (last weighed this around time of ileostomy surgeries per patient); she notes that lately she is scared to get on the scale, as she has a hard time putting on weight and has been weighing about 115-120 lb recently    Ideal Body Weight: 56.75 kg  Weight in (lb) to have BMI = 25: 149.9    Nutrition Risk Screening:   Food Insecurity: No Food Insecurity (09/22/2022)    Received from [facility]    Hunger Vital Sign     Within the past 12 months, you worried that your food would run out before you got the money to buy more.: Never true     Within the past 12 months, the food you bought just didn't last and you didn't have money to get more.: Never true        Nutrition Focused Physical Exam:    Nutrition Evaluation  Overall Impressions: Unable to perform Nutrition-Focused Physical Exam at this time due to (comment) (video visit) (07/14/24 1252)  Nutrition Designation: Normal weight (BMI 18.50 - 24.99 kg/m2) (07/14/24 1252)     Malnutrition Screening:   Unable to complete Malnutrition Assessment at this time due to (comment) (NFPE limited by video visit) (07/14/24 1252)    Biochemical Data, Medical Tests and Procedures:  All pertinent labs and imaging reviewed by Tawni CHRISTELLA Dart, RD/LDN at 12:55 PM 07/14/2024.    06/29/23- colonoscopy   Impression:     - Patent end-to-side ileo-colonic anastomosis,                          characterized by healthy appearing mucosa.                         - The examined portion of the ileum was normal.                         -  The entire examined colon is normal.                         - One 6 mm polyp in the sigmoid colon, removed with a                          cold snare. Resected and retrieved.     06/29/23- surgical pathology exam  Diagnosis   A: Sigmoid colon, biopsy:  -Colonic mucosa with lymphoid aggregate and mild goblet cell hyperplasia.         04/03/24- folate 5.2 ng/ml, vitamin B12 WNL, 25OH vitamin D 8.8 ng/ml, iron 26 micrograms/dl, iron saturation 9%, ferritin WNL, CRP WNL, sodium WNL, potassium WNL, eGFR WNL, BG WNL    07/10/24 nutrition visit: no new labs/imaging since last RD visit, per review of [place] records     Medications and Vitamin/Mineral Supplementation:   All nutritionally pertinent medications reviewed on 07/14/2024.     Nutritionally pertinent medications include: colestipol , Humira , loperamide      She is taking nutrition supplements. Patient reports that she has started prenatal vitamin and iron after recent labs, however, was not taking vitamins/supplements previously.  (Patient denies updates on 07/10/24)     Current Medications[1]    Nutrition History:     Dietary Restrictions: No known food allergies or food intolerances.     Gastrointestinal Issues: per patient on 07/10/24, not sure if I would call it diarrhea, but having softer stools lately (not runny); states that this has been her norm lately and that GI prescribed Imodium  for this; normally has to use the restroom right after eating      Hunger and Satiety: on 07/10/24, patient states that she gets hungry, but tends to stick to smaller portions when she eats      Food Safety and Access: She did not report issues.     Diet Recall:   Time Intake- yesterday 07/09/24   Breakfast Smoothie from [employer] and a [employer] (AM)    Lunch Skipped lunch and just had some chips/snacks    Snack (PM)    Dinner Tacos, chips, salsa    Snack (HS)      Food-Related History:  Additional 07/10/24 Updates:   Patient reports that above recall feels fairly representative of intakes of late. Some days may have smaller breakfast and larger lunch, or vice versa. States that she is doing better with drinking water lately. States that nutrition shakes can be expensive, so may have her family pick some up at [employer] or [employer] soon, as she prefers to buy them in bulk.        Eating Behaviors:  Not addressed this visit.     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; she notes that she used to be more active (dancing, cheer) and is hoping to become more active again; 07/10/24 update: patient reports that she has a plan to increase physical activity soon; states she will likely start with home workouts, since she is shy and doesn't prefer to exercise in public     Daily Estimated Nutritional Needs:  Energy:   [30-35 kcal/kg using last recorded weight, 53.9 kg (04/29/24 2211)]  Protein:   [1.0-1.2 gm/kg using last recorded weight, 53.9 kg (04/29/24 2211)]  Carbohydrate:   [no restriction]  Fluid:   [per MD team]  Nutrition Goals & Evaluation      Patient will implement IBD nutrition therapy principles as tolerated/appropriate  (ongoing)  Patient will integrate strategies for increasing calorie intake as tolerated/as GI function permits  (ongoing)  Patient will avoid further unintentional weight loss  (progressing)     Nutrition goals reviewed, relevant barriers identified and addressed: none evident. She is evaluated to have good willingness and ability to achieve nutrition goals.     Nutrition Assessment       Patient seen via video on 07/10/24 for nutrition follow-up. Weight trend appears to be stabilizing per report. Patient notes intake at meals somewhat limited and has not gotten nutrition shakes to have on hand yet as above. She is open to receiving handout on homemade smoothies, should this be helpful for days when intake is more limited. She reports that she was out of her routine for a lot of June, as she was staying out of state after her grandmother's passing, but is now home in [place] and is transitioning back into a routine. Questions addressed and patient encouraged to reach out as needed between visits for additional support.     Nutrition Intervention      - Nutrition Education: smoothies. Education resource(s) or methods provided:: Recipes supporting nutrition intervention   - Nutrition Counseling: Goal setting Problem solving    Nutrition Plan:   Review prior handout on IBD nutrition therapy and integrate strategies as tolerated/appropriate  Aim for small, frequent intakes throughout the day as tolerated (e.g. every 3-4 hours when awake). Review handout on tips for increasing calories and integrate suggestions as GI tolerance permits. Consider setting an alarm or reminder if this helps you remember to eat at more routine intervals, particularly when appetite is low.   Consider adding Ensure/Boost or similar nutrition shake back into your routine as needed for additional calories. Breakfast may be a good time to consider this if you are often on the go and not particularly motivated to eat at that time.   Review handout on high calorie smoothie recipes to consider trying as tolerated, if this is easier/more cost effective than commercial shakes  Consider incorporating meal prep strategies gradually into your routine (as schedule/life stressors allow) to help make it easier to have balanced meals on busy work days/evenings. Review article on this topic sent via MyChart (first visit) for possible ideas.     Follow up will occur on September 11, 2024 via video.       Food/Nutrition-related history, Anthropometric measurements, Biochemical data, medical tests, procedures, Nutrition-focused physical findings, Patient understanding or compliance with intervention and recommendations , and Effectiveness of nutrition interventions will be assessed at time of follow-up.       Recommendations for Care Team :  No additional at present     Patient referred to outpatient nutrition services as part of therapy/treatment plans.      Time spent 26 minutes     The patient reports they are physically located in [place]  and is currently: at home. I conducted a audio/video visit. I spent  28m 46s on the video call with the patient. I spent an additional 30 minutes on pre- and post-visit activities on the date of service .         I am located on-site and the patient is located off-site for this visit.                    [1]   Current Outpatient Medications  Medication Sig Dispense Refill    cetirizine (ZYRTEC) 10 MG tablet Take 1 tablet (10 mg total) by mouth.      colestipol  (COLESTID ) 1 gram tablet Take 2 tablets by mouth in the morning 180 tablet 5    empty container Misc Use as directed 1 each 3    HUMIRA  PEN CITRATE FREE 40 MG/0.4 ML Inject the contents of 1 pen (40 mg total) under the skin every fourteen (14) days. 2 each 5    loperamide  (IMODIUM ) 2 mg capsule Take 2 capsules (4 mg total) by mouth two (2) times a day as needed for diarrhea. 180 capsule 6     No current facility-administered medications for this visit.

## 2024-07-14 NOTE — Unmapped (Signed)
 Nutrition Plan:   Review prior handout on IBD nutrition therapy and integrate strategies as tolerated/appropriate  Aim for small, frequent intakes throughout the day as tolerated (e.g. every 3-4 hours when awake). Review handout on tips for increasing calories and integrate suggestions as GI tolerance permits. Consider setting an alarm or reminder if this helps you remember to eat at more routine intervals, particularly when appetite is low.   Consider adding Ensure/Boost or similar nutrition shake back into your routine as needed for additional calories. Breakfast may be a good time to consider this if you are often on the go and not particularly motivated to eat at that time.   Review handout on high calorie smoothie recipes to consider trying as tolerated, if this is easier/more cost effective than commercial shakes  Consider incorporating meal prep strategies gradually into your routine (as schedule/life stressors allow) to help make it easier to have balanced meals on busy work days/evenings. Review article on this topic sent via MyChart (first visit) for possible ideas.

## 2024-08-07 NOTE — Unmapped (Signed)
 [facility] Specialty and Home Delivery Pharmacy Refill Coordination Note    Erica Henry, DOB: 03-07-2001  Phone: There are no phone numbers on file.      All above HIPAA information was verified with patient.         08/06/2024     4:56 PM   Specialty Rx Medication Refill Questionnaire   Which Medications would you like refilled and shipped? Humira    Please list all current allergies: N/a   Have you missed any doses in the last 30 days? No   Have you had any changes to your medication(s) since your last refill? No   How much of each medication do you have remaining at home? (eg. number of tablets, injections, etc.) 1   If receiving an injectable medication, next injection date is 08/15/2024   Have you experienced any side effects in the last 30 days? No   Please enter the full address (street address, city, state, zip code) where you would like your medication(s) to be delivered to. [address], [place] [address]   Please specify on which day you would like your medication(s) to arrive. Note: if you need your medication(s) within 3 days, please call the pharmacy to schedule your order at [phone]  08/13/2024   Has your insurance changed since your last refill? No   Would you like a pharmacist to call you to discuss your medication(s)? No   Do you require a signature for your package? (Note: if we are billing Medicare Part B or your order contains a controlled substance, we will require a signature) No   I have been provided my out of pocket cost for my medication and approve the pharmacy to charge the amount to my credit card on file. Yes         Completed refill call assessment today to schedule patient's medication shipment from the [facility] and Home Delivery Pharmacy [phone]).  All relevant notes have been reviewed.       Confirmed patient received a Conservation officer, historic buildings and a Surveyor, mining with first shipment. The patient will receive a drug information handout for each medication shipped and additional FDA Medication Guides as required.         REFERRAL TO PHARMACIST     Referral to the pharmacist: Not needed      [facility]     Shipping address confirmed in Epic.     Delivery Scheduled: Yes, Expected medication delivery date: 08/13/24.     Medication will be delivered via UPS to the prescription address in Epic WAM.    Erica Henry   [facility] Specialty and Home Delivery Pharmacy Specialty Technician

## 2024-08-12 MED FILL — HUMIRA PEN CITRATE FREE 40 MG/0.4 ML: SUBCUTANEOUS | 28 days supply | Qty: 2 | Fill #2

## 2024-09-08 NOTE — Unmapped (Signed)
 [facility] Specialty and Home Delivery Pharmacy Refill Coordination Note    Specialty Medication(s) to be Shipped:   Inflammatory Disorders: Humira     Other medication(s) to be shipped: No additional medications requested for fill at this time    Specialty Medications not needed at this time: N/A     Erica Henry, DOB: [birth year]  Phone: There are no phone numbers on file.      All above HIPAA information was verified with patient.     Was a Nurse, learning disability used for this call? No    Completed refill call assessment today to schedule patient's medication shipment from the [facility] and Home Delivery Pharmacy  [phone]).  All relevant notes have been reviewed.     Specialty medication(s) and dose(s) confirmed: Regimen is correct and unchanged.   Changes to medications: Erica Henry reports no changes at this time.  Changes to insurance: No  New side effects reported not previously addressed with a pharmacist or physician: None reported  Questions for the pharmacist: No    Confirmed patient received a Conservation officer, historic buildings and a Surveyor, mining with first shipment. The patient will receive a drug information handout for each medication shipped and additional FDA Medication Guides as required.       DISEASE/MEDICATION-SPECIFIC INFORMATION        For patients on injectable medications: Next injection is scheduled for 9/24.    SPECIALTY MEDICATION ADHERENCE     Medication Adherence    Patient reported X missed doses in the last month: 0  Specialty Medication: HUMIRA (CF) PEN 40 mg/0.4 mL  Patient is on additional specialty medications: No              Were doses missed due to medication being on hold? No    Humira  40/0.4 mg/ml: 1 doses of medicine on hand        REFERRAL TO PHARMACIST     Referral to the pharmacist: Not needed      [facility]     Shipping address confirmed in Epic.     Cost and Payment: Patient has a $0 copay, payment information is not required.    Delivery Scheduled: Yes, Expected medication delivery date: 09/18/24.     Medication will be delivered via UPS to the prescription address in Epic WAM.    Erica Henry   [facility] Special[facility] and Home Delivery Pharmacy  Specialty Technician

## 2024-09-11 ENCOUNTER — Encounter
Admit: 2024-09-11 | Discharge: 2024-09-12 | Payer: BLUE CROSS/BLUE SHIELD | Attending: Registered" | Primary: Registered"

## 2024-09-11 NOTE — Unmapped (Unsigned)
 Brief Nutrition Note:     Patient scheduled for a video visit at 11:00 AM on 09/11/24. Appeared that patient joined video visit, however, RD got message waiting for others to connect despite several attempts to start visit. Attempted to call patient to troubleshoot, but call went to voicemail. Left voicemail and also sent MyChart message with explanation and rescheduling/callback info if needed. Attempted to send link again after noticed patient had disconnected, but did not appear patient rejoined. Patient later replied to MyChart message saying that she was at work with unstable video connection. Patient requested rescheduling and RD replied to message. Awaiting further reply from patient regarding rescheduling. Does not appear that patient has rescheduled yet per appointment list (as of 09/15/24 review). No MNT provided this visit.     Unable to reschedule or cancel 09/11/24 video encounter due to arrived/complete status listed in Epic.     Tawni Dart, RD, LDN   Clinical Dietitian II   [facility] at [facility] Methods: ***  Usual Food Choices: ***   Meal Schedule:  ***        Eating Behaviors:  Overeating: {Overeating:60150}  Emotional Eating: {Emotional Eating:60146}  Grazing: {Grazing:60148}  Fast Eating: {Fast Eating:60147}  Nighttime Eating: {Nighttime Eating:60149}    Physical Activity:  {Physical Activity:60169}    Daily Estimated Nutritional Needs:  Energy:   kcals [  using  ,  ]  Protein:   gm [  using  ,  ]  Carbohydrate:   [ ]   Fluid:   [ ]   {Other Nutrients:60227}    Nutrition Goals & Evaluation      ***  ({MNT Goal Progress:77446})  ***  ({MNT Goal Progress:77446})  ***  ({MNT Goal Progress:77446})  ***  ({MNT Goal Progress:77446})  ***  ({MNT Goal Progress:77446})    Nutrition goals reviewed, relevant barriers identified and addressed: {MNT Barriers to Care:77905}. She is evaluated to have {DESC; GOOD/FAIR/POOR:18685} willingness and ability to achieve nutrition goals.       Nutrition Assessment       ***1101    Nutrition Intervention      {MNT Intervention:72370}    Nutrition Plan:   ***    Follow up will occur in ***.       {MNT Monitoring at Follow-up:76793} will be assessed at time of follow-up.       Recommendations for {ONS Recommendations:77904}:  ***     {ONS Referral List:112387}      {MNT Time Spent Disclaimer (In-person or Virtual):78573}                   [1]   Current Outpatient Medications   Medication Sig Dispense Refill    cetirizine (ZYRTEC) 10 MG tablet Take 1 tablet (10 mg total) by mouth.      colestipol  (COLESTID ) 1 gram tablet Take 2 tablets by mouth in the morning 180 tablet 5    empty container Misc Use as directed 1 each 3    HUMIRA  PEN CITRATE FREE 40 MG/0.4 ML Inject the contents of 1 pen (40 mg total) under the skin every fourteen (14) days. 2 each 5    loperamide  (IMODIUM ) 2 mg capsule Take 2 capsules (4 mg total) by mouth two (2) times a day as needed for diarrhea. 180 capsule 6     No current facility-administered medications for this visit.

## 2024-09-17 MED FILL — HUMIRA PEN CITRATE FREE 40 MG/0.4 ML: SUBCUTANEOUS | 28 days supply | Qty: 2 | Fill #3

## 2024-10-17 NOTE — Progress Notes (Signed)
 I spoke with Erica Henry in regards to the next Humira  shipment and she declined due to having 2 boxes on hand. [name] has requested that we give her a call back in about 3 weeks, I will adjust the refill call.

## 2024-11-12 NOTE — Progress Notes (Signed)
 [facility] Specialty and Home Delivery Pharmacy Refill Coordination Note    Erica Henry, DOB: 09/04/2001  Phone: There are no phone numbers on file.      All above HIPAA information was verified with patient.         11/11/2024     7:08 PM   Specialty Rx Medication Refill Questionnaire   Which Medications would you like refilled and shipped? Humira    Please list all current allergies: N/A   Have you missed any doses in the last 30 days? No   Have you had any changes to your medication(s) since your last refill? No   How much of each medication do you have remaining at home? (eg. number of tablets, injections, etc.) 1   If receiving an injectable medication, next injection date is 11/14/2024   Have you experienced any side effects in the last 30 days? No   Please enter the full address (street address, city, state, zip code) where you would like your medication(s) to be delivered to. [address] [place] [address] [address]   Please specify on which day you would like your medication(s) to arrive. Note: if you need your medication(s) within 3 days, please call the pharmacy to schedule your order at [phone]  11/19/2024   Has your insurance changed since your last refill? No   Would you like a pharmacist to call you to discuss your medication(s)? No   Do you require a signature for your package? (Note: if we are billing Medicare Part B or your order contains a controlled substance, we will require a signature) No   I have been provided my out of pocket cost for my medication and approve the pharmacy to charge the amount to my credit card on file. Yes         Completed refill call assessment today to schedule patient's medication shipment from the [facility] and Home Delivery Pharmacy [phone]).  All relevant notes have been reviewed.       Confirmed patient received a Conservation Officer, Historic Buildings and a Surveyor, Mining with first shipment. The patient will receive a drug information handout for each medication shipped and additional FDA Medication Guides as required.         REFERRAL TO PHARMACIST     Referral to the pharmacist: Not needed      [facility]     Shipping address confirmed in Epic.     Delivery Scheduled: Yes, Expected medication delivery date: 11/19/2024.     Medication will be delivered via UPS to the prescription address in Epic WAM.    Erica Henry CHRISTELLA Dross   [facility] Specialty and Home Delivery Pharmacy Specialty Technician

## 2024-11-18 MED FILL — HUMIRA PEN CITRATE FREE 40 MG/0.4 ML: SUBCUTANEOUS | 28 days supply | Qty: 2 | Fill #4

## 2024-12-16 MED FILL — HUMIRA PEN CITRATE FREE 40 MG/0.4 ML: SUBCUTANEOUS | 28 days supply | Qty: 2 | Fill #5

## 2024-12-16 NOTE — Progress Notes (Signed)
 [facility] Clinical Assessment & Refill Coordination Note    Sent MyChart message for pt to utilize once she receives 2026 insurance card    [place], DOB: 07-09-2001  Phone: There are no phone numbers on file.    All above HIPAA information was verified with patient.     Was a nurse, learning disability used for this call? No    Specialty Medication(s):   Inflammatory Disorders: Humira      Current Medications[1]     Changes to medications: [facility] reports no changes at this time.    Medication list has been reviewed and updated in Epic: Yes    Allergies[2]    Changes to allergies: No    Allergies have been reviewed and updated in Epic: Yes    SPECIALTY MEDICATION ADHERENCE     Humira  40 mg/0.16mL : 0 doses of medicine on hand   Specialty medication is an injection or given on a cycle: Yes, Next injection is scheduled for 12/31.    Medication Adherence    Patient reported X missed doses in the last month: 0  Specialty Medication: humira   Patient is on additional specialty medications: No  Informant: patient          Specialty medication(s) dose(s) confirmed: Regimen is correct and unchanged.     Are there any concerns with adherence? No    Adherence counseling provided? Not needed    CLINICAL MANAGEMENT AND INTERVENTION      Clinical Benefit Assessment:    Do you feel the medicine is effective or helping your condition? Yes    Clinical Benefit counseling provided? Not needed    Adverse Effects Assessment:    Are you experiencing any side effects? No    Are you experiencing difficulty administering your medicine? No    Quality of Life Assessment:    Quality of Life    Rheumatology  Oncology  Dermatology  Cystic Fibrosis          How many days over the past month did your Crohn's  keep you from your normal activities? For example, brushing your teeth or getting up in the morning. 0    Have you discussed this with your provider? Not needed    Acute Infection Status:    Acute infections noted within Epic:  No active infections    Patient reported infection: None    Therapy Appropriateness:    Is the medication and dose appropriate considering the patient???s diagnosis, treatment, and disease journey, comorbidities, medical history, current medications, allergies, therapeutic goals, self-administration ability, and access barriers? Yes, therapy is appropriate and should be continued     Clinical Intervention:    Was an intervention completed as part of this clinical assessment? No    DISEASE/MEDICATION-SPECIFIC INFORMATION      N/A    Chronic Inflammatory Diseases: Have you experienced any flares in the last month? No  Has this been reported to your provider? Not applicable    PATIENT SPECIFIC NEEDS     Does the patient have any physical, cognitive, or cultural barriers? No    Is the patient high risk? No    Does the patient require physician intervention or other additional services (i.e., nutrition, smoking cessation, social work)? No    Does the patient have an additional or emergency contact listed in their chart? Yes    SOCIAL DETERMINANTS OF HEALTH     At the [facility], we have learned that life circumstances - like trouble affording  food, housing, utilities, or transportation can affect the health of many of our patients.   That is why we wanted to ask: are you currently experiencing any life circumstances that are negatively impacting your health and/or quality of life? No    Social Drivers of Health     Food Insecurity: No Food Insecurity (09/22/2022)    Received from [facility]    Hunger Vital Sign     Within the past 12 months, you worried that your food would run out before you got the money to buy more.: Never true     Within the past 12 months, the food you bought just didn't last and you didn't have money to get more.: Never true   Tobacco Use: Low Risk (04/03/2024)    Patient History     Smoking Tobacco Use: Never     Smokeless Tobacco Use: Never     Passive Exposure: Not on file Transportation Needs: No Transportation Needs (09/26/2022)    Received from [facility] - Transportation     Lack of Transportation (Medical): No     Lack of Transportation (Non-Medical): No   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)    Received from [facility]    AUDIT-C     Q1: How often do you have a drink containing alcohol?: 2-4 times a month     Q2: How many drinks containing alcohol do you have on a typical day when you are drinking?: 3 or 4     Q3: How often do you have six or more drinks on one occasion?: Never   Housing: Low Risk  (09/26/2022)    Received from [facility] Stability Vital Sign     Unable to Pay for Housing in the Last Year: No     Number of Places Lived in the Last Year: 1     Unstable Housing in the Last Year: No   Physical Activity: Insufficiently Active (09/26/2022)    Received from [facility]    Exercise Vital Sign     On average, how many days per week do you engage in moderate to strenuous exercise (like a brisk walk)?: 1 day     On average, how many minutes do you engage in exercise at this level?: 30 min   Utilities: Not on file   Stress: No Stress Concern Present (09/26/2022)    Received from [facility] - Occupational Stress Questionnaire     Feeling of Stress : Only a little   Interpersonal Safety: Not At Risk (04/03/2024)    Interpersonal Safety     Unsafe Where You Currently Live: No     Physically Hurt by Anyone: No     Abused by Anyone: No   Substance Use: Not on file (10/24/2023)   Intimate Partner Violence: Not At Risk (09/26/2022)    Received from [facility]    Humiliation, Afraid, Rape, and Kick questionnaire     Within the last year, have you been afraid of your partner or ex-partner?: No     Within the last year, have you been humiliated or emotionally abused in other ways by your partner or ex-partner?: No     Within the last year, have you been kicked, hit, slapped, or otherwise physically hurt by your partner or ex-partner?: No     Within the last year, have you been raped or forced to have any kind  of sexual activity by your partner or ex-partner?: No   Social Connections: Moderately Integrated (09/26/2022)    Received from [facility]    Social Connection and Isolation Panel     In a typical week, how many times do you talk on the phone with family, friends, or neighbors?: More than three times a week     How often do you get together with friends or relatives?: Twice a week     How often do you attend church or religious services?: More than 4 times per year     Do you belong to any clubs or organizations such as church groups, unions, fraternal or athletic groups, or school groups?: Yes     How often do you attend meetings of the clubs or organizations you belong to?: 1 to 4 times per year     Are you married, widowed, divorced, separated, never married, or living with a partner?: Never married   Physicist, Medical Strain: Low Risk  (09/26/2022)    Received from [employer]    Overall Financial Resource Strain (CARDIA)     Difficulty of Paying Living Expenses: Not very hard   Health Literacy: Not on file   Internet Connectivity: Not on file       Would you be willing to receive help with any of the needs that you have identified today? Not applicable       SHIPPING     Specialty Medication(s) to be Shipped:   Inflammatory Disorders: Humira     Other medication(s) to be shipped: No additional medications requested for fill at this time    Specialty Medications not needed at this time: N/A     Changes to insurance: No    Cost and Payment: Patient has a $0 copay, payment information is not required.    Delivery Scheduled: Yes, Expected medication delivery date: 12/31.     Medication will be delivered via UPS to the confirmed prescription address in [facility].    The patient will receive a drug information handout for each medication shipped and additional FDA Medication Guides as required.  Verified that patient has previously received a Conservation Officer, Historic Buildings and a Surveyor, Mining.    The patient or caregiver noted above participated in the development of this care plan and knows that they can request review of or adjustments to the care plan at any time.      All of the patient's questions and concerns have been addressed.    Harlene DELENA Elder, PharmD   [facility] Specialty Pharmacist       [1]   Current Outpatient Medications   Medication Sig Dispense Refill    cetirizine (ZYRTEC) 10 MG tablet Take 1 tablet (10 mg total) by mouth.      colestipol  (COLESTID ) 1 gram tablet Take 2 tablets by mouth in the morning 180 tablet 5    empty container Misc Use as directed 1 each 3    HUMIRA  PEN CITRATE FREE 40 MG/0.4 ML Inject the contents of 1 pen (40 mg total) under the skin every fourteen (14) days. 2 each 5    loperamide  (IMODIUM ) 2 mg capsule Take 2 capsules (4 mg total) by mouth two (2) times a day as needed for diarrhea. 180 capsule 6     No current facility-administered medications for this visit.   [2] No Known Allergies

## 2024-12-22 DIAGNOSIS — K50919 Crohn's disease, unspecified, with unspecified complications: Principal | ICD-10-CM

## 2024-12-22 MED ORDER — HUMIRA PEN CITRATE FREE 40 MG/0.4 ML
SUBCUTANEOUS | 5 refills | 28.00000 days
Start: 2024-12-22 — End: ?

## 2024-12-23 MED ORDER — HUMIRA PEN CITRATE FREE 40 MG/0.4 ML
SUBCUTANEOUS | 0 refills | 28.00000 days | Status: CP
Start: 2024-12-23 — End: ?

## 2024-12-23 NOTE — Progress Notes (Signed)
 The [facility] has received the prescription(s) for Humira . The triage team has completed the investigation and has determined that the patient is NOT able to fill this medication at the [facility] due to insurance plan limitations. Please see additional information below and re-route the prescription to the preferred pharmacy. Thank you.    PA Required: Yes    Specialty Pharmacy Required:  [facility] (p) [phone]    Please note that due to Board of Pharmacy restrictions, we are unable to forward this prescription to a different pharmacy. The provider will need to send a new prescription.

## 2024-12-23 NOTE — Telephone Encounter (Signed)
 Received med refill request for Humira  Pen 40 mg injection.      Last ov:  04/03/24  Return in about 9 months (around 01/03/2025)   Patient notified of need to schedule a follow-up visit.    Monitoring labs:  04/03/24  Patient notified of need to update labs;  Humira  refill sent in interim to avoid lapse in therapy.

## 2024-12-24 DIAGNOSIS — K50919 Crohn's disease, unspecified, with unspecified complications: Principal | ICD-10-CM

## 2024-12-24 MED ORDER — HUMIRA PEN CITRATE FREE 40 MG/0.4 ML
SUBCUTANEOUS | 0 refills | 28.00000 days | Status: CP
Start: 2024-12-24 — End: ?

## 2024-12-24 NOTE — Telephone Encounter (Signed)
 New insurance requiring fill with [facility]. Humira  Rx x 1 month re-routed.    Of note, patient still in need of labs and follow up visit with provider so no additional refills sent.    Dewayne Motley, PharmD, BCPS, CPP  Clinical Pharmacist Practitioner, GI/IBD  [phone]

## 2024-12-24 NOTE — Progress Notes (Signed)
 Specialty Medication(s): Humira     [facility] has been dis-enrolled from the [facility] specialty pharmacy services as a result of a pharmacy change resulting from insurance limitations. The insurance company requires the patient fill at [facility]    Additional information provided to the patient: n/a    [name] [name], PharmD  [facility] Specialty Pharmacist

## 2024-12-25 DIAGNOSIS — K50919 Crohn's disease, unspecified, with unspecified complications: Principal | ICD-10-CM

## 2024-12-25 MED ORDER — HUMIRA PEN CITRATE FREE 40 MG/0.4 ML
SUBCUTANEOUS | 0 refills | 28.00000 days | Status: CP
Start: 2024-12-25 — End: ?

## 2025-01-19 DIAGNOSIS — K50919 Crohn's disease, unspecified, with unspecified complications: Principal | ICD-10-CM

## 2025-01-19 MED ORDER — YUSIMRY(CF) PEN 40 MG/0.8 ML SUBCUTANEOUS PEN INJECTOR
SUBCUTANEOUS | 5 refills | 0.00000 days | Status: CP
Start: 2025-01-19 — End: ?

## 2025-01-22 DIAGNOSIS — K50919 Crohn's disease, unspecified, with unspecified complications: Principal | ICD-10-CM
